# Patient Record
Sex: Female | Born: 1960
Health system: Southern US, Community
[De-identification: ages and names within clinical notes are randomized; demographics above are authoritative.]

## PROBLEM LIST (undated history)

## (undated) DIAGNOSIS — Z972 Presence of dental prosthetic device (complete) (partial): Secondary | ICD-10-CM

## (undated) DIAGNOSIS — Z860101 Personal history of adenomatous and serrated colon polyps: Secondary | ICD-10-CM

## (undated) DIAGNOSIS — R112 Nausea with vomiting, unspecified: Secondary | ICD-10-CM

## (undated) DIAGNOSIS — K219 Gastro-esophageal reflux disease without esophagitis: Secondary | ICD-10-CM

## (undated) DIAGNOSIS — Z973 Presence of spectacles and contact lenses: Secondary | ICD-10-CM

## (undated) DIAGNOSIS — Z8659 Personal history of other mental and behavioral disorders: Secondary | ICD-10-CM

## (undated) DIAGNOSIS — G5603 Carpal tunnel syndrome, bilateral upper limbs: Secondary | ICD-10-CM

## (undated) DIAGNOSIS — F4000 Agoraphobia, unspecified: Secondary | ICD-10-CM

## (undated) DIAGNOSIS — I10 Essential (primary) hypertension: Secondary | ICD-10-CM

## (undated) DIAGNOSIS — R3915 Urgency of urination: Secondary | ICD-10-CM

## (undated) DIAGNOSIS — R319 Hematuria, unspecified: Secondary | ICD-10-CM

## (undated) DIAGNOSIS — Z86010 Personal history of colonic polyps: Secondary | ICD-10-CM

## (undated) DIAGNOSIS — F411 Generalized anxiety disorder: Secondary | ICD-10-CM

## (undated) DIAGNOSIS — Z9889 Other specified postprocedural states: Secondary | ICD-10-CM

## (undated) DIAGNOSIS — Z86018 Personal history of other benign neoplasm: Secondary | ICD-10-CM

## (undated) DIAGNOSIS — Z87820 Personal history of traumatic brain injury: Secondary | ICD-10-CM

## (undated) DIAGNOSIS — R350 Frequency of micturition: Secondary | ICD-10-CM

## (undated) DIAGNOSIS — D573 Sickle-cell trait: Secondary | ICD-10-CM

## (undated) DIAGNOSIS — H5440 Blindness, one eye, unspecified eye: Secondary | ICD-10-CM

## (undated) DIAGNOSIS — F329 Major depressive disorder, single episode, unspecified: Secondary | ICD-10-CM

## (undated) DIAGNOSIS — M1990 Unspecified osteoarthritis, unspecified site: Secondary | ICD-10-CM

## (undated) DIAGNOSIS — K08109 Complete loss of teeth, unspecified cause, unspecified class: Secondary | ICD-10-CM

## (undated) DIAGNOSIS — F32A Depression, unspecified: Secondary | ICD-10-CM

## (undated) DIAGNOSIS — G894 Chronic pain syndrome: Secondary | ICD-10-CM

## (undated) DIAGNOSIS — N201 Calculus of ureter: Secondary | ICD-10-CM

## (undated) HISTORY — DX: Gastro-esophageal reflux disease without esophagitis: K21.9

## (undated) HISTORY — PX: TOTAL ABDOMINAL HYSTERECTOMY W/ BILATERAL SALPINGOOPHORECTOMY: SHX83

## (undated) HISTORY — DX: Depression, unspecified: F32.A

## (undated) HISTORY — DX: Major depressive disorder, single episode, unspecified: F32.9

## (undated) HISTORY — PX: COLONOSCOPY: SHX174

## (undated) HISTORY — PX: ESOPHAGOGASTRODUODENOSCOPY: SHX1529

## (undated) HISTORY — PX: TUBAL LIGATION: SHX77

## (undated) HISTORY — PX: OTHER SURGICAL HISTORY: SHX169

## (undated) HISTORY — DX: Chronic pain syndrome: G89.4

## (undated) HISTORY — DX: Carpal tunnel syndrome, bilateral upper limbs: G56.03

## (undated) HISTORY — DX: Sickle-cell trait: D57.3

## (undated) HISTORY — PX: CATARACT EXTRACTION: SUR2

---

## 1998-06-05 ENCOUNTER — Other Ambulatory Visit: Admission: RE | Admit: 1998-06-05 | Discharge: 1998-06-05 | Payer: Self-pay | Admitting: Obstetrics

## 1998-11-01 ENCOUNTER — Encounter: Payer: Self-pay | Admitting: Emergency Medicine

## 1998-11-01 ENCOUNTER — Emergency Department (HOSPITAL_COMMUNITY): Admission: EM | Admit: 1998-11-01 | Discharge: 1998-11-01 | Payer: Self-pay | Admitting: Emergency Medicine

## 1999-08-13 ENCOUNTER — Emergency Department (HOSPITAL_COMMUNITY): Admission: EM | Admit: 1999-08-13 | Discharge: 1999-08-13 | Payer: Self-pay | Admitting: *Deleted

## 1999-12-24 HISTORY — PX: HEAD & NECK WOUND REPAIR / CLOSURE: SUR1142

## 2000-06-16 ENCOUNTER — Inpatient Hospital Stay (HOSPITAL_COMMUNITY): Admission: AD | Admit: 2000-06-16 | Discharge: 2000-06-16 | Payer: Self-pay | Admitting: Obstetrics

## 2000-06-17 ENCOUNTER — Other Ambulatory Visit: Admission: RE | Admit: 2000-06-17 | Discharge: 2000-06-17 | Payer: Self-pay | Admitting: Obstetrics

## 2000-06-19 ENCOUNTER — Ambulatory Visit (HOSPITAL_COMMUNITY): Admission: RE | Admit: 2000-06-19 | Discharge: 2000-06-19 | Payer: Self-pay | Admitting: Obstetrics

## 2000-06-19 ENCOUNTER — Encounter: Payer: Self-pay | Admitting: Obstetrics

## 2001-03-21 ENCOUNTER — Encounter: Payer: Self-pay | Admitting: Emergency Medicine

## 2001-03-21 ENCOUNTER — Emergency Department (HOSPITAL_COMMUNITY): Admission: EM | Admit: 2001-03-21 | Discharge: 2001-03-22 | Payer: Self-pay | Admitting: Emergency Medicine

## 2001-03-25 ENCOUNTER — Other Ambulatory Visit: Admission: RE | Admit: 2001-03-25 | Discharge: 2001-03-25 | Payer: Self-pay | Admitting: Obstetrics

## 2001-08-07 ENCOUNTER — Encounter: Payer: Self-pay | Admitting: Internal Medicine

## 2001-08-07 ENCOUNTER — Ambulatory Visit (HOSPITAL_COMMUNITY): Admission: RE | Admit: 2001-08-07 | Discharge: 2001-08-07 | Payer: Self-pay | Admitting: Internal Medicine

## 2002-04-19 ENCOUNTER — Ambulatory Visit (HOSPITAL_COMMUNITY): Admission: RE | Admit: 2002-04-19 | Discharge: 2002-04-19 | Payer: Self-pay | Admitting: Family Medicine

## 2003-03-15 ENCOUNTER — Emergency Department (HOSPITAL_COMMUNITY): Admission: EM | Admit: 2003-03-15 | Discharge: 2003-03-15 | Payer: Self-pay | Admitting: Emergency Medicine

## 2003-03-15 ENCOUNTER — Encounter: Payer: Self-pay | Admitting: Emergency Medicine

## 2003-06-15 ENCOUNTER — Emergency Department (HOSPITAL_COMMUNITY): Admission: EM | Admit: 2003-06-15 | Discharge: 2003-06-15 | Payer: Self-pay | Admitting: Emergency Medicine

## 2003-11-08 ENCOUNTER — Emergency Department (HOSPITAL_COMMUNITY): Admission: AD | Admit: 2003-11-08 | Discharge: 2003-11-08 | Payer: Self-pay | Admitting: Family Medicine

## 2004-01-14 ENCOUNTER — Inpatient Hospital Stay (HOSPITAL_COMMUNITY): Admission: AD | Admit: 2004-01-14 | Discharge: 2004-01-14 | Payer: Self-pay | Admitting: Obstetrics

## 2004-05-01 ENCOUNTER — Emergency Department (HOSPITAL_COMMUNITY): Admission: EM | Admit: 2004-05-01 | Discharge: 2004-05-01 | Payer: Self-pay

## 2004-05-14 ENCOUNTER — Ambulatory Visit (HOSPITAL_COMMUNITY): Admission: RE | Admit: 2004-05-14 | Discharge: 2004-05-14 | Payer: Self-pay | Admitting: Obstetrics

## 2004-08-21 ENCOUNTER — Emergency Department (HOSPITAL_COMMUNITY): Admission: EM | Admit: 2004-08-21 | Discharge: 2004-08-21 | Payer: Self-pay | Admitting: Emergency Medicine

## 2004-10-23 ENCOUNTER — Emergency Department (HOSPITAL_COMMUNITY): Admission: EM | Admit: 2004-10-23 | Discharge: 2004-10-23 | Payer: Self-pay | Admitting: Family Medicine

## 2005-01-25 ENCOUNTER — Emergency Department (HOSPITAL_COMMUNITY): Admission: EM | Admit: 2005-01-25 | Discharge: 2005-01-25 | Payer: Self-pay | Admitting: Family Medicine

## 2005-03-07 ENCOUNTER — Encounter (INDEPENDENT_AMBULATORY_CARE_PROVIDER_SITE_OTHER): Payer: Self-pay | Admitting: *Deleted

## 2005-03-07 ENCOUNTER — Ambulatory Visit (HOSPITAL_COMMUNITY): Admission: RE | Admit: 2005-03-07 | Discharge: 2005-03-07 | Payer: Self-pay | Admitting: Orthopedic Surgery

## 2005-03-07 ENCOUNTER — Ambulatory Visit (HOSPITAL_BASED_OUTPATIENT_CLINIC_OR_DEPARTMENT_OTHER): Admission: RE | Admit: 2005-03-07 | Discharge: 2005-03-07 | Payer: Self-pay | Admitting: Orthopedic Surgery

## 2005-03-07 HISTORY — PX: OTHER SURGICAL HISTORY: SHX169

## 2005-05-23 ENCOUNTER — Ambulatory Visit (HOSPITAL_COMMUNITY): Admission: RE | Admit: 2005-05-23 | Discharge: 2005-05-23 | Payer: Self-pay | Admitting: Obstetrics

## 2005-07-18 ENCOUNTER — Emergency Department (HOSPITAL_COMMUNITY): Admission: EM | Admit: 2005-07-18 | Discharge: 2005-07-18 | Payer: Self-pay | Admitting: Emergency Medicine

## 2005-08-05 ENCOUNTER — Emergency Department (HOSPITAL_COMMUNITY): Admission: EM | Admit: 2005-08-05 | Discharge: 2005-08-05 | Payer: Self-pay | Admitting: Family Medicine

## 2005-10-14 ENCOUNTER — Inpatient Hospital Stay (HOSPITAL_COMMUNITY): Admission: AD | Admit: 2005-10-14 | Discharge: 2005-10-14 | Payer: Self-pay | Admitting: Obstetrics

## 2005-10-16 ENCOUNTER — Inpatient Hospital Stay (HOSPITAL_COMMUNITY): Admission: AD | Admit: 2005-10-16 | Discharge: 2005-10-16 | Payer: Self-pay | Admitting: Obstetrics and Gynecology

## 2005-10-18 ENCOUNTER — Ambulatory Visit (HOSPITAL_COMMUNITY): Admission: RE | Admit: 2005-10-18 | Discharge: 2005-10-18 | Payer: Self-pay | Admitting: Obstetrics and Gynecology

## 2005-11-05 ENCOUNTER — Encounter (INDEPENDENT_AMBULATORY_CARE_PROVIDER_SITE_OTHER): Payer: Self-pay | Admitting: Specialist

## 2005-11-05 ENCOUNTER — Ambulatory Visit: Payer: Self-pay | Admitting: *Deleted

## 2005-11-05 ENCOUNTER — Other Ambulatory Visit: Admission: RE | Admit: 2005-11-05 | Discharge: 2005-11-05 | Payer: Self-pay | Admitting: *Deleted

## 2005-12-05 ENCOUNTER — Ambulatory Visit: Payer: Self-pay | Admitting: *Deleted

## 2005-12-23 HISTORY — PX: ENDOMETRIAL ABLATION: SHX621

## 2005-12-29 ENCOUNTER — Emergency Department (HOSPITAL_COMMUNITY): Admission: EM | Admit: 2005-12-29 | Discharge: 2005-12-29 | Payer: Self-pay | Admitting: Emergency Medicine

## 2006-01-02 ENCOUNTER — Emergency Department (HOSPITAL_COMMUNITY): Admission: EM | Admit: 2006-01-02 | Discharge: 2006-01-02 | Payer: Self-pay | Admitting: Family Medicine

## 2006-03-26 ENCOUNTER — Ambulatory Visit: Payer: Self-pay | Admitting: Obstetrics & Gynecology

## 2006-04-01 ENCOUNTER — Ambulatory Visit (HOSPITAL_COMMUNITY): Admission: RE | Admit: 2006-04-01 | Discharge: 2006-04-01 | Payer: Self-pay | Admitting: Obstetrics and Gynecology

## 2006-05-28 ENCOUNTER — Ambulatory Visit: Payer: Self-pay | Admitting: Obstetrics & Gynecology

## 2006-06-03 ENCOUNTER — Ambulatory Visit (HOSPITAL_COMMUNITY): Admission: RE | Admit: 2006-06-03 | Discharge: 2006-06-03 | Payer: Self-pay | Admitting: Obstetrics and Gynecology

## 2006-07-01 ENCOUNTER — Ambulatory Visit: Payer: Self-pay | Admitting: Obstetrics & Gynecology

## 2006-07-01 ENCOUNTER — Ambulatory Visit (HOSPITAL_BASED_OUTPATIENT_CLINIC_OR_DEPARTMENT_OTHER): Admission: RE | Admit: 2006-07-01 | Discharge: 2006-07-01 | Payer: Self-pay | Admitting: Obstetrics & Gynecology

## 2006-07-11 ENCOUNTER — Ambulatory Visit: Payer: Self-pay | Admitting: Gynecology

## 2006-07-28 ENCOUNTER — Emergency Department (HOSPITAL_COMMUNITY): Admission: EM | Admit: 2006-07-28 | Discharge: 2006-07-28 | Payer: Self-pay | Admitting: Family Medicine

## 2006-08-14 ENCOUNTER — Ambulatory Visit: Payer: Self-pay | Admitting: Obstetrics & Gynecology

## 2006-11-07 ENCOUNTER — Emergency Department (HOSPITAL_COMMUNITY): Admission: EM | Admit: 2006-11-07 | Discharge: 2006-11-07 | Payer: Self-pay | Admitting: Family Medicine

## 2007-03-17 ENCOUNTER — Emergency Department (HOSPITAL_COMMUNITY): Admission: EM | Admit: 2007-03-17 | Discharge: 2007-03-17 | Payer: Self-pay | Admitting: Emergency Medicine

## 2007-06-08 ENCOUNTER — Ambulatory Visit (HOSPITAL_COMMUNITY): Admission: RE | Admit: 2007-06-08 | Discharge: 2007-06-08 | Payer: Self-pay | Admitting: Obstetrics and Gynecology

## 2007-09-11 ENCOUNTER — Ambulatory Visit: Payer: Self-pay | Admitting: *Deleted

## 2007-09-11 ENCOUNTER — Encounter: Payer: Self-pay | Admitting: Obstetrics & Gynecology

## 2008-06-09 ENCOUNTER — Ambulatory Visit (HOSPITAL_COMMUNITY): Admission: RE | Admit: 2008-06-09 | Discharge: 2008-06-09 | Payer: Self-pay | Admitting: Gynecology

## 2008-06-15 ENCOUNTER — Emergency Department (HOSPITAL_COMMUNITY): Admission: EM | Admit: 2008-06-15 | Discharge: 2008-06-15 | Payer: Self-pay | Admitting: Family Medicine

## 2009-02-15 ENCOUNTER — Ambulatory Visit: Payer: Self-pay | Admitting: Obstetrics and Gynecology

## 2009-02-15 ENCOUNTER — Encounter: Payer: Self-pay | Admitting: Obstetrics and Gynecology

## 2009-07-10 ENCOUNTER — Emergency Department (HOSPITAL_COMMUNITY): Admission: EM | Admit: 2009-07-10 | Discharge: 2009-07-10 | Payer: Self-pay | Admitting: Emergency Medicine

## 2009-09-21 ENCOUNTER — Emergency Department (HOSPITAL_COMMUNITY): Admission: EM | Admit: 2009-09-21 | Discharge: 2009-09-21 | Payer: Self-pay | Admitting: Family Medicine

## 2009-09-28 ENCOUNTER — Ambulatory Visit: Payer: Self-pay | Admitting: Family Medicine

## 2009-09-28 DIAGNOSIS — R519 Headache, unspecified: Secondary | ICD-10-CM | POA: Insufficient documentation

## 2009-09-28 DIAGNOSIS — D573 Sickle-cell trait: Secondary | ICD-10-CM

## 2009-09-28 DIAGNOSIS — R51 Headache: Secondary | ICD-10-CM

## 2009-09-28 HISTORY — DX: Sickle-cell trait: D57.3

## 2009-10-13 ENCOUNTER — Ambulatory Visit: Payer: Self-pay | Admitting: Family Medicine

## 2009-10-13 ENCOUNTER — Telehealth: Payer: Self-pay | Admitting: Family Medicine

## 2009-12-21 ENCOUNTER — Emergency Department (HOSPITAL_COMMUNITY): Admission: EM | Admit: 2009-12-21 | Discharge: 2009-12-22 | Payer: Self-pay | Admitting: Emergency Medicine

## 2010-02-15 ENCOUNTER — Encounter: Payer: Self-pay | Admitting: Family Medicine

## 2010-03-16 ENCOUNTER — Telehealth: Payer: Self-pay | Admitting: *Deleted

## 2010-03-29 ENCOUNTER — Emergency Department (HOSPITAL_COMMUNITY): Admission: EM | Admit: 2010-03-29 | Discharge: 2010-03-29 | Payer: Self-pay | Admitting: Family Medicine

## 2010-08-09 ENCOUNTER — Emergency Department (HOSPITAL_COMMUNITY): Admission: EM | Admit: 2010-08-09 | Discharge: 2010-08-09 | Payer: Self-pay | Admitting: Emergency Medicine

## 2010-08-10 ENCOUNTER — Ambulatory Visit: Payer: Self-pay | Admitting: Family Medicine

## 2010-08-10 ENCOUNTER — Encounter: Payer: Self-pay | Admitting: Family Medicine

## 2010-08-13 ENCOUNTER — Telehealth: Payer: Self-pay | Admitting: *Deleted

## 2010-08-15 ENCOUNTER — Ambulatory Visit: Payer: Self-pay | Admitting: Family Medicine

## 2010-08-15 ENCOUNTER — Encounter: Payer: Self-pay | Admitting: Family Medicine

## 2010-08-16 ENCOUNTER — Telehealth: Payer: Self-pay | Admitting: *Deleted

## 2010-08-20 ENCOUNTER — Telehealth (INDEPENDENT_AMBULATORY_CARE_PROVIDER_SITE_OTHER): Payer: Self-pay | Admitting: *Deleted

## 2010-08-20 ENCOUNTER — Telehealth: Payer: Self-pay | Admitting: Family Medicine

## 2010-08-21 ENCOUNTER — Emergency Department (HOSPITAL_COMMUNITY): Admission: EM | Admit: 2010-08-21 | Discharge: 2010-08-21 | Payer: Self-pay | Admitting: Emergency Medicine

## 2010-08-21 ENCOUNTER — Encounter: Payer: Self-pay | Admitting: Family Medicine

## 2010-09-04 ENCOUNTER — Encounter
Admission: RE | Admit: 2010-09-04 | Discharge: 2010-12-03 | Payer: Self-pay | Source: Home / Self Care | Attending: Family Medicine | Admitting: Family Medicine

## 2010-09-06 ENCOUNTER — Ambulatory Visit: Payer: Self-pay | Admitting: Family Medicine

## 2010-09-13 ENCOUNTER — Telehealth: Payer: Self-pay | Admitting: Family Medicine

## 2010-09-17 ENCOUNTER — Telehealth: Payer: Self-pay | Admitting: *Deleted

## 2010-09-20 ENCOUNTER — Encounter: Payer: Self-pay | Admitting: Family Medicine

## 2010-09-27 ENCOUNTER — Encounter: Payer: Self-pay | Admitting: Family Medicine

## 2010-09-27 ENCOUNTER — Ambulatory Visit: Payer: Self-pay | Admitting: Family Medicine

## 2010-09-27 DIAGNOSIS — R42 Dizziness and giddiness: Secondary | ICD-10-CM

## 2010-09-27 DIAGNOSIS — M25519 Pain in unspecified shoulder: Secondary | ICD-10-CM

## 2010-09-27 LAB — CONVERTED CEMR LAB
CO2: 29 meq/L (ref 19–32)
Calcium: 9.1 mg/dL (ref 8.4–10.5)
Cocaine Metabolites: NEGATIVE
Eosinophils Relative: 3 % (ref 0–5)
Glucose, Bld: 86 mg/dL (ref 70–99)
HCT: 39.1 % (ref 36.0–46.0)
Lymphocytes Relative: 32 % (ref 12–46)
Lymphs Abs: 1.9 10*3/uL (ref 0.7–4.0)
Marijuana Metabolite: NEGATIVE
Opiates: NEGATIVE
Phencyclidine (PCP): NEGATIVE
Platelets: 394 10*3/uL (ref 150–400)
Potassium: 4.2 meq/L (ref 3.5–5.3)
RBC: 4.26 M/uL (ref 3.87–5.11)
Sodium: 139 meq/L (ref 135–145)
WBC: 6 10*3/uL (ref 4.0–10.5)

## 2010-10-02 ENCOUNTER — Telehealth: Payer: Self-pay | Admitting: Family Medicine

## 2010-10-04 ENCOUNTER — Encounter: Admission: RE | Admit: 2010-10-04 | Discharge: 2010-10-04 | Payer: Self-pay | Admitting: Family Medicine

## 2010-10-17 ENCOUNTER — Encounter: Payer: Self-pay | Admitting: Family Medicine

## 2010-10-17 ENCOUNTER — Ambulatory Visit: Payer: Self-pay | Admitting: Family Medicine

## 2010-10-17 ENCOUNTER — Telehealth: Payer: Self-pay | Admitting: Psychology

## 2010-10-17 DIAGNOSIS — F329 Major depressive disorder, single episode, unspecified: Secondary | ICD-10-CM

## 2010-10-25 ENCOUNTER — Encounter: Admission: RE | Admit: 2010-10-25 | Discharge: 2010-10-25 | Payer: Self-pay | Admitting: Neurology

## 2010-10-29 ENCOUNTER — Ambulatory Visit: Payer: Self-pay | Admitting: Obstetrics and Gynecology

## 2010-10-29 ENCOUNTER — Ambulatory Visit (HOSPITAL_COMMUNITY): Admission: RE | Admit: 2010-10-29 | Discharge: 2010-10-29 | Payer: Self-pay | Admitting: Emergency Medicine

## 2010-10-29 ENCOUNTER — Other Ambulatory Visit: Admission: RE | Admit: 2010-10-29 | Discharge: 2010-10-29 | Payer: Self-pay | Admitting: Obstetrics and Gynecology

## 2010-10-30 ENCOUNTER — Ambulatory Visit: Payer: Self-pay | Admitting: Psychology

## 2010-11-08 ENCOUNTER — Encounter: Payer: Self-pay | Admitting: Family Medicine

## 2010-11-09 ENCOUNTER — Ambulatory Visit (HOSPITAL_COMMUNITY)
Admission: RE | Admit: 2010-11-09 | Discharge: 2010-11-09 | Payer: Self-pay | Source: Home / Self Care | Admitting: Family Medicine

## 2010-11-09 ENCOUNTER — Encounter: Payer: Self-pay | Admitting: Family Medicine

## 2010-11-12 ENCOUNTER — Ambulatory Visit: Payer: Self-pay | Admitting: Psychology

## 2010-11-12 ENCOUNTER — Encounter: Payer: Self-pay | Admitting: Family Medicine

## 2010-11-12 ENCOUNTER — Ambulatory Visit: Payer: Self-pay | Admitting: Family Medicine

## 2010-11-13 ENCOUNTER — Encounter: Payer: Self-pay | Admitting: Family Medicine

## 2010-11-13 ENCOUNTER — Telehealth: Payer: Self-pay | Admitting: *Deleted

## 2010-11-13 ENCOUNTER — Telehealth (INDEPENDENT_AMBULATORY_CARE_PROVIDER_SITE_OTHER): Payer: Self-pay | Admitting: Family Medicine

## 2010-11-20 ENCOUNTER — Encounter (INDEPENDENT_AMBULATORY_CARE_PROVIDER_SITE_OTHER): Payer: Self-pay | Admitting: *Deleted

## 2010-11-21 ENCOUNTER — Encounter (INDEPENDENT_AMBULATORY_CARE_PROVIDER_SITE_OTHER): Payer: Self-pay | Admitting: *Deleted

## 2010-11-22 ENCOUNTER — Telehealth: Payer: Self-pay | Admitting: Family Medicine

## 2010-11-27 ENCOUNTER — Encounter: Payer: Self-pay | Admitting: Family Medicine

## 2010-11-28 ENCOUNTER — Telehealth: Payer: Self-pay | Admitting: Family Medicine

## 2010-11-29 ENCOUNTER — Encounter: Payer: Self-pay | Admitting: Family Medicine

## 2010-12-05 ENCOUNTER — Telehealth (INDEPENDENT_AMBULATORY_CARE_PROVIDER_SITE_OTHER): Payer: Self-pay | Admitting: Family Medicine

## 2010-12-07 ENCOUNTER — Ambulatory Visit: Payer: Self-pay | Admitting: Obstetrics and Gynecology

## 2010-12-10 ENCOUNTER — Telehealth: Payer: Self-pay | Admitting: Family Medicine

## 2010-12-11 ENCOUNTER — Telehealth: Payer: Self-pay | Admitting: *Deleted

## 2010-12-18 ENCOUNTER — Encounter: Payer: Self-pay | Admitting: Family Medicine

## 2010-12-19 ENCOUNTER — Ambulatory Visit: Payer: Self-pay | Admitting: Family Medicine

## 2010-12-19 DIAGNOSIS — R252 Cramp and spasm: Secondary | ICD-10-CM

## 2010-12-20 ENCOUNTER — Encounter: Payer: Self-pay | Admitting: Family Medicine

## 2010-12-23 DIAGNOSIS — G5603 Carpal tunnel syndrome, bilateral upper limbs: Secondary | ICD-10-CM

## 2010-12-23 HISTORY — DX: Carpal tunnel syndrome, bilateral upper limbs: G56.03

## 2010-12-26 ENCOUNTER — Telehealth: Payer: Self-pay | Admitting: Family Medicine

## 2010-12-27 ENCOUNTER — Encounter: Payer: Self-pay | Admitting: Family Medicine

## 2011-01-08 ENCOUNTER — Telehealth: Payer: Self-pay | Admitting: Family Medicine

## 2011-01-09 ENCOUNTER — Telehealth: Payer: Self-pay | Admitting: Family Medicine

## 2011-01-09 ENCOUNTER — Encounter: Payer: Self-pay | Admitting: Family Medicine

## 2011-01-09 DIAGNOSIS — R443 Hallucinations, unspecified: Secondary | ICD-10-CM | POA: Insufficient documentation

## 2011-01-11 ENCOUNTER — Ambulatory Visit: Admission: RE | Admit: 2011-01-11 | Discharge: 2011-01-11 | Payer: Self-pay | Source: Home / Self Care

## 2011-01-13 ENCOUNTER — Encounter: Payer: Self-pay | Admitting: *Deleted

## 2011-01-14 ENCOUNTER — Encounter: Payer: Self-pay | Admitting: *Deleted

## 2011-01-15 ENCOUNTER — Encounter: Payer: Self-pay | Admitting: Family Medicine

## 2011-01-15 ENCOUNTER — Telehealth: Payer: Self-pay | Admitting: *Deleted

## 2011-01-16 ENCOUNTER — Encounter: Payer: Self-pay | Admitting: Family Medicine

## 2011-01-16 DIAGNOSIS — R011 Cardiac murmur, unspecified: Secondary | ICD-10-CM | POA: Insufficient documentation

## 2011-01-21 ENCOUNTER — Encounter: Payer: Self-pay | Admitting: Family Medicine

## 2011-01-22 NOTE — Progress Notes (Signed)
Summary: referral prob  Phone Note Call from Patient Call back at [phone]   Caller: Patient Summary of Call: needs to talk about referral for PT Initial call taken by: De Nurse,  August 20, 2010 2:32 PM  Follow-up for Phone Call        advised  patient it appears PT referral has not been sent. will send  today. Follow-up by: Theresia Lo RN,  August 20, 2010 3:12 PM
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
## 2011-01-22 NOTE — Miscellaneous (Signed)
Summary: MR of head   Clinical Lists Changes  MR Brain Without & With Contrast Media - STATUS: Final  IMAGE                                     Perform Date: 13Oct11 21:07  Ordered By: Lanier Prude  ,         Ordered Date: 13Oct11 19:41  Facility: [facility]                              Department: MRI  Service Report Text  DRI Accession Number: [id number]      Clinical Data: Dizziness.    MRI HEAD WITHOUT AND WITH CONTRAST    Technique:  Multiplanar, multiecho pulse sequences of the brain and   surrounding structures were obtained according to standard protocol   without and with intravenous contrast    Contrast: 16 ml Multihance IV    Comparison: None    Findings: Ventricle size is normal.  Negative for acute infarct.   Scattered small hyperintensities  in the frontal white matter   bilaterally, likely related to chronic ischemia or migraine   headaches.  Not typical for multiple sclerosis.  Negative for   hemorrhage or mass.    Postcontrast images of  the brain reveal normal enhancement.    IMPRESSION:   Small hyperintensities in the frontal white matter bilaterally   appear chronic and may be due to chronic ischemia or migraine   headaches.  No acute intracranial abnormality.    Read By:  Camelia Phenes,  M.D.   Released By:  Camelia Phenes,  M.D.  Additional Information  HL7 RESULT STATUS : F  External image : [id number]  External IF Update Timestamp : 2010-10-05:10:17:01.000000

## 2011-01-22 NOTE — Assessment & Plan Note (Signed)
Summary: Behavioral Medicine Follow-up   Primary Care Megan Kemp:  Megan Mayhew, MD   History of Present Illness: Patient did not have anything she wished to talk about.  She said she was a private person and has trouble opening up.  She didn't note anything that was bothering her other than headache pain.  She answered her cell phone twice during our visit.  Allergies: 1)  ! Codeine   Impression & Recommendations:  Problem # 1:  DEPRESSIVE DISORDER NOT ELSEWHERE CLASSIFIED (ICD-311) I provided feedback that it did not seem like therapy was a priority for her based on the following things:  - Her reported reason for coming to therapy was because Dr. Edmonia Kemp asked her to. - She has nothing idenitified to discuss or to work on and can not come up with anything when asked. 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  With this feedback, she assured me that therapy was a priority and despite her cell phone ringing multiple times, she did not answer it again.  I also addressed her difficulty opening up.  She said she felt comfortable with me and did not have any suggestions as to how I could help her open up.  With questioning, I learned that her significant other thinks stress is causing her symptoms.  When asked to tell me what she thought about that, she said she stays fairly relaxed through meditation.  I attempted to look at function to see if there are things that she could be pushing herself to do despite her symptoms.  There was not room for that (the dizziness, black spots and head pain prevent her from doing anything except listening to the radio).  I thought I would talk with Dr. Edmonia Kemp to see what possible explanations exist for her symptoms.  I could offer something very behavioral like relaxation training but it sounds as though she already succesfully meditates.  If Megan Kemp identifies something to work on in  therapy I would be happy to meet with her.  In the absence of that and based on my attempts thus far, there is not much to do for her.  Will also talk with Dr. Edmonia Kemp to see if there are specific things she would like [place] to work on.  Perhaps I can get Megan Kemp bought in that way.  Follow-up is pending. Her updated medication list for this problem includes:    Zoloft 25 Mg Tabs (Sertraline hcl) .Marland Kitchen... Take 1 tablet daily  Orders: Therapy 40-50- min- [facility] (69629)  Complete Medication List: 1)  Naprosyn 500 Mg Tabs (Naproxen) .Marland Kitchen.. 1 by mouth two times a day as needed pain 2)  Promethazine Hcl 25 Mg Tabs (Promethazine hcl) .Marland Kitchen.. 1 tab by mouth q 6hrs as needed dizziness 3)  Zoloft 25 Mg Tabs (Sertraline hcl) .... Take 1 tablet daily 4)  Topamax 25 Mg Tabs (Topiramate) .... Take 1 per day- pt states she is only taking 1/2 tab per day 5)  Tramadol Hcl 50 Mg Tabs (Tramadol hcl) .... Take 1 tablet three times a day prn   Orders Added: 1)  Therapy 40-50- min- [facility] [[id number]]

## 2011-01-22 NOTE — Miscellaneous (Signed)
Summary: medical record request  Clinical Lists Changes  Rec'd medical record request to go to: [facility] date sent:10/10/10 Marily Memos  November 20, 2010 1:50 PM

## 2011-01-22 NOTE — Letter (Signed)
Summary: Out of Work  [facility]  [address]   [place], [place] [address]   Phone: [phone]  Fax: [phone]    October 17, 2010   Employee:  JEN BENEDICT    To Whom It May Concern:   For Medical reasons, please excuse the above named employee from work for the following dates:  Start:   October 17 2010  I am unsure of when pt will be able to return to work.  She has an appt with the neurologist tomorrow for further input on prognosis.    If you need additional information, please feel free to contact our office.         Sincerely,    Ellin Mayhew MD
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
## 2011-01-22 NOTE — Miscellaneous (Signed)
  Clinical Lists Changes  Problems: Removed problem of BENIGN POSITIONAL VERTIGO (ICD-386.11) Removed problem of CRAMP OF LIMB (ICD-729.82) Removed problem of PREVENTIVE HEALTH CARE (ICD-V70.0)      Allergies: 1)  ! Codeine

## 2011-01-22 NOTE — Consult Note (Signed)
Summary: [facility]   Imported By: Bradly Bienenstock 03/29/2010 09:07:28  _____________________________________________________________________  External Attachment:    Type:   Image     Comment:   External Document  Appended Document: [facility] Unable to read handwriting on note.  Called office for clarification of findings and recommendation.  They are to call back with more information after they pull her chart.  Betsy Coder, MD 03/01/10

## 2011-01-22 NOTE — Progress Notes (Signed)
Summary: Handicap placard  Phone Note Call from Patient   Caller: Patient Call For: [phone] Summary of Call: Megan Kemp wanted to know if you could fill out a Handicap Placard form for her and call to have it picked up when ready. Initial call taken by: Abundio Miu,  November 22, 2010 10:03 AM  Follow-up for Phone Call        completed today.  pt called by [name] to notify that it is complete.  Ellin Mayhew MD  November 26, 2010 4:38 PM

## 2011-01-22 NOTE — Letter (Signed)
Summary: FMLA  FMLA   Imported By: Abundio Miu 08/21/2010 10:14:21  _____________________________________________________________________  External Attachment:    Type:   Image     Comment:   External Document

## 2011-01-22 NOTE — Progress Notes (Signed)
Summary: vestibular PT/TS  ---- Converted from flag ---- ---- 08/15/2010 9:29 PM, Milinda Antis MD wrote: Pt needs referral to neuro and vestiubarl PT ------------------------------  faxed..done.

## 2011-01-22 NOTE — Consult Note (Signed)
Summary: [facility]   Imported By: Marily Memos 11/22/2010 11:00:03  _____________________________________________________________________  External Attachment:    Type:   Image     Comment:   External Document

## 2011-01-22 NOTE — Progress Notes (Signed)
Summary: Schedule Initial Beh Med  Phone Note Call from Patient   Caller: Patient Call For: Spero Geralds, Psy.D. Summary of Call: Patient called to schedule an appt per Dr. Edmonia James' reccomendation.  Scheduled for November 8th at 3:00. Initial call taken by: Spero Geralds PsyD,  October 17, 2010 4:41 PM

## 2011-01-22 NOTE — Miscellaneous (Signed)
Summary: Medical record request  Clinical Lists Changes  Rec'd medical record request to go to: Disability determination services Date sent: 09/11/10 Marily Memos  November 21, 2010 9:02 AM

## 2011-01-22 NOTE — Letter (Signed)
Summary: FMLA  FMLA   Imported By: Clydell Hakim 08/14/2010 10:30:53  _____________________________________________________________________  External Attachment:    Type:   Image     Comment:   External Document

## 2011-01-22 NOTE — Assessment & Plan Note (Signed)
Summary: dizziness/headache/right shoulder pain   Vital Signs:  Patient profile:   50 year old female Weight:      179 pounds Temp:     97.5 degrees F oral Pulse rate:   87 / minute Pulse rhythm:   regular BP sitting:   116 / 74  (right arm) Cuff size:   regular  Vitals Entered By: Loralee Pacas CMA (September 27, 2010 10:52 AM)   Primary Care Provider:  Ellin Mayhew, MD   History of Present Illness: Dizziness: therapy has not helped, actually getting worse.  feels like room is spining constantly and sees black spots constantly.  Nothing helps it improve.   continued walker has been helping her feel more steady.  Still seeing black spots everywhere. fell last week because of dizziness and hit above left eye, no syncope from this event.  Has an appt with neurology on 10/27.   H/A: pt states she has had a constant h/a x 1 month.  It never goes away.  the pain pills help the headache. improves the pain but never goes away.  +nausea.  + vomiting. daily with any by mouth intake.    right shoulder pain: seen as [facility] 2 months ago and told that it was a muscle spasm.  pain is worse at night.  Pain in right shoulder improves with naprosyn.  but comes right back.   Habits & Providers  Alcohol-Tobacco-Diet     Alcohol drinks/day: 0     Tobacco Status: never  Current Medications (verified): 1)  Antivert 25 Mg Tabs (Meclizine Hcl) .Marland Kitchen.. 1 Tab By Mouth Q 6 Hrs As Needed Dizziness 2)  Naprosyn 500 Mg Tabs (Naproxen) .Marland Kitchen.. 1 By Mouth Two Times A Day As Needed Pain 3)  Promethazine Hcl 25 Mg Tabs (Promethazine Hcl) .Marland Kitchen.. 1 Tab By Mouth Q 6hrs As Needed Dizziness  Allergies (verified): 1)  ! Codeine  Social History: No ETOH, Drugs, or alcohol  Pt lives by herself.  She works as a Herbalist at [employer]  For fun she spends time with Grandkids, also likes to cook.  She has 1 son.    Denies any abuse.  Feels safe at home.  Review of Systems       as per hpi  Physical  Exam  General:  VSS Well-developed,well-nourished,in no acute distress; alert,appropriate and cooperative throughout examination Eyes:  No corneal or conjunctival inflammation noted. EOMI. Perrla on right.  Left eye pupil pinpoint, irregular shape s/p eye surgery for cataract age 64.  Ears:  External ear exam shows no significant lesions or deformities.  Otoscopic examination reveals clear canals, tympanic membranes are intact bilaterally without bulging, retraction, inflammation or discharge. Hearing is grossly normal bilaterally. Lungs:  Normal respiratory effort, chest expands symmetrically. Lungs are clear to auscultation, no crackles or wheezes. Heart:  Normal rate and regular rhythm. S1 and S2 normal without gallop, murmur, click, rub or other extra sounds. Neurologic:  CN II-XII grossly intact. slow intentional gait.  gait even.   DTRs are symmetrical throughout. Sensory, motor and coordinative functions appear intact.  No nystagmus noted on dix-hallpike.  No worsening of symptoms with dix-hallpike.   Psych:  Cognition and judgment appear intact. Alert and cooperative with normal attention span and concentration. No apparent delusions, illusions, hallucinations   Impression & Recommendations:  Problem # 1:  DIZZINESS (ICD-780.4) Since dizziness not resolving will order MRI to r/o central lesion cause of dizziness and black spots in vision.  Will also  order BMET to check Cr since pt has been taking 1500mg  of naprosyn per day.  Reminded pt of how to take naprosyn 1 tablet two times a day as needed.  Also BMET to look at electrolytes since pt endorses n/v daily with food.  Pt has had weight gain since last appts.  No weight loss with n/v.  Will obtain CBC with dif, to ensure no elevation in wbc or infectious process.  Also will obtain TSH and RPR for completeness.  Will also order a UDS to ensure no substances have been used.  Pt denies any use.     Her updated medication list for this problem  includes:    Antivert 25 Mg Tabs (Meclizine hcl) .Marland Kitchen... 1 tab by mouth q 6 hrs as needed dizziness    Promethazine Hcl 25 Mg Tabs (Promethazine hcl) .Marland Kitchen... 1 tab by mouth q 6hrs as needed dizziness  Orders: CBC w/Diff-[facility] (81191) Basic Met-[facility] (47829-56213) TSH-[facility] (08657-84696) RPR-[facility] [phone]) MRI with & without Contrast (MRI w&w/o Contrast) MRI with & without Contrast (MRI w&w/o Contrast) Miscellaneous Lab Charge-[facility] (40102) [facility]- Est  Level 4 (72536)  Problem # 2:  HEADACHE (ICD-784.0) See plan as outlined above.  continue to treat with naproxen as directed.  Her updated medication list for this problem includes:    Naprosyn 500 Mg Tabs (Naproxen) .Marland Kitchen... 1 by mouth two times a day as needed pain  Orders: [facility]- Est  Level 4 (64403)  Problem # 3:  SHOULDER PAIN, RIGHT (ICD-719.41) most likely musculoskeletal from physical exam, will continue with naprosyn for pain.  Her updated medication list for this problem includes:    Naprosyn 500 Mg Tabs (Naproxen) .Marland Kitchen... 1 by mouth two times a day as needed pain  Orders: [facility]- Est  Level 4 (99214)  Complete Medication List: 1)  Antivert 25 Mg Tabs (Meclizine hcl) .Marland Kitchen.. 1 tab by mouth q 6 hrs as needed dizziness 2)  Naprosyn 500 Mg Tabs (Naproxen) .Marland Kitchen.. 1 by mouth two times a day as needed pain 3)  Promethazine Hcl 25 Mg Tabs (Promethazine hcl) .Marland Kitchen.. 1 tab by mouth q 6hrs as needed dizziness

## 2011-01-22 NOTE — Progress Notes (Signed)
Summary: phn msg  Phone Note Call from Patient Call back at [facility] Phone [phone]   Caller: Patient Summary of Call: pt is allergic to Tramadol Initial call taken by: De Nurse,  November 13, 2010 11:59 AM  Follow-up for Phone Call        received note from pharmacy that patient stated she has hives with codiene and also has had a reaction to hydrocodone.and she is concerned this will happen with tramadol. consulted Dr. McDiarmid and he sent a note back to pharmacy low likelihood of reaction with tramadol.  spoke with patient and she states she is allergic to codiene. not sure she has ever taken tramadol. advised of Dr. McDiramid message and she is agreeable to take. advised her to stop med if any problem . Follow-up by: Theresia Lo RN,  November 13, 2010 12:28 PM

## 2011-01-22 NOTE — Letter (Signed)
Summary: Out of Work  [facility]  [address]   [place], [place] [address]   Phone: [phone]  Fax: [phone]    August 10, 2010   Employee:  JAZZMIN [employer]    To Whom It May Concern:   For Medical reasons, please excuse the above named employee from work for the following dates:  Start:   Wed, Aug 08, 2010  End:   Tues, Aug 14, 2010  If you need additional information, please feel free to contact our office. FMLA form will accompany this soon.          Sincerely,    Milinda Antis MD
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
## 2011-01-22 NOTE — Progress Notes (Signed)
Summary: phn msg  Phone Note Call from Patient Call back at [phone]   Caller: Patient Summary of Call: has appt w/ [facility] on Wed but wants to talk to doc on Tuesday Initial call taken by: De Nurse,  August 13, 2010 11:12 AM  Follow-up for Phone Call        Please call pt and ask what it is in reference to. If not I will see her tomorrow. I have already completed her FMLA Follow-up by: Milinda Antis MD,  August 14, 2010 2:07 PM  Additional Follow-up for Phone Call Additional follow up Details #1::        called pt lmom to return call. Additional Follow-up by: Tessie Fass CMA,  August 14, 2010 2:13 PM     Appended Document: phn msg pt will talk to [facility] at appt

## 2011-01-22 NOTE — Letter (Signed)
Summary: Out of Work  [facility]  [address]   [place], [place] [address]   Phone: [phone]  Fax: [phone]    August 15, 2010   Employee:  Megan Kemp    To Whom It May Concern: [facility]- [phone])   For Medical reasons, please excuse the above named employee from work for the following dates:   From 08/15/10 until further notice by MD.   If you need additional information, please feel free to contact our office.         Sincerely,    Milinda Antis MD

## 2011-01-22 NOTE — Letter (Signed)
Summary: Disability papers  Disability papers   Imported By: De Nurse 11/09/2010 16:45:24  _____________________________________________________________________  External Attachment:    Type:   Image     Comment:   External Document

## 2011-01-22 NOTE — Progress Notes (Signed)
Summary: refill  Phone Note Refill Request Call back at [phone] Message from:  Patient  Refills Requested: Medication #1:  NAPROSYN 500 MG TABS 1 by mouth two times a day as needed pain Out Pt Pharm  Initial call taken by: De Nurse,  August 20, 2010 2:23 PM    Prescriptions: NAPROSYN 500 MG TABS (NAPROXEN) 1 by mouth two times a day as needed pain  #40 x 0   Entered and Authorized by:   Milinda Antis MD   Signed by:   Milinda Antis MD on 08/20/2010   Method used:   Electronically to        [facility]* (retail)       [address]       [address] Shipping/mailing       [place], [place]  [address]       Ph: [phone]       Fax: [phone]   RxID:   [id number]

## 2011-01-22 NOTE — Progress Notes (Signed)
Summary: need meds  Phone Note Call from Patient Call back at [phone]   Caller: Patient Summary of Call: pt needs valium (1 or 2)  in order to have her MRI since she is claustophobic. appt for MRI is this Thurs 10/13 - pls call her to let her know Out Pt pharm Initial call taken by: De Nurse,  October 02, 2010 11:19 AM  Follow-up for Phone Call        I have written a prescription for lorazepam. Will ask office staff to call pt and let her know that this will need to be picked up since this is a controlled substance.  Please let pt know that our policy here at the clinic for controlled substances is that this medication Rx will not be replaced if lost.  Ellin Mayhew MD  October 03, 2010 11:50 AM     New/Updated Medications: LORAZEPAM 0.5 MG TABS (LORAZEPAM) take 1 tablet 45 prior to procedure, take 2nd tablet if needed for sedation.  do not drive after taking this medication. Prescriptions: LORAZEPAM 0.5 MG TABS (LORAZEPAM) take 1 tablet 45 prior to procedure, take 2nd tablet if needed for sedation.  do not drive after taking this medication.  #2 x 0   Entered and Authorized by:   Ellin Mayhew MD   Signed by:   Ellin Mayhew MD on 10/03/2010   Method used:   Print then Give to Patient   RxID:   [id number]

## 2011-01-22 NOTE — Progress Notes (Signed)
Summary: phn msg  Phone Note From Other Clinic Call back at [phone]   Caller: [facility] Summary of Call: needs a letter stating that pt is not able to drive and when that will be lifted pls fax to [phone] Initial call taken by: De Nurse,  November 28, 2010 11:05 AM  Follow-up for Phone Call        I have completed the requested letter.  Will ask staff to fax back to requesting agency- [employer].  Ellin Mayhew MD  November 29, 2010 1:57 PM   Additional Follow-up for Phone Call Additional follow up Details #1::        faxed Additional Follow-up by: De Nurse,  November 29, 2010 2:32 PM

## 2011-01-22 NOTE — Progress Notes (Signed)
Summary: [facility]  ---- Converted from flag ---- ---- 03/16/2010 2:03 PM, Pearlean [name] MD wrote: Pls check with patient to see if went to his eye appointment and record in Referral  thanks [facility] ------------------------------  called and spoke with Jasmine December at medical records. she will fax the info, if the pt kept the appt...waiting on fax.

## 2011-01-22 NOTE — Progress Notes (Signed)
Summary: LOOK IN YOUR BOX/TS  Phone Note Call from Patient Call back at [phone]   Caller: Patient Summary of Call: pt is requesting temporary handicap sticker - until dizzyness is resolved. Initial call taken by: De Nurse,  November 13, 2010 11:07 AM  Follow-up for Phone Call        There is a DMV form that needs to be filled out for this temporary handicap sticker to be given to the pt.  She needs to contact the [facility] and bring in the paperwork that they require to be completed by the physician.   Ellin Mayhew MD  November 13, 2010 5:58 PM   Additional Follow-up for Phone Call Additional follow up Details #1::        called pt and informed, that we would call her as soon as Dr.Caviness did sign her temp.handicap placard. Dr.Caviness: form is in your box. Please sign it and I will call pt to pick it up. thanks Additional Follow-up by: Arlyss Repress CMA,,  November 14, 2010 2:51 PM    Additional Follow-up for Phone Call Additional follow up Details #2::    I checked my box I do not see the form.    Ellin Mayhew MD  November 21, 2010 5:58 PM

## 2011-01-22 NOTE — Letter (Signed)
Summary: Generic Letter  [facility]  [address]   [place], [place] [address]   Phone: [phone]  Fax: [phone]    11/29/2010  To whom it may concern: Megan Kemp (DOB 05/16/1961) is unable to drive at the current time secondary to her current medical condition.  Pt has been evaluated by myself and has been sent to neurology for their consult. The prognosis and duration of her illness has not yet been determined.   Sincerely,   Ellin Mayhew MD
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
## 2011-01-22 NOTE — Progress Notes (Signed)
Summary: Rx Req  Phone Note Refill Request Call back at [phone] Message from:  Patient  Refills Requested: Medication #1:  NAPROSYN 500 MG TABS 1 by mouth two times a day as needed pain  Medication #2:  PROMETHAZINE HCL 25 MG TABS 1 tab by mouth q 6hrs as needed dizziness. Out Pt pharm  Initial call taken by: De Nurse,  September 17, 2010 10:11 AM Caller: Patient Summary of Call: Needs new rx for walker faxed to [phone].  Att: Dana  Pt lost the first script.  Also said she was to have a pain medication faxed to [facility]. Initial call taken by: Clydell Hakim,  September 13, 2010 2:28 PM  Follow-up for Phone Call        already has walker  needs pain meds today  In the record I refilled this on 9/22 when this was first requested but I will resend the refills today to ensure that the outpatient pharmacy recieved it.   Will ask front office staff to notify patient that the refill has been sent. Ellin Mayhew MD  September 20, 2010 11:28 AM  Follow-up by: De Nurse,  September 17, 2010 10:11 AM    Prescriptions: PROMETHAZINE HCL 25 MG TABS (PROMETHAZINE HCL) 1 tab by mouth q 6hrs as needed dizziness  #60 x 0   Entered and Authorized by:   Ellin Mayhew MD   Signed by:   Ellin Mayhew MD on 09/20/2010   Method used:   Electronically to        [facility]* (retail)       [address]       [address] Shipping/mailing       [place], [place]  [address]       Ph: [phone]       Fax: [phone]   RxID:   [id number] NAPROSYN 500 MG TABS (NAPROXEN) 1 by mouth two times a day as needed pain  #40 x 0   Entered and Authorized by:   Ellin Mayhew MD   Signed by:   Ellin Mayhew MD on 09/20/2010   Method used:   Electronically to        [facility]* (retail)       [address]       [address] Shipping/mailing       [place], [place]  [address]       Ph: [phone]       Fax: [phone]   RxID:    [id number]

## 2011-01-22 NOTE — Letter (Signed)
Summary: Disability Insurance Benefits  Disability Insurance Benefits   Imported By: Clydell Hakim 10/03/2010 16:05:05  _____________________________________________________________________  External Attachment:    Type:   Image     Comment:   External Document

## 2011-01-22 NOTE — Assessment & Plan Note (Signed)
Summary: migraine headache/neuro consult f/up   Vital Signs:  Patient profile:   50 year old female Weight:      176.6 pounds Pulse rate:   89 / minute BP sitting:   132 / 78  (right arm) Cuff size:   regular  Vitals Entered By: [name] slade,cma CC: f/up migraine head ache and refill pain meds. Is Patient Diabetic? No Pain Assessment Patient in pain? yes     Location: head Intensity: 8 Onset of pain  x 1 week   Primary Care Provider:  Ellin Mayhew, MD  CC:  f/up migraine head ache and refill pain meds..  History of Present Illness: multiple chronic symptoms: still having black spots, headaches everyday/constant never goes away, hands tingling/numbness bilateral, dizziness constant "I feel like I am on a merry go round", never improves- not helped by antivert or phenergan.  left eye won't open easily.  + nausea.  decreased appetite.    neurology visit on 10/19/10: to eval above now chronic symptoms.  Decided to do MRA of the head and brainstem auditory evoked response test.  These results are pending.  Per neuro note these symptoms seem to be most consistent with migraine h/a associated with vertigo.  Pt's next appt with neuro is 01/17/10.   depression: has only been taking zoloft 12.5mg .  She started taking topamax and zoloft at the same time and claims that she had hallucinations.  Therefore she cut both tablets back to 1/2 tablet.  Pt states that her depression symptoms are not getting any better.  pt has gone to 2 therapy appts but does not  know if this is helping her.    medication: started taking topamax and zoloft at same time- had hallucination of small bears in house on a merry go round.   So has been taking 1/2 dose of topamax and zoloft.      Habits & Providers  Alcohol-Tobacco-Diet     Tobacco Status: never  Current Medications (verified): 1)  Naprosyn 500 Mg Tabs (Naproxen) .Marland Kitchen.. 1 By Mouth Two Times A Day As Needed Pain 2)  Promethazine Hcl 25 Mg Tabs  (Promethazine Hcl) .Marland Kitchen.. 1 Tab By Mouth Q 6hrs As Needed Dizziness 3)  Zoloft 25 Mg Tabs (Sertraline Hcl) .... Take 1 Tablet Daily 4)  Topamax 25 Mg Tabs (Topiramate) .... Take 1 Per Day- Pt States She Is Only Taking 1/2 Tab Per Day 5)  Tramadol Hcl 50 Mg Tabs (Tramadol Hcl) .... Take 1 Tablet Three Times A Day  Allergies: 1)  ! Codeine  Review of Systems       as per hpi  Physical Exam  General:  Well-developed,well-nourished,in no acute distress; alert,appropriate and cooperative throughout examination Eyes:  EOMI, pt reports increased dizziness while assessing Extraocular movements. Lungs:  Normal respiratory effort, chest expands symmetrically. Lungs are clear to auscultation, no crackles or wheezes. Heart:  Normal rate and regular rhythm. S1 and S2 normal without gallop, murmur, click, rub or other extra sounds. Extremities:  no edema Neurologic:  alert & oriented X3, cranial nerves II-XII intact, and strength normal in all extremities.  sensation grossly equal bilat in upper and lower ext.  slowed gait using walker to ambulate.  endorses decreased sensation in hands bilateral.    Impression & Recommendations:  Problem # 1:  DEPRESSIVE DISORDER NOT ELSEWHERE CLASSIFIED (ICD-311)  pt encouraged to go to therapy with a plan of something to work on.  Discussed that she is a private person at baseline but that  this has been self defeating and has kept her from seeking out help for her depression.  Encouraged her to view Dr. Pascal Lux as a safe person that she can work on coping mechanisms to help deal with her depression and increase her level of function.  Pt encouraged to take zoloft 25 mg.  Pt states she will take full dose and let me know if any side effects.  Her updated medication list for this problem includes:    Zoloft 25 Mg Tabs (Sertraline hcl) .Marland Kitchen... Take 1 tablet daily  Orders: [facility]- Est  Level 4 ([address])  Problem # 2:  HEADACHE (ICD-784.0)  neuro seems to think that her  symptoms are most likely 2/2 to migraines but are ruling out anuerysm with MRA.  Pt started on topamax.  Pt also on naprosyn and tramadol for pain.  will follow up on results.  next neuro exam in 01/17/11.    Her updated medication list for this problem includes:    Naprosyn 500 Mg Tabs (Naproxen) .Marland Kitchen... 1 by mouth two times a day as needed pain    Tramadol Hcl 50 Mg Tabs (Tramadol hcl) .Marland Kitchen... Take 1 tablet three times a day prn  Orders: [facility]- Est  Level 4 ([address])  Problem # 3:  DIZZINESS (ICD-780.4)  not improved.  neuro seems to think that this is related to migraine h/a.  will continue to follow.  nuero exam wnl objectively except for slowed gait.  otherwise symptoms are all subjective per pt report.   The following medications were removed from the medication list:    Antivert 25 Mg Tabs (Meclizine hcl) .Marland Kitchen... 1 tab by mouth q 6 hrs as needed dizziness Her updated medication list for this problem includes:    Promethazine Hcl 25 Mg Tabs (Promethazine hcl) .Marland Kitchen... 1 tab by mouth q 6hrs as needed dizziness  Orders: [facility]- Est  Level 4 ([address])  Problem # 4:  PREVENTIVE HEALTH CARE (ICD-V70.0) when acute issues resolved will review pcmh form with pt.   Complete Medication List: 1)  Naprosyn 500 Mg Tabs (Naproxen) .Marland Kitchen.. 1 by mouth two times a day as needed pain 2)  Promethazine Hcl 25 Mg Tabs (Promethazine hcl) .Marland Kitchen.. 1 tab by mouth q 6hrs as needed dizziness 3)  Zoloft 25 Mg Tabs (Sertraline hcl) .... Take 1 tablet daily 4)  Topamax 25 Mg Tabs (Topiramate) .... Take 1 per day- pt states she is only taking 1/2 tab per day 5)  Tramadol Hcl 50 Mg Tabs (Tramadol hcl) .... Take 1 tablet three times a day prn  Patient Instructions: 1)  return in 1 month for follow up appt. 2)  follow up with nuerology as planned Prescriptions: NAPROSYN 500 MG TABS (NAPROXEN) 1 by mouth two times a day as needed pain  #40 x 0   Entered and Authorized by:   Ellin Mayhew MD   Signed by:   Ellin Mayhew MD on  11/12/2010   Method used:   Electronically to        [facility]* (retail)       [address]       [address] Shipping/mailing       [place], [place]  [address]       Ph: [phone]       Fax: [phone]   RxID:   [id number] TRAMADOL HCL 50 MG TABS (TRAMADOL HCL) take 1 tablet three times a day  #30 x 1   Entered and Authorized by:  Ellin Mayhew MD   Signed by:   Ellin Mayhew MD on 11/12/2010   Method used:   Electronically to        [facility]* (retail)       [address]       [address] [place] Shipping/mailing       [place], [place]  [address]       Ph: [phone]       Fax: [phone]   RxID:   [id number]    Orders Added: 1)  [facility]- Est  Level 4 [[address]]

## 2011-01-22 NOTE — Progress Notes (Signed)
Summary: RE: FORMS/TS  Phone Note Call from Patient Call back at [phone]   Caller: Patient Summary of Call: Pt said she saw Dr. Jeanice Lim while Dr. Edmonia James was out and her insurance company faxed paperwork to Dr. Jeanice Lim to be filled out.  Checking on the status of paperwork. Initial call taken by: Clydell Hakim,  September 17, 2010 10:31 AM  Follow-up for Phone Call        fwd. to Dr.[place] to check. Follow-up by: Arlyss Repress CMA,,  September 17, 2010 11:15 AM  Additional Follow-up for Phone Call Additional follow up Details #1::        The [employer] paper was faxed 8/29, someone can pull it and fax it again if needed Additional Follow-up by: Milinda Antis MD,  September 17, 2010 12:18 PM    Additional Follow-up for Phone Call Additional follow up Details #2::    called pt and informed, that paper work was faxed Follow-up by: Arlyss Repress CMA,,  September 18, 2010 8:58 AM

## 2011-01-22 NOTE — Miscellaneous (Signed)
Summary: medical record request  Clinical Lists Changes  Rec'd medical record request to go to: [facility] date sent: 09/14/10 Marily Memos  November 20, 2010 3:07 PM

## 2011-01-22 NOTE — Consult Note (Signed)
Summary: [facility]  [facility]   Imported By: Clydell Hakim 10/03/2010 09:24:32  _____________________________________________________________________  External Attachment:    Type:   Image     Comment:   External Document

## 2011-01-22 NOTE — Assessment & Plan Note (Signed)
Summary: form completion- dizziness   Vital Signs:  Patient profile:   50 year old female Weight:      172 pounds Pulse rate:   78 / minute BP sitting:   142 / 95  (right arm)  Vitals Entered By: Arlyss Repress CMA, (September 06, 2010 2:06 PM) CC: migraine headache x 2 days. dizziness started 08-08-10. needs form filled out. Is Patient Diabetic? No Pain Assessment Patient in pain? no        Primary Care Provider:  Ellin Mayhew, MD  CC:  migraine headache x 2 days. dizziness started 08-08-10. needs form filled out.Marland Kitchen  History of Present Illness: Pt states that she is here to get form completed: would like paperwork filled out so that the bank will give "pay her car loan" while she is out of work.    Pt requesting Rx for walker: States that Pt suggested this. to help prevent falls while she is in therapy.  Also requests for refills to be sent in: Rx refil sent to outpt pharmacy for naprosyn and phenergan  Habits & Providers  Alcohol-Tobacco-Diet     Tobacco Status: never  Current Medications (verified): 1)  Antivert 25 Mg Tabs (Meclizine Hcl) .Marland Kitchen.. 1 Tab By Mouth Q 6 Hrs As Needed Dizziness 2)  Naprosyn 500 Mg Tabs (Naproxen) .Marland Kitchen.. 1 By Mouth Two Times A Day As Needed Pain 3)  Promethazine Hcl 25 Mg Tabs (Promethazine Hcl) .Marland Kitchen.. 1 Tab By Mouth Q 6hrs As Needed Dizziness  Allergies (verified): 1)  ! Codeine  Review of Systems       No fever.  No weight loss.  + dizziness when laying back flat.  + migraine h/a off and on, No cought.  No CP.  No SoB.   Physical Exam  General:  VSS Well-developed,well-nourished,in no acute distress; alert,appropriate and cooperative throughout examination Lungs:  Normal respiratory effort, chest expands symmetrically. Extremities:  no edema Neurologic:  Pt deferred neuro exam--she states that it always makes her feel worse.    Impression & Recommendations:  Problem # 1:  BENIGN POSITIONAL VERTIGO (ICD-386.11) Completed paperwork for  pt to take to bank (took approx 5-61min to complete).  Also wrote for walker to help pt with PT as requested.  Also refilled medications as requested, pt states that they do help her symptoms to some degree.  Encouraged pt to make an appt so that we can focus just on the current issue of the dizziness that appears from last clinic not to be BPPV.  Pt started therapy this week.  She states that she will return to see me after a couple of weeks with working with PT.  Her updated medication list for this problem includes:    Antivert 25 Mg Tabs (Meclizine hcl) .Marland Kitchen... 1 tab by mouth q 6 hrs as needed dizziness    Promethazine Hcl 25 Mg Tabs (Promethazine hcl) .Marland Kitchen... 1 tab by mouth q 6hrs as needed dizziness  Orders: [facility]- Est Level  3 ([address])  Complete Medication List: 1)  Antivert 25 Mg Tabs (Meclizine hcl) .Marland Kitchen.. 1 tab by mouth q 6 hrs as needed dizziness 2)  Naprosyn 500 Mg Tabs (Naproxen) .Marland Kitchen.. 1 by mouth two times a day as needed pain 3)  Promethazine Hcl 25 Mg Tabs (Promethazine hcl) .Marland Kitchen.. 1 tab by mouth q 6hrs as needed dizziness Prescriptions: PROMETHAZINE HCL 25 MG TABS (PROMETHAZINE HCL) 1 tab by mouth q 6hrs as needed dizziness  #60 x 1   Entered and Authorized by:  Ellin Mayhew MD   Signed by:   Ellin Mayhew MD on 09/13/2010   Method used:   Electronically to        [facility]* (retail)       [address]       [address] [place] Shipping/mailing       [place], [place]  [address]       Ph: [phone]       Fax: [phone]   RxID:   [id number] NAPROSYN 500 MG TABS (NAPROXEN) 1 by mouth two times a day as needed pain  #40 x 0   Entered and Authorized by:   Ellin Mayhew MD   Signed by:   Ellin Mayhew MD on 09/13/2010   Method used:   Electronically to        [facility]* (retail)       [address]       [address] Shipping/mailing       [place], [place]  [address]       Ph: [phone]       Fax: [phone]   RxID:    [id number]

## 2011-01-24 NOTE — Letter (Signed)
Summary:  Referral letter  [facility]  [address]   [place], [place] [address]   Phone: [phone]  Fax: [phone]    [facility]uer[facility] Care Team:  I have referred Megan Kemp, date of birth Apr 17, 1961, for a consult.   Thank you so much for seeing her.  She has told me that you have a follow up appt schedule with her in January.  I am writing to ask that you please help me with her prognosis, diagnosis, work limitiations, etc.  I have already completed multiple forms for this patient pertaining to her missing work and her current pursuit of disability.  I have hesitated to state anything definative on these forms until I have received back your input on diagnosis and prognosis.  I appreciate your help with this patient.  Please do not hesitate to contact my office as needed. Thank you in advance for your cooperation.   Sincerely,  Ellin Mayhew, MD [facility] telephone--[phone]

## 2011-01-24 NOTE — Assessment & Plan Note (Addendum)
Summary: dizziness/headache/cramps   Vital Signs:  Patient profile:   50 year old female Height:      67.5 inches Weight:      182 pounds BMI:     28.19 Temp:     98.5 degrees F oral Pulse rate:   75 / minute BP sitting:   114 / 75  (left arm) Cuff size:   regular  Vitals Entered By: Tessie Fass CMA (January 11, 2011 1:54 PM) CC: headache and dizziness Is Patient Diabetic? No Pain Assessment Patient in pain? yes     Location: head Intensity: 9   Primary Care Provider:  Ellin Mayhew, MD  CC:  headache and dizziness.  History of Present Illness: H/A: h/a  has been constant for 3 months. pulsating in temple area bilateral.  some blurry vision occasional.  Pt states that the headache goes away except when she falls asleep.  + nausea.  able to eat.  normal appetite.   no vomiting.    dizziness: 'today my head feels like it is spinning like a top"  riding in a car makes it worse, sitting still and closing her eyes makes it better.  occasional nausea. no vomiting.  depression: since taking the depression medcine has noticed that the h/a and dizziness has 'slacked off" a little bit but still present.  pt states that the voices saying "suicide" are now gone.  has mild improvement in energy.  good appetite.  less tearful.  but still feels down at times.    cramping in hands and legs: continues to have cramping in hands bilateral, has to pat hands to get relief,  right hand ring finger seems to have more pain than the other.  massaging makes the cramping better, nothing makes it worse.  cramping in calves has improved recently.  Has happened 3 x in past week.  calf cramp occurs at night.  Has been eating bananas, and it seems to have helped some.    Current Medications (verified): 1)  Naprosyn 500 Mg Tabs (Naproxen) .Marland Kitchen.. 1 By Mouth Two Times A Day As Needed Pain 2)  Promethazine Hcl 25 Mg Tabs (Promethazine Hcl) .Marland Kitchen.. 1 Tab By Mouth Q 6hrs As Needed Dizziness 3)  Zoloft 50 Mg  Tabs (Sertraline Hcl) .... Take 1 Tablet Daily 4)  Topamax 25 Mg Tabs (Topiramate) .... Take 1 Tablet Daily 5)  Tramadol Hcl 50 Mg Tabs (Tramadol Hcl) .... Take 1 Tablet Three Times A Day Prn  Allergies (verified): 1)  ! Codeine  Review of Systems       as per hpi  Physical Exam  General:  Well-developed,well-nourished,in no acute distress; alert,appropriate and cooperative throughout examination Lungs:  Normal respiratory effort, chest expands symmetrically. Lungs are clear to auscultation, no crackles or wheezes. Heart:  Normal rate and regular rhythm. S1 and S2 normal without gallop, murmur, click, rub or other extra sounds. Abdomen:  soft and non-tender.   Extremities:  no edema Neurologic:  alert & oriented X3, strength normal in all extremities, and gait normal, sits on edge of chair for approx 15 sec before standing.  No changes in voice.  Affect brighter today.  Skin:  Intact without suspicious lesions or rashes Psych:  Oriented X3, memory intact for recent and remote, normally interactive, good eye contact, not anxious appearing, not depressed appearing, not agitated, not suicidal, and not homicidal.     Impression & Recommendations:  Problem # 1:  DIZZINESS (ICD-780.4) Still unsure of cause.  appt with neuro on  1/30.  May be related to migraine h/a?  Possibly treatment of migraines will help dizziness? Pt encouraged to take topamax at recommended dosage.   Her updated medication list for this problem includes:    Promethazine Hcl 25 Mg Tabs (Promethazine hcl) .Marland Kitchen... 1 tab by mouth q 6hrs as needed dizziness  Problem # 2:  HEADACHE (ICD-784.0) neuro seems to think pt may have migraines.  started on topomax but pt only taking 1/2 tab up until now b/c she felt it was causing hallucinations.  No further hallucinations.  encouraged pt to take full dose of topamax daily since she continues to have headache.  Pt to call if any further hallucinations.  Her updated medication list  for this problem includes:    Naprosyn 500 Mg Tabs (Naproxen) .Marland Kitchen... 1 by mouth two times a day as needed pain    Tramadol Hcl 50 Mg Tabs (Tramadol hcl) .Marland Kitchen... Take 1 tablet three times a day prn  Problem # 3:  DEPRESSIVE DISORDER NOT ELSEWHERE CLASSIFIED (ICD-311) Assessment: Improved improved per pt, but continues to have some symptoms as outlined in HPI.  I would like to titrate up but hesitate to do this since pt not comfortable usually with changing more than one dosage at a time.  Will focus on getting pt to take topamax as directed.  If she tolerates well will titrated up zoloft.  Pt states she couldn't get through to [facility] to get an appt with a psychiatrist.  Gave her an alternative phone number--encouraged her to try again.  Her updated medication list for this problem includes:    Zoloft 50 Mg Tabs (Sertraline hcl) .Marland Kitchen... Take 1 tablet daily  Problem # 4:  CRAMP OF LIMB (ICD-729.82)  requested that pt have B12 and BMET rechecked.  pt states she doesn't want blood drawn today but will come back.   Future Orders: Basic Met-[facility] [id number]) ... 12/26/2011 B12-[facility] ([id number]) ... 01/30/2011  Complete Medication List: 1)  Naprosyn 500 Mg Tabs (Naproxen) .Marland Kitchen.. 1 by mouth two times a day as needed pain 2)  Promethazine Hcl 25 Mg Tabs (Promethazine hcl) .Marland Kitchen.. 1 tab by mouth q 6hrs as needed dizziness 3)  Zoloft 50 Mg Tabs (Sertraline hcl) .... Take 1 tablet daily 4)  Topamax 25 Mg Tabs (Topiramate) .... Take 1 tablet daily 5)  Tramadol Hcl 50 Mg Tabs (Tramadol hcl) .... Take 1 tablet three times a day prn  Patient Instructions: 1)  Call the [facility] for an appt- [phone]  2)  go to scheduled neuro appt on 1/30.   3)  make a follow up appt to see me after your visit with neuro. 4)  take regular dose as prescribed by neuro of topamax, monitor for side effects. Prescriptions: TOPAMAX 25 MG TABS (TOPIRAMATE) take 1 tablet daily  #30 x 1   Entered and Authorized by:    Ellin Mayhew MD   Signed by:   Ellin Mayhew MD on 01/11/2011   Method used:   Electronically to        [facility]* (retail)       [address]       [address] Shipping/mailing       [place], [place]  [address]       Ph: [phone]       Fax: [phone]   RxID:   [id number]    Orders Added: 1)  Basic Met-[facility] [[id number]] 2)  B12-[facility] [[id number]]  Appended Document: dizziness/headache/cramps  Correction to PE during 1/20 visit: Heart- RRR, + systolic murmur.  A and P - Systolic heart murmur Pt denies hx of murmur.  Pt endorses orthopnea and PND.  no lower ext edema.  In setting of doing work up for persistent dizziness I feel that it is important to eval heart with echo.  Order for echo placed.

## 2011-01-24 NOTE — Progress Notes (Signed)
Summary: waiting for pt to call back/TS  Phone Note Call from Patient Call back at [facility] Phone [phone]   Summary of Call: pt suppose to be referred to mental health but pt does not have mental health coverage on her insurance, needs alternate place to go to.  Initial call taken by: Knox Royalty,  January 15, 2011 11:56 AM  Follow-up for Phone Call        called pt '[employer] ON ANSWERING MACHINE'..unable to leave message. waiting for pt to call back. PLEASE ask pt, where she was sent to. Pt has [employer], she can call and find out, which services are available for her through her insurance. thanks. Follow-up by: Arlyss Repress CMA,,  January 15, 2011 2:24 PM  Additional Follow-up for Phone Call Additional follow up Details #1::        called pt @ [phone]. says she has appt 01/28/11 @ 9am with Dr Len Blalock. Additional Follow-up by: Tessie Fass CMA,  January 16, 2011 11:30 AM

## 2011-01-24 NOTE — Letter (Signed)
Summary: Out of Work  [facility]  [address]   [place], [place] [address]   Phone: [phone]  Fax: [phone]    December 18, 2010   Employee:  Megan Kemp    To Whom It May Concern:   For Medical reasons, please excuse the above named employee from work.  We are currently seeing pt of dizziness of unknown etiology.  I have not been able to find a definative diagnosis therefore I do not know her prognosis.  She is being evaluated by neurology for her symptoms.  I have asked for there help in finding a dignosis and prognosis for Megan Kemp.  Pt states that she is unable to walk without the use of her walker secondary to the dizziness.   She also reports that she is unable to read or do paperwork secondary to this making her dizziness worse.    If you need additional information, please feel free to contact our office.         Sincerely,    Ellin Mayhew MD

## 2011-01-24 NOTE — Assessment & Plan Note (Signed)
Summary: dizzines/hand pain/depression   Vital Signs:  Patient profile:   50 year old female Height:      67.5 inches Weight:      184.1 pounds BMI:     28.51 Temp:     98.3 degrees F oral Pulse rate:   84 / minute BP sitting:   130 / 79  (left arm) Cuff size:   regular  Vitals Entered By: Jimmy Footman, CMA (December 19, 2010 1:45 PM) CC: hand & finger pain, vertigo, depression, thoughts of suicide Is Patient Diabetic? No Pain Assessment Patient in pain? yes        Primary Care [facility] Gates:  Ellin Mayhew, MD  CC:  hand & finger pain, vertigo, depression, and thoughts of suicide.  History of Present Illness: Pt continues to complain of same symptoms: -blurry vision, hand cramps (has to rub them to relieve pain- pain worse at night), leg cramps, dizziness, feels off balance, H/A everyday all day, bright flashing lights.- states that the biggest changes since her last visit with me is the cramping in her hands.  She states that it hurts her hands to hold onto the walker or the cane in order to ambulate.  She states that there is no way that she can return to work at this point b/c her hands cramp and hurt when she grabs onto things like a wheel chair or any object.  Therefore she feels she would not be able to do her work.  Also dizziness worse with ambulation and movement.  pt placed on Topamax by neurology but pt only taking 1/2 of prescribed dose b/c she states that she hallucinated and saw "bears running around" when she took the full dose of topamax. - all studies done by neuro have returned wnl (MRI/MRA, brain stem evoked response)  Called neuro office to discuss this pt. spoke with PA that works with Dr. Anne Hahn.  She is scheduled to see Mrs. Grabinski on 1/30.  Reviewed pt's nuerologic symptoms as well as psychiatric symptoms with PA.  I asked her specifically to help me at her next appt with labeling this, giving a diagnosis as well as prognosis since I recieve papers multiple x per week  to complete for pt's work and they are requesting diagnosis and prognosis- both of which i do not have the answers for.  She states that she will review test results with patient and recheck exam and send me her assessment and recommendations after her next appt with Ms. Nickson  depression/saddness: "tired", bills piling up.  has thoughts of suicide.  no plan, but does have passive thoughts.  states that she is safe and would ,"never do anything like that."  occasionally hears voices that say "suicide." Pt states that she is taking antidepressant as directed.  She reports hx of passive thoughts of suicide event at her first visit. thoughts not worse with antidepressant.      Current Medications (verified): 1)  Naprosyn 500 Mg Tabs (Naproxen) .Marland Kitchen.. 1 By Mouth Two Times A Day As Needed Pain 2)  Promethazine Hcl 25 Mg Tabs (Promethazine Hcl) .Marland Kitchen.. 1 Tab By Mouth Q 6hrs As Needed Dizziness 3)  Zoloft 50 Mg Tabs (Sertraline Hcl) .... Take 1 Tablet Daily 4)  Topamax 25 Mg Tabs (Topiramate) .... Take 1 Per Day- Pt States She Is Only Taking 1/2 Tab Per Day 5)  Tramadol Hcl 50 Mg Tabs (Tramadol Hcl) .... Take 1 Tablet Three Times A Day Prn  Allergies (verified): 1)  ! Codeine  Review of Systems       as per hpi.  Physical Exam  General:  VSS Well-developed,well-nourished,in no acute distress; alert,appropriate and cooperative throughout examination Head:  Normocephalic and atraumatic without obvious abnormalities.  Eyes:  no nystagmus.  Lungs:  Normal respiratory effort, chest expands symmetrically. Lungs are clear to auscultation, no crackles or wheezes. Heart:  Normal rate and regular rhythm. S1 and S2 normal without gallop, murmur, click, rub or other extra sounds. Extremities:  no edema Neurologic:  slowed gait, uses walker for ambulation.  Improved gait speed compared to previous visits, yet still slowed.  alert & oriented X3 and strength normal in all extremities.  sensation intact bilateral  to touch in hands.  strength equal in hands bilateral.     Skin:  Intact without suspicious lesions or rashes Psych:  see hpi. tearful during exam.Oriented X3, memory intact for recent and remote, normally interactive, and not anxious appearing.     Impression & Recommendations:  Problem # 1:  DIZZINESS (ICD-780.4) Assessment Unchanged dizziness and other symptoms persistent/unchanged.  see conversation in hpi with neuro.  When asked if there are other things/work up that they would suggest I do prior to her 1/30 appt they suggested a possible neuropsych consult to help tease out the etiology for her multiple symptoms.   f/up appt with neurologist on 1/30.  referral for neuropsych consult placed to discuss both neuro and psych issues.   Her updated medication list for this problem includes:    Promethazine Hcl 25 Mg Tabs (Promethazine hcl) .Marland Kitchen... 1 tab by mouth q 6hrs as needed dizziness  Orders: [facility]- Est  Level 4 ([address]214) Misc. Referral (Misc. Ref)  Problem # 2:  DEPRESSIVE DISORDER NOT ELSEWHERE CLASSIFIED (ICD-311) has had long hx of suicidal thoughts, pt states that they did not get worse with zoloft.  contracted with pt for safety.  pt to go to ER if she feels unsafe or has thoughts of suicide. will increase doseage of zoloft.  pt states understanding.    Her updated medication list for this problem includes:    Zoloft 50 Mg Tabs (Sertraline hcl) .Marland Kitchen... Take 1 tablet daily  Orders: [facility]- Est  Level 4 ([address]214) Misc. Referral (Misc. Ref)  Problem # 3:  CRAMP OF LIMB (ICD-729.82) Hand cramping per pt is now much worse and hurts to grab things such as walker or objects.  BMET and TSH checked in October for this complaint- both were within normal limits.  Will follow this complaint.  If it persists or worsens will consider recheck of BMET and possibly check of B12 at next appt.   Orders: [facility]- Est  Level 4 ([address]214) Misc. Referral (Misc. Ref)  Complete Medication List: 1)  Naprosyn 500 Mg  Tabs (Naproxen) .Marland Kitchen.. 1 by mouth two times a day as needed pain 2)  Promethazine Hcl 25 Mg Tabs (Promethazine hcl) .Marland Kitchen.. 1 tab by mouth q 6hrs as needed dizziness 3)  Zoloft 50 Mg Tabs (Sertraline hcl) .... Take 1 tablet daily 4)  Topamax 25 Mg Tabs (Topiramate) .... Take 1 per day- pt states she is only taking 1/2 tab per day 5)  Tramadol Hcl 50 Mg Tabs (Tramadol hcl) .... Take 1 tablet three times a day prn  Patient Instructions: 1)  I am going to increase your zoloft to 50mg  daily to help with your depression. 2)  I am going to refer you to the nueropsychologist. 3)  Speak with front office staff and let them know what records  you would like sent to which office. 4)  If any thoughts of suicide go directly to emergency department.  Prescriptions: ZOLOFT 50 MG TABS (SERTRALINE HCL) take 1 tablet daily  #30 x 3   Entered and Authorized by:   Ellin Mayhew MD   Signed by:   Ellin Mayhew MD on 12/19/2010   Method used:   Electronically to        [facility]* (retail)       [address]       [address] Shipping/mailing       [place], [place]  [address]       Ph: [phone]       Fax: [phone]   RxID:   [id number]    Orders Added: 1)  [facility]- Est  Level 4 [10626] 2)  Misc. Referral [Misc. Ref]

## 2011-01-24 NOTE — Miscellaneous (Signed)
Summary: [facility]  ROI   Imported By: Bradly Bienenstock 12/20/2010 16:07:57  _____________________________________________________________________  External Attachment:    Type:   Image     Comment:   External Document

## 2011-01-24 NOTE — Letter (Signed)
Summary: Disability papers  Disability papers   Imported By: De Nurse 12/12/2010 10:46:32  _____________________________________________________________________  External Attachment:    Type:   Image     Comment:   External Document

## 2011-01-24 NOTE — Progress Notes (Signed)
Summary: phn msg  Phone Note Call from Patient Call back at [phone]   Caller: Patient Summary of Call: is checking on disability paper - sister brought it in last week. Initial call taken by: De Nurse,  January 08, 2011 9:03 AM  Follow-up for Phone Call        This has now been completed...see the other phone note adressing this question. Ellin Mayhew MD  January 09, 2011 1:55 PM

## 2011-01-24 NOTE — Progress Notes (Signed)
  Phone Note Other Incoming   Caller: [place] [employer]- Disability Dept. Summary of Call: Calling back to say I need from you or neurologistf what restrictions or limiitations that she cannot do for her occupation.  Her occupation is patient transporter at hospital.  This does not include driving to work and or rationalize how meds would effect her occupation.  Please send written explanation to fax#  [phone].  A vebal reponse is not adequate, need written for records.  I appreciate all you have done so far. Initial call taken by: Abundio Miu,  December 11, 2010 12:01 PM  Follow-up for Phone Call        letter complete will ask office staff to fax to requesting party.  Ellin Mayhew MD  December 18, 2010 3:08 PM   Additional Follow-up for Phone Call Additional follow up Details #1::        faxed to number above, also forwarded to Molly Maduro to complete referral. Additional Follow-up by: Knox Royalty,  December 19, 2010 4:58 PM    Additional Follow-up for Phone Call Additional follow up Details #2::    referral sent to fax [phone]. they will contact pt with appt Follow-up by: Tessie Fass CMA,  December 20, 2010 9:59 AM

## 2011-01-24 NOTE — Letter (Signed)
Summary: Disability  Disability   Imported By: Knox Royalty 01/17/2011 09:07:28  _____________________________________________________________________  External Attachment:    Type:   Image     Comment:   External Document

## 2011-01-24 NOTE — Miscellaneous (Signed)
Summary: Orders Update  Clinical Lists Changes  Problems: Added new problem of AUDITORY HALLUCINATION (ICD-780.1) Orders: Added new Referral order of Psychiatric Referral (Psych) - Signed

## 2011-01-24 NOTE — Progress Notes (Signed)
  Phone Note Outgoing Call   Call placed by: dawn caviness, Md Call placed to: Patient Summary of Call: Pt states that [facility] said that "I didn't do the paperwork right."  Pt gave me the telephone number for Rejeana Brock [phone] ext [phone] so I can contact her by phone.   Pt requesting her last visit paperwork.  Told her to call the front desk in the morning and that they will tell her what she needs to do to get the last office note.  Pt states understanding.  Ellin Mayhew MD  December 10, 2010 6:11 PM      Appended Document:  called and left VM on phone number above for Ms. Weizel.

## 2011-01-24 NOTE — Miscellaneous (Signed)
Summary: sent medical record   Clinical Lists Changes Sent medi records to [facility] from 08/23/10 to present. [employer] [place]

## 2011-01-24 NOTE — Progress Notes (Signed)
Summary: Disability  Phone Note Call from Patient Call back at [phone]   Caller: Patient Summary of Call: Ms. Geoffroy needed the forms completed and ready for pickup on Friday.  Have to turn them to get payment  for disability. Initial call taken by: Abundio Miu,  December 05, 2010 3:49 PM  Follow-up for Phone Call        pt is calling again.  needs to get today Follow-up by: De Nurse,  December 07, 2010 11:11 AM  Additional Follow-up for Phone Call Additional follow up Details #1::        pt asking to speak with RN about a letter in addition to her papers, wants RN to call so she can explain.  Additional Follow-up by: Knox Royalty,  December 07, 2010 11:37 AM    Additional Follow-up for Phone Call Additional follow up Details #2::    forms are complete.  Pt. will need to make an appt in the future for form completion since many of these forms are directly asking about how she is able to function at the current time and I need her input to complete these forms.  Will ask staff to please let her know this when they call to let her know that forms are ready for pickup.  Ellin Mayhew MD  December 07, 2010 2:06 PM    Appended Document: Disability called pt and left message with female to call us back

## 2011-01-24 NOTE — Progress Notes (Signed)
Summary: Disability Paperwork  Phone Note Outgoing Call   Call placed by: Milinda Antis MD,  December 26, 2010 4:43 PM Details for Reason: [employer] Summary of Call: I recieved paper work from the group above named, I am not the primary physican who has been seeing this patient for her disability work-up. I spoke with the above name group, the form was faxed back to them with Dr. Edmonia James information. They will adjust this and send the paperwork back

## 2011-01-24 NOTE — Miscellaneous (Signed)
Summary: ROI  ROI   Imported By: Knox Royalty 01/17/2011 09:06:53  _____________________________________________________________________  External Attachment:    Type:   Image     Comment:   External Document

## 2011-01-24 NOTE — Miscellaneous (Signed)
Summary: Orders Update  Clinical Lists Changes  Problems: Added new problem of SYSTOLIC MURMUR (ZOX-096.0) Orders: Added new Test order of [facility]- Est  Level 4 ([address]409) - Signed Added new Referral order of Echo Referral (Echo) - Signed

## 2011-01-24 NOTE — Progress Notes (Signed)
  Prescriptions: NAPROSYN 500 MG TABS (NAPROXEN) 1 by mouth two times a day as needed pain  #40 x 0   Entered and Authorized by:   Ellin Mayhew MD   Signed by:   Ellin Mayhew MD on 01/09/2011   Method used:   Electronically to        [facility]* (retail)       [address]       [address] Shipping/mailing       [place], [place]  [address]       Ph: [phone]       Fax: [phone]   RxID:   [id number]  Spoke with pt on phone today about a few different things:  1) disability form for benefits- I completed the one page report claim for for continuing disability insurance benefits.  Asked staff to fax.  Will then be given to pt.  2)  binder and binder disability packet- told pt that I do not do visits for the purpose for filling for disability.  The doctors that work for the disability office do this.  Pt states that she is in the process of appealing (was denied x 1).  I told her if she would like to pick up the packet and take to the disabililty office for their md she is welcome.  She states that she will be by to pick it up.  3) I have spoke with the nueropsychology MD- Dr. [name] had recommend that i refer Ms. Stipes to. he primarily interviews pts regarding thought process, memory assessment, usually this is brain injury individuals.  he states that he would be glad to see her but in the setting of hallucination, etc it would most likely be best that I get a psychiatry referral first.  I have canceled the nueropscyhology referral and placed a psychiatry referral.  pt is aware of these changes.

## 2011-01-25 NOTE — Consult Note (Signed)
Summary: [facility]  [facility]   Imported By: Marily Memos 11/22/2010 11:26:50  _____________________________________________________________________  External Attachment:    Type:   Image     Comment:   External Document

## 2011-01-29 ENCOUNTER — Encounter: Payer: Self-pay | Admitting: *Deleted

## 2011-01-29 ENCOUNTER — Telehealth: Payer: Self-pay | Admitting: Family Medicine

## 2011-01-30 NOTE — Miscellaneous (Signed)
Summary: report from [facility] but no plan  Clinical Lists Changes Megan Dodrill, NP ( [phone])saw this pt today & pt has suicidal thought but no plan. she agreed that if they intensify or she thinks of a plan she is to go to the ED immediately. told NP I will relay this to pcp.Golden Circle RN  January 21, 2011 4:56 PM  I too have contracted for safety with pt. pt states understanding of safety plan.  I will be sure to follow up on this issue at her f/up appt.--this is already scheduled for in 2 weeks. Pt to be seen sooner as needed.  Ellin Mayhew MD  January 22, 2011 10:57 AM

## 2011-02-05 ENCOUNTER — Ambulatory Visit (HOSPITAL_COMMUNITY)
Admission: RE | Admit: 2011-02-05 | Discharge: 2011-02-05 | Disposition: A | Discharge status: Home / Self Care | Payer: Commercial Managed Care - PPO | Source: Ambulatory Visit | Attending: Family Medicine | Admitting: Family Medicine

## 2011-02-05 ENCOUNTER — Other Ambulatory Visit (HOSPITAL_COMMUNITY): Payer: Self-pay | Admitting: *Deleted

## 2011-02-05 DIAGNOSIS — R011 Cardiac murmur, unspecified: Secondary | ICD-10-CM | POA: Insufficient documentation

## 2011-02-06 ENCOUNTER — Encounter: Payer: Self-pay | Admitting: Family Medicine

## 2011-02-06 ENCOUNTER — Ambulatory Visit (INDEPENDENT_AMBULATORY_CARE_PROVIDER_SITE_OTHER): Payer: Commercial Managed Care - PPO | Admitting: Family Medicine

## 2011-02-06 VITALS — BP 124/75 | HR 85 | Ht 68.0 in | Wt 183.0 lb

## 2011-02-06 DIAGNOSIS — IMO0001 Reserved for inherently not codable concepts without codable children: Secondary | ICD-10-CM

## 2011-02-06 DIAGNOSIS — F329 Major depressive disorder, single episode, unspecified: Secondary | ICD-10-CM

## 2011-02-06 DIAGNOSIS — F3289 Other specified depressive episodes: Secondary | ICD-10-CM

## 2011-02-06 DIAGNOSIS — R011 Cardiac murmur, unspecified: Secondary | ICD-10-CM

## 2011-02-06 DIAGNOSIS — R443 Hallucinations, unspecified: Secondary | ICD-10-CM

## 2011-02-06 DIAGNOSIS — K219 Gastro-esophageal reflux disease without esophagitis: Secondary | ICD-10-CM

## 2011-02-06 DIAGNOSIS — R252 Cramp and spasm: Secondary | ICD-10-CM

## 2011-02-06 DIAGNOSIS — R42 Dizziness and giddiness: Secondary | ICD-10-CM

## 2011-02-06 LAB — BASIC METABOLIC PANEL
BUN: 16 mg/dL (ref 6–23)
Potassium: 4.4 mEq/L (ref 3.5–5.3)

## 2011-02-06 LAB — VITAMIN B12: Vitamin B-12: 269 pg/mL (ref 211–911)

## 2011-02-06 MED ORDER — PANTOPRAZOLE SODIUM 20 MG PO TBEC: 20.0000 mg | DELAYED_RELEASE_TABLET | Freq: Every day | ORAL | Status: DC

## 2011-02-06 NOTE — Assessment & Plan Note (Signed)
Continues to have auditory hallucinations- see plan under depression in problem list.

## 2011-02-06 NOTE — Patient Instructions (Signed)
I will request records from neurology and see what they have recommended.   I will also request the echo results so that I can review them. I want you to take tums as needed for reflux relief for the next week or so.  I am going to give you a medicine to help with reflux.--protonix --take as directed. We will check your b12 and electroytes today to make sure they are within normal limits and not causing hand symptoms.  The most important thing is that you see a psychiatrist ASAP- please call [facility] and let them know about your thoughts and voices--if I can be of help or you need any records please let me know. Remember if you begin to have thoughts of suicide and feel unsafe please go to the emergency department.

## 2011-02-06 NOTE — Assessment & Plan Note (Signed)
Most likely symptoms of choking 2/2 to reflux.  Will treat pt with protonix.  Pt to use tums prn for the next week or so while protonix in taking effect.

## 2011-02-06 NOTE — Assessment & Plan Note (Signed)
Will draw B12 and BMET as per plan at last visit.  If within normal limits will continue work up.  Will keep carpel tunnel on differential.  Pt not working and not doing repeatative motions so less likely carpel tunnel.   Yet pt does use walker, need to ask pt if symptoms occurred with onset of using walker and how often she has to use walker- at next visit.

## 2011-02-06 NOTE — Progress Notes (Signed)
  Subjective:    Patient ID: Megan Kemp, female    DOB: 13-Dec-1961, 50 y.o.   MRN: [id number]  HPI  Wakes up feeling like she is choking: Has been going on x 1 month- happening 3 x week.   At night wakes up with "bad taste in mouth"  Has a hx of reflux.   If eats meat seems to makes symptoms worse, avoid meat and drinking soda seems to help.  Has not taken anything for it.    Dizziness/headaches: No improvement.  Still spinning.  Neurologist took off topamax and started "orange pill" which was a different h/a medicine.  Does not remember of it.  Sometime forgets meds.  Pt is scheduled to return to neurologist in 5/11.  Hand cramping/numbness/tingling: No changes, no improvement, Continues to have hand pain (neurologist told her it was carpel tunnel),    Psych: Pt went to one psychiatrist and couldn't afford $125 payment, so had to cancel appt.  So scheduled to be seen by psychiatrist 03/12/11.  Still hearing voices, saying "don't you want to die"  Sometimes the voices talk about [place].  No plan for suicide.  Has a prayer partner that will call her each night.       Review of Systems  Constitutional: Negative for fever.  HENT: Negative for ear pain.   Eyes: Negative for discharge.  Respiratory: Negative for apnea, cough and wheezing.   Cardiovascular: Negative for chest pain and leg swelling.  Genitourinary: Negative for difficulty urinating.  Musculoskeletal: Negative for arthralgias.  Neurological: Positive for dizziness, weakness, numbness and headaches. Negative for syncope.  Psychiatric/Behavioral: Positive for hallucinations and decreased concentration.       Objective:   Physical Exam  Constitutional: She is oriented to person, place, and time. She appears well-developed and well-nourished.  HENT:  Head: Normocephalic and atraumatic.  Eyes: Pupils are equal, round, and reactive to light.       + dizziness when test extraocular movements.  No nystagmus.    Cardiovascular: Normal rate and regular rhythm.   Murmur heard. Pulmonary/Chest: Effort normal and breath sounds normal. She has no wheezes.  Abdominal: Soft. She exhibits no distension. There is no tenderness.  Musculoskeletal: She exhibits no edema.       Slowed gait, walks with walker  Neurological: She is alert and oriented to person, place, and time. No cranial nerve deficit. She exhibits normal muscle tone.       Slowed on testing of rapid alternating movments- unsure if true finding or related to decreased effort. Normal heel to shin.  Skin: Skin is warm and dry. No rash noted.  Psychiatric: She has a normal mood and affect. Her speech is normal and behavior is normal. Judgment normal. Cognition and memory are normal. She expresses suicidal ideation. She expresses no suicidal plans and no homicidal plans.       Hearing voices (as described in HPI).  Denies suicide plan.  States she feels safe and would never commit suicide.           Assessment & Plan:

## 2011-02-06 NOTE — Assessment & Plan Note (Signed)
Taking zoloft as directed.  Pt states some mild improvement in symptoms.  Pt to call [facility] to let them know about auditory hallucination and to ask for a appt sooner.  Pt contracted for safety- to go to ER if feeling unsafe.  Pt to contact me if she needs any records or needs me to talk with anyone at [facility].

## 2011-02-06 NOTE — Assessment & Plan Note (Signed)
Will request neurology note from 01/21/11.  Will update med list at that time with the new medication that has been added.  Will follow their rec's.

## 2011-02-06 NOTE — Assessment & Plan Note (Signed)
Awaiting echo results- test done on 2/14.

## 2011-02-07 ENCOUNTER — Telehealth: Payer: Self-pay | Admitting: Family Medicine

## 2011-02-07 ENCOUNTER — Encounter: Payer: Self-pay | Admitting: Family Medicine

## 2011-02-07 ENCOUNTER — Encounter: Payer: Self-pay | Admitting: Psychology

## 2011-02-07 DIAGNOSIS — F329 Major depressive disorder, single episode, unspecified: Secondary | ICD-10-CM

## 2011-02-07 NOTE — Progress Notes (Signed)
----   Converted from flag ---- ---- 01/28/2011 9:58 AM, Tessie Fass CMA wrote: Megan Kemp, pt requesting refill on pain meds and also would like to speak with you.    call back #[phone] ------------------------------  I spoke with pt.  Naproxsyn changed to prn- not scheduled.  explained this to pt.  Pt is having trouble getting in with a psychitrist.  I referred her to [facility]- told her to call them and explain her situation.  Betsy Coder, MD 01/29/11

## 2011-02-07 NOTE — Progress Notes (Signed)
Patient presented to clinic today with depressive symptoms, including suicidal ideation without intent. Note that she had already contracted for safety with her primary care physician. Patient reported that she had been feeling depressed and experiencing suicidal ideation for the past 3 months. She indicated that she had been unemployed since August 2011, but had previously worked in ____ [employer] for the past ten years. 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

## 2011-02-07 NOTE — Progress Notes (Deleted)
  Subjective:    Patient ID: Megan Kemp, female    DOB: 1961/04/25, 50 y.o.   MRN: [id number]  HPI    Review of Systems     Objective:   Physical Exam        Assessment & Plan:

## 2011-02-07 NOTE — Assessment & Plan Note (Signed)
Patient initially exhibited depressed affect and was tearful when describing her current symptoms. She brightened when the discussion shifted to addressing ways she could cope with depression and suicidal ideation, and was grateful for referrals for individual therapy. Patient exhibited tangential speech throughout the consult, but responded well to direction from me. We discussed steps she could take prior to going to the emergency department (as discussed with her physician) if she was feeling suicidal. We made a list together that included calling her prayer partner (whom she has a close relationship), looking at photos of her grandchildren (she has six), calling her boyfriend of two years, and listening to music. Patient also noted that she was interested in working with an individual therapist and I provided her with contact information for several resources in the community that work with patients who do not have insurance.  Edison Nasuti Medicine Student 11:25 am

## 2011-02-07 NOTE — Telephone Encounter (Signed)
Tried to call family services- left VM requesting earlier appt for pt.  Will wait for their return call.

## 2011-02-10 ENCOUNTER — Other Ambulatory Visit: Payer: Self-pay | Admitting: Family Medicine

## 2011-02-11 ENCOUNTER — Telehealth: Payer: Self-pay | Admitting: Family Medicine

## 2011-02-11 NOTE — Telephone Encounter (Signed)
Fwd. To Dr.Caviness

## 2011-02-11 NOTE — Telephone Encounter (Signed)
Wants to know if Megan Kemp has sent paperwork for her car yet.

## 2011-02-12 NOTE — Telephone Encounter (Signed)
Wants to talk to Dr Edmonia James - pls call

## 2011-02-13 NOTE — Telephone Encounter (Signed)
Pt now being seen Megan Kemp counseling-- Megan Kemp, therapist.  Had an appointment.  Let pt know that form now complete and will be ready for pick up after it is scanned and faxed.

## 2011-02-14 ENCOUNTER — Encounter: Payer: Self-pay | Admitting: Family Medicine

## 2011-02-14 MED ORDER — DIVALPROEX SODIUM 250 MG PO DR TAB: 250.0000 mg | DELAYED_RELEASE_TABLET | Freq: Two times a day (BID) | ORAL | Status: DC

## 2011-02-14 MED ORDER — DIAZEPAM 5 MG PO TABS: ORAL_TABLET | ORAL | Status: DC

## 2011-03-01 ENCOUNTER — Ambulatory Visit: Payer: Commercial Managed Care - PPO

## 2011-03-01 DIAGNOSIS — N949 Unspecified condition associated with female genital organs and menstrual cycle: Secondary | ICD-10-CM

## 2011-03-04 ENCOUNTER — Encounter: Payer: Self-pay | Admitting: Family Medicine

## 2011-03-04 ENCOUNTER — Ambulatory Visit (INDEPENDENT_AMBULATORY_CARE_PROVIDER_SITE_OTHER): Payer: Commercial Managed Care - PPO | Admitting: Family Medicine

## 2011-03-04 VITALS — BP 120/78 | HR 92 | Temp 98.7°F | Ht 68.0 in | Wt 186.0 lb

## 2011-03-04 DIAGNOSIS — F329 Major depressive disorder, single episode, unspecified: Secondary | ICD-10-CM

## 2011-03-04 DIAGNOSIS — M79609 Pain in unspecified limb: Secondary | ICD-10-CM

## 2011-03-04 DIAGNOSIS — M79643 Pain in unspecified hand: Secondary | ICD-10-CM

## 2011-03-04 DIAGNOSIS — R51 Headache: Secondary | ICD-10-CM

## 2011-03-04 MED ORDER — SERTRALINE HCL 50 MG PO TABS: 50.0000 mg | ORAL_TABLET | Freq: Every day | ORAL | Status: DC

## 2011-03-04 NOTE — Progress Notes (Signed)
  Subjective:    Patient ID: Megan Kemp, female    DOB: Nov 09, 1961, 50 y.o.   MRN: [id number]  HPI Depression: Seeing Remigio Eisenmenger, improved auditory hallucinations.  Still feeling "down"  Sometimes tearful.  But now keeping lights on at home,  Gardening, going to [employer].  Seeing karen Rudd weekly.  Pt states that she feels the zoloft and therapy are helping her.   Dizziness/headaches: 3 days of headaches last week.  Improved with topamax and depakote. Nuero made another med changes but pt did not bring medications with her to today's appt and doesn't remember list of medications.  Still seeing black spots- she states that neuro is aware of her symptoms and feel that it is 2/2 migraine.  Has f/up appt 05/08/11.  I have not received neuro note.  Will request records.  Hand pain: Continues to have hand pain bilateral.  Now wearing cotton gloves on hands which seems to help pain.  Pain is made worse by use of walker.  Describes senation as numbness tingling.  Worse at night.      Review of Systems     Objective:   Physical Exam  Constitutional: She appears well-developed and well-nourished.  Cardiovascular: Normal rate, regular rhythm and normal heart sounds.  Exam reveals no gallop and no friction rub.   No murmur heard. Pulmonary/Chest: Effort normal and breath sounds normal.  Musculoskeletal: Normal range of motion.       Hand exam: Full rom of wrist bilateral.  + pain of dorsal thumb with flexion into fist bilateral- right thumb worse than left.  Some pain in right wrist with flexion.  No pain with flexion of left hand. No pain with wrist extenion.  No joint redness. No joint swelling. No pain with palp of joint. Sensation grossly intact in hands bilateral.  No pain with tapping motion to inner wrist.           Assessment & Plan:

## 2011-03-04 NOTE — Patient Instructions (Signed)
Please sign Release of information form for Megan Kemp and I to exchange records. Continue taking zoloft. Try carpel tunnel wrist brace to see if this helps wrist pain.

## 2011-03-05 ENCOUNTER — Encounter: Payer: Self-pay | Admitting: Family Medicine

## 2011-03-05 LAB — POCT PREGNANCY, URINE: Preg Test, Ur: NEGATIVE

## 2011-03-07 DIAGNOSIS — M79643 Pain in unspecified hand: Secondary | ICD-10-CM | POA: Insufficient documentation

## 2011-03-07 NOTE — Assessment & Plan Note (Signed)
Will request nuero consult note to get better understanding of dx of migraine and her associated symptoms.  Pt appears to be improving.  Especially now in the setting of treatment of depression.  Pt not sure of current med list. Forgot medications for today's appt.  Will get med list from neuro note.  F/up appt with neuro in may.

## 2011-03-07 NOTE — Assessment & Plan Note (Signed)
May be 2/2 to carpel tunnel- worsened now that using walker-  Pt to try carpel tunnel brace bilateral to see if symptoms improve.  Also De quervain's and arthritist are on diffierential- dx not clear at this time.  Symptoms may be heightened by current depression.  Can consider nsaids and/or topical voltaren if symptoms persist.

## 2011-03-07 NOTE — Assessment & Plan Note (Signed)
Pt to sign a release form for release of records so I can get an updated med list and update on her current plan for depression.  No med changes today.

## 2011-03-11 ENCOUNTER — Telehealth: Payer: Self-pay | Admitting: Family Medicine

## 2011-03-11 NOTE — Telephone Encounter (Signed)
Pt calling to ask if form have been faxed in.   Needed this to be in by the 17th

## 2011-03-12 NOTE — Telephone Encounter (Signed)
This form was completed last week and was placed in the "to be faxed" pile. Please let pt know that it was completed.

## 2011-03-25 ENCOUNTER — Telehealth: Payer: Self-pay | Admitting: Family Medicine

## 2011-03-25 LAB — COMPREHENSIVE METABOLIC PANEL
ALT: 18 U/L (ref 0–35)
CO2: 28 mEq/L (ref 19–32)
Calcium: 8.3 mg/dL — ABNORMAL LOW (ref 8.4–10.5)
GFR calc non Af Amer: >60 mL/min (ref >60)
Glucose, Bld: 91 mg/dL (ref 70–99)
Sodium: 137 mEq/L (ref 135–145)
Total Bilirubin: 0.1 mg/dL — ABNORMAL LOW (ref 0.3–1.2)

## 2011-03-25 LAB — DIFFERENTIAL
Basophils Absolute: 0 10*3/uL (ref 0.0–0.1)
Basophils Relative: 0 % (ref 0–1)
Eosinophils Absolute: 0.2 10*3/uL (ref 0.0–0.7)
Lymphs Abs: 1.6 10*3/uL (ref 0.7–4.0)
Neutrophils Relative %: 34 % — ABNORMAL LOW (ref 43–77)

## 2011-03-25 LAB — CBC
Hemoglobin: 12.8 g/dL (ref 12.0–15.0)
MCHC: 33 g/dL (ref 30.0–36.0)
MCV: 93.1 fL (ref 78.0–100.0)
RBC: 4.18 MIL/uL (ref 3.87–5.11)

## 2011-03-25 LAB — URINALYSIS, ROUTINE W REFLEX MICROSCOPIC
Ketones, ur: NEGATIVE mg/dL
Nitrite: NEGATIVE
Protein, ur: NEGATIVE mg/dL
pH: 7 (ref 5.0–8.0)

## 2011-03-25 LAB — LIPASE, BLOOD: Lipase: 24 U/L (ref 11–59)

## 2011-03-25 NOTE — Telephone Encounter (Signed)
Patient dropped off disability forms to be filled out.  Please fax when completed. °

## 2011-03-27 NOTE — Telephone Encounter (Signed)
Paperwork completed.  Placed in the to be faxed pile in office.  Please call pt for pick up once document scanned into chart and faxed.  Will route to admin team.

## 2011-04-08 ENCOUNTER — Ambulatory Visit (INDEPENDENT_AMBULATORY_CARE_PROVIDER_SITE_OTHER): Payer: Commercial Managed Care - PPO | Admitting: Family Medicine

## 2011-04-08 ENCOUNTER — Encounter: Payer: Self-pay | Admitting: Family Medicine

## 2011-04-08 ENCOUNTER — Telehealth: Payer: Self-pay | Admitting: Family Medicine

## 2011-04-08 VITALS — BP 134/89 | HR 78 | Temp 98.4°F | Wt 185.1 lb

## 2011-04-08 DIAGNOSIS — IMO0002 Reserved for concepts with insufficient information to code with codable children: Secondary | ICD-10-CM

## 2011-04-08 DIAGNOSIS — K219 Gastro-esophageal reflux disease without esophagitis: Secondary | ICD-10-CM

## 2011-04-08 DIAGNOSIS — M79609 Pain in unspecified limb: Secondary | ICD-10-CM

## 2011-04-08 DIAGNOSIS — F329 Major depressive disorder, single episode, unspecified: Secondary | ICD-10-CM

## 2011-04-08 DIAGNOSIS — M79643 Pain in unspecified hand: Secondary | ICD-10-CM

## 2011-04-08 DIAGNOSIS — G43909 Migraine, unspecified, not intractable, without status migrainosus: Secondary | ICD-10-CM

## 2011-04-08 DIAGNOSIS — T148XXA Other injury of unspecified body region, initial encounter: Secondary | ICD-10-CM

## 2011-04-08 DIAGNOSIS — R52 Pain, unspecified: Secondary | ICD-10-CM

## 2011-04-08 DIAGNOSIS — F3289 Other specified depressive episodes: Secondary | ICD-10-CM

## 2011-04-08 MED ORDER — PANTOPRAZOLE SODIUM 20 MG PO TBEC: 20.0000 mg | DELAYED_RELEASE_TABLET | Freq: Every day | ORAL | Status: DC

## 2011-04-08 MED ORDER — NAPROXEN 500 MG PO TABS: 500.0000 mg | ORAL_TABLET | Freq: Two times a day (BID) | ORAL | Status: DC | PRN

## 2011-04-08 MED ORDER — SERTRALINE HCL 50 MG PO TABS: 50.0000 mg | ORAL_TABLET | Freq: Every day | ORAL | Status: DC

## 2011-04-08 NOTE — Assessment & Plan Note (Signed)
Pt states protonix is helping reflux symptoms.  Will give refill.

## 2011-04-08 NOTE — Assessment & Plan Note (Signed)
Controlled with naprosyn prn.  Will continue to monitor.

## 2011-04-08 NOTE — Assessment & Plan Note (Addendum)
Neurology treating with topamax and depakote.  Seems to be improving.  F/up appt in 5/12 with neuro.  Pt continues to percieve problems with balance and coordination although on previous exam able to do all tasks, just slowed overall.  Will refer pt to PT for help on percieved balance and coordination and speed of gait.

## 2011-04-08 NOTE — Patient Instructions (Signed)
Start taking calcium and vit D supplement Use hydrocortisone cream and keep area on right upper arm covered at night so you do not pick at it. I will prescribe PT and we will call you your appointment.  I will try to touch base with Remigio Eisenmenger so that we are on the same page as far as your treatment for your depression.

## 2011-04-08 NOTE — Progress Notes (Signed)
  Subjective:    Patient ID: Megan Kemp, female    DOB: 04-29-1961, 50 y.o.   MRN: [id number]  HPI  Migraines: Now 3 x week will have a migraine.  Seems to be worse with stress. But definitely improved on current medication regimen. Continues to have dizziness.  Still feels "off balance" and feels she needs to walk with walker.    Depression: Irritable, not sleeping well, not able to focus or concentrate.  Still seeing Remigio Eisenmenger weekly.  Some auditory hallucinations- voice saying "Go get em" Seem to occur at night.  She will jump out of bed before realizing that these are hallucinations.   States she feels angry and irritable.  Denies SI or HI.  Also has a decreased interest in sex. Having problems staying active.   Leg and hand cramping: Continues to have occasional had and leg cramping off and on.  Improves with massage.    SH: still feels she is not able to work due to above symptoms.  Is not seeking employment at this time.  Continues to seek out disability.    Review of Systems    as per above Objective:   Physical Exam  Constitutional: She is oriented to person, place, and time. She appears well-developed and well-nourished.  HENT:  Head: Normocephalic and atraumatic.  Cardiovascular: Normal rate and regular rhythm.   Murmur heard. Pulmonary/Chest: Effort normal and breath sounds normal. No respiratory distress. She has no wheezes.  Abdominal: She exhibits no distension.  Musculoskeletal: She exhibits no edema.       Gait slowed, but smooth and equal.  Pauses on initially standing 2/2 feeling dizziness. Moving all 4 ext equally.  5/5 strength in all 4 ext.  Normal sensation in all 4 ext.   Right upper arm: Lateral aspect of arm round circular hyperpigmented area size of quarter with scab and abrasion marks present surrounding lesion.  Neurological: She is alert and oriented to person, place, and time.  Psychiatric: She has a normal mood and affect. Her speech is normal  and behavior is normal. She expresses no homicidal and no suicidal ideation.       See assessment in HPI. + auditory halluciations.  Requested that I leave door open for entire visit 2/2 anxiety.          Assessment & Plan:

## 2011-04-08 NOTE — Assessment & Plan Note (Signed)
Will call Megan Kemp to discuss pt care.  It is unclear if [facility] will be monitoring medications or if I should titrate and monitor psych medications.  Will call to clarify.

## 2011-04-08 NOTE — Assessment & Plan Note (Signed)
Right arm abrasion in right upper arm, wound appears to have abrasion marks and most likely isn't healing 2/2 trauma caused by pt.  Pt is to apply hydrocortisone cream and then to keep covered to help her avoid causing trauma to area.

## 2011-04-08 NOTE — Telephone Encounter (Signed)
Called pt counselor Remigio Eisenmenger to see if [facility] is managing psych medications, also to see how we can collaborate care.  Not available.  Message left.

## 2011-04-11 ENCOUNTER — Telehealth: Payer: Self-pay | Admitting: Family Medicine

## 2011-04-11 NOTE — Telephone Encounter (Signed)
Disability form dropped off to be filled out.  Please fax to [phone] when completed.  Placed in dr's box.

## 2011-04-15 ENCOUNTER — Telehealth: Payer: Self-pay | Admitting: Family Medicine

## 2011-04-15 NOTE — Telephone Encounter (Signed)
Is still having hand pain and would like to know if she could get a pain patch for it.  Sharl [facility]

## 2011-04-17 ENCOUNTER — Ambulatory Visit: Payer: Commercial Managed Care - PPO

## 2011-04-18 ENCOUNTER — Encounter: Payer: Self-pay | Admitting: Family Medicine

## 2011-04-18 ENCOUNTER — Ambulatory Visit (INDEPENDENT_AMBULATORY_CARE_PROVIDER_SITE_OTHER): Payer: Commercial Managed Care - PPO | Admitting: Family Medicine

## 2011-04-18 ENCOUNTER — Telehealth: Payer: Self-pay | Admitting: Family Medicine

## 2011-04-18 VITALS — BP 90/58 | HR 88 | Temp 98.9°F | Ht 68.0 in | Wt 185.0 lb

## 2011-04-18 DIAGNOSIS — K219 Gastro-esophageal reflux disease without esophagitis: Secondary | ICD-10-CM

## 2011-04-18 DIAGNOSIS — R1310 Dysphagia, unspecified: Secondary | ICD-10-CM | POA: Insufficient documentation

## 2011-04-18 MED ORDER — ONDANSETRON HCL 4 MG PO TABS: 4.0000 mg | ORAL_TABLET | Freq: Three times a day (TID) | ORAL | Status: DC | PRN

## 2011-04-18 MED ORDER — PANTOPRAZOLE SODIUM 40 MG PO TBEC: 40.0000 mg | DELAYED_RELEASE_TABLET | Freq: Two times a day (BID) | ORAL | Status: DC

## 2011-04-18 NOTE — Progress Notes (Signed)
  Subjective:    Patient ID: Megan Kemp, female    DOB: 1961/10/26, 50 y.o.   MRN: [id number]  HPI Nausea and vomiting: Tightness in chest, + nausea, happens every time that she drinks liquids or eats food.  Improves if she eats soft foods.  Feels like the food get stucks 1/2 way down.   Had similar problem approx 10 years ago and after time it resolved. She had a barium swallow at that time (aug, 16, 2002) and the results were normal.  No h/o alcohol and no tobacco use.     Review of Systems No weight loss. No diarrhea. No blood in stool.  No bloody sputum    Objective:   Physical Exam  Constitutional: She is oriented to person, place, and time. She appears well-developed and well-nourished.  Cardiovascular: Normal rate and regular rhythm.   Murmur heard. Pulmonary/Chest: Effort normal and breath sounds normal. No respiratory distress. She has no wheezes.       + tenderness on palpation of sternum area.  Abdominal: Soft. Bowel sounds are normal. She exhibits no distension. There is no tenderness. There is no rebound and no guarding.  Musculoskeletal: She exhibits no edema.  Neurological: She is alert and oriented to person, place, and time.  Skin: Skin is warm and dry.  Psychiatric: She has a normal mood and affect. Her behavior is normal.          Assessment & Plan:

## 2011-04-18 NOTE — Telephone Encounter (Signed)
Claim form complete.  Have placed form in the fax pile in administrative office.  I need to call pt regarding another question she had so i will call her and let her know that the form is complete.

## 2011-04-18 NOTE — Patient Instructions (Signed)
Stop naprosyn- this can cause nausea and vomiting. Take zofran as directed for nausea. I am going to increase your protonix to 40mg  by mouth 2 x day.  Return for your regular scheduled appt but if any new or worsening return sooner.

## 2011-04-18 NOTE — Telephone Encounter (Signed)
Pt states that she has had nausea and vomiting after meals since approx 4/19.  Recommended pt be seen.  Worked in to my afternoon clinic today.  Will make treatment plan at that time.

## 2011-04-18 NOTE — Telephone Encounter (Signed)
Attempted to reach pt at the above number, says "the telephone number you have entered is not valid". Disability forms have been faxed, copy sent off to be scanned & placed original at the front desk for p/u.

## 2011-04-18 NOTE — Telephone Encounter (Signed)
Called pt back.  Left message for pt.  I recommend a trial of Capsaicin cream to back of hands.  Left brief message stating this.  Encouraged pt to call back to discuss in more detail.

## 2011-04-19 NOTE — Assessment & Plan Note (Signed)
This could be 2/2 reflux.  Also can not r/o dysmotility issue or esophageal stenosis.  zofran for nausea.  Will max out dosage for protonix at 40mg  bid to see if this helps discomfort.  Pt to stop taking naproxsyn. Will place order for EGD.  Pt has orange card- no insurance- so unsure how long it will take to get this needed study.  Will medically manage until then.

## 2011-04-19 NOTE — Assessment & Plan Note (Signed)
May be the cause of dysphagia- see plan under dysphagia

## 2011-04-22 ENCOUNTER — Telehealth: Payer: Self-pay | Admitting: *Deleted

## 2011-04-22 NOTE — Telephone Encounter (Signed)
Called patient to bring in a copy of orange card, need in order to send referral to project access. Patient says she will bring in a copy this afternoon.Megan Kemp, Rodena Medin

## 2011-05-07 NOTE — Group Therapy Note (Signed)
NAME:  Megan Kemp, Megan Kemp               ACCOUNT NO.:  [id number]   MEDICAL RECORD NO.:  [id number]          PATIENT TYPE:  WOC   LOCATION:  [facility]                   FACILITY:  [facility]   PHYSICIAN:  Deirdre Christy Gentles, CNM       DATE OF BIRTH:  03/12/1961   DATE OF SERVICE:  02/15/2009                                  CLINIC NOTE   REASON FOR VISIT:  Annual exam.   HISTORY:  This is a 50 year old G1 P1-0-0-1 status post BTL who has not  been sexually active for 10 years and having regular cycles, although  she did have an endometrial ablation 3 years ago for her DUB with  fibroids.  LMP was February 15,  normal.  She has no concerns, although  she does have some lower left quadrant pain which is crampy in nature  and can be cyclical with her periods.  She feels it is due to her  fibroids.  It is not incapacitating, it does not interfere with her  activity.  She is a nonsmoker, eats well, takes one vitamin a day.  She  works in hospital transport evenings and does a lot of walking and  exercise at her work.  She wears seat belts always and is a nonsmoker.   MEDICATIONS:  She sometimes takes ibuprofen 800 every 8 hours and is on  vitamin one a day.   ALLERGIES:  None.   LABORATORY:  Results from last visit:  She had been complaining of  fatigue, so Dr. Sharol Harness checked her thyroid which was normal with TSH at  0.623 and her CBC which was also all within normal limits with a  hemoglobin of 13.  No labs done today.   PHYSICAL EXAM:  Temperature 97.9, pulse 71, BP 124/79, weight 166.9.  GENERAL:  WNWD NAD.  Thyroid NS normal shaped, smooth, no masses.  CORONARY:  RRR without murmur.  LUNGS:  Clear to auscultation.  BREASTS:  Pendulous.  No discrete masses.  No lymphadenopathy.  No  dimpling.  There is a healing superficial skin lesion about 1 cm.  Sounds like a furuncle that is now not inflamed.  ABDOMEN:  Soft, flat, is essentially nontender.  Uterus is palpable  above symphysis.  PELVIC  EXAM:  NAFG.  BUS negative.  Vagina rugated without abnormal  discharge.  Cervix posterior, parous.  Os appears somewhat distended, no  lesions.  Pap is done.  Bimanual:  Uterus is enlarged about 12-14 weeks'  size, consistency very firm consistent with fibroids.  Ovaries not felt.   ASSESSMENT:  Normal gynecological except myomatous uterus which is  essentially asymptomatic.  Her DUB is resolved status post endometrial  ablation.   PLAN:  Discussed diet, exercise, seat belts, calcium to increase to 1200  mg a day and add vitamin D3 to 1000 a day.  If this Pap is normal, she  will change to every 2-3-year Paps as long as her bleeding pattern  remains normal as well.           ______________________________  Caren Griffins, CNM     DP/MEDQ  D:  02/15/2009  T:  02/15/2009  Job:  [id number]

## 2011-05-09 ENCOUNTER — Telehealth: Payer: Self-pay | Admitting: Family Medicine

## 2011-05-09 NOTE — Telephone Encounter (Signed)
Called pt. Liver functions were not checked at last visit. Pt was seen by her urologist today and he asked her about LFT's. I told the pt to please have the notes from her urologist faxed to [facility]. She agreed. Pt request for Dr.Caviness to call her. Fwd. To Dr.C. .Arlyss Repress

## 2011-05-09 NOTE — Telephone Encounter (Signed)
Pt needs to know if we checked her liver functions the last time she had blood work done?

## 2011-05-10 ENCOUNTER — Encounter: Payer: Self-pay | Admitting: Family Medicine

## 2011-05-10 NOTE — Group Therapy Note (Signed)
Megan Kemp, Megan Kemp               ACCOUNT NO.:  [id number]   MEDICAL RECORD NO.:  [id number]          PATIENT TYPE:  WOC   LOCATION[facility]  [facility]                   FACILITY:  [facility]   PHYSICIAN:  Elsie Lincoln, MD      DATE OF BIRTH:  [birth year]   DATE OF SERVICE:  05/28/2006                                    CLINIC NOTE   The patient is a 50 year old female with dysfunctional uterine bleeding and  probable endometrial polyp.  The patient has D&C, hysteroscopy, and  endometrial oblation scheduled July 01, 2006.  She is taking Loestrin-24 for  cycle control, which is not really helping that much.  However, she is  having surgery soon.  The patient did want an STD screen for a broken  condom.  She wants Trichomonas, GC, and Chlamydia.  She refuses hepatitis B,  hepatitis C, and HIV at this time.   PHYSICAL EXAMINATION:  Tanner 5.  Vagina pink.  Normal rugae.  No abnormal  discharge.  Cervix is closed, nontender.   ASSESSMENT AND PLAN:  A 50 year old female for STD screening, and plan for  surgery July 01, 2006.           ______________________________  Elsie Lincoln, MD     KL/MEDQ  D:  05/28/2006  T:  05/29/2006  Job:  [id number]

## 2011-05-10 NOTE — Op Note (Signed)
NAMESANTOSHA, JIVIDEN               ACCOUNT NO.:  [id number]   MEDICAL RECORD NO.:  [id number]          PATIENT TYPE:  AMB   LOCATION:  [facility]                         FACILITY:  [facility]   PHYSICIAN:  Marlowe Kays, M.D.  DATE OF BIRTH:  22-May-1961   DATE OF PROCEDURE:  03/07/2005  DATE OF DISCHARGE:                                 OPERATIVE REPORT   PREOPERATIVE DIAGNOSIS:  Suspected foreign bodies, left calf (thorns).   POSTOPERATIVE DIAGNOSIS:  Suspected foreign bodies, left calf (thorns).   OPERATION:  Exploration, left calf, with excision of one small lesion and  exploration and debridement of the second larger lesion.   SURGEON:  Marlowe Kays, M.D.   ASSISTANT:  Nurse.   ANESTHESIA:  General.   INDICATIONS FOR PROCEDURE:  She had a number of thorns embedded in her left  calf roughly eight months ago.  She has had persistent pain and tenderness  over two of the multiple lesions over her lateral left leg and calf.  X-rays  have been normal.  She feels certain that there are some retained thorns in  her calf, and because of the persistent problems, I elected to go ahead and  explore these areas.  She understands that we may or may not find definite  residual thorns.   PROCEDURE:  Pneumatic tourniquet.  The leg was esmarched out nonsterilely  and prepped from tourniquet to ankle with Duraprep.  Draped in a sterile  field.  The smaller lesion I excised elliptically, and there was one area  that was thickened in the subcutaneous tissue.  We sent the whole excised  specimen to pathology.  The wound was irrigated with sterile saline and  infiltrated with 0.5% Marcaine with 0.25% adrenaline and closed with  interrupted 4-0 nylon mattress sutures.  The second lesion was posterior,  enlarged.  I was afraid an excision of it would leave a very narrow width of  tissue which might be come devascularized, so consequently, I made a  posterior curved incision along its border and raised a  flap and dissected  out all of the subcutaneous tissue between the skin and the underlying  muscle.  Several layers were a little thickened.  There was no definite  visible foreign body, but I spent all of the suspected tissue to pathology.  Once again, irrigated the wound well, infiltrated it with Marcaine and  adrenaline, and closed it with interrupted 4-0 nylon mattress sutures.  Betadine, Adaptic, and dry sterile dressing were applied.  The tourniquet  was released.  At the time of this dictation, she was on her way to the  recovery room in satisfactory condition with no known complications.      JA/MEDQ  D:  03/07/2005  T:  03/07/2005  Job:  [id number]

## 2011-05-10 NOTE — Group Therapy Note (Signed)
[name], Megan Kemp               ACCOUNT NO.:  [id number]   MEDICAL RECORD NO.:  [id number]          PATIENT TYPE:  WOC   LOCATION[facility]                   [facility]ACILITY:  [facility]   PHYSICIAN:  Dorthula Perfect, MD     DATE OF BIRTH:  Oct 06, 1961   DATE OF SERVICE:  08/14/2006                                    CLINIC NOTE   This 50 year old black female returns for postoperative follow-up having had  endometrial ablation performed on July 10.  Endometrial ablation was done  because of abnormal dysfunctional uterine bleeding and probable endometrial  polyp.  Ultrasound had revealed a slightly enlarged uterus with multiple  uterine fibroids.  Prior to the endometrial ablation her monthly periods  lasted for seven days, was described as being extremely heavy necessitating  the use of five or more pads all soaking wet with staining of her underwear  and bed sheets.  Last week she had her first period.  She describes it as  lasting only one and a half days and being only spotting.  She used to have  bad pain with her periods and with this bleeding episode last week she only  had some mild cramps.   Prior to her surgery and since she has low abdominal discomfort for which  she uses ibuprofen.  She has no GI problems such as constipation or  diarrhea.  She has no bladder problems, although she rarely will leak a  little bit of urine.  She is currently not sexually active.   Abdomen soft, flat, nontender.  Pelvic examination:  External genitalia,  BUS, __________ vaginal vault epithelialized as was the cervix.  Uterus is  slightly enlarged, perhaps 8-10 weeks, somewhat nodular, and is moderately  tender.  Ovaries could not be felt on either side.   IMPRESSION:  1. Status post endometrial ablation for dysfunctional uterine bleeding.  2. Uterine fibroids.   DISPOSITION:  1. Patient will return to work on a full-time basis.  2. Prescription for ibuprofen 800 mg, #30 with one refill.  3. She  is to return in six months and in the interim keep track of what      her periods are like and also this low abdominal discomfort that she      has.           ______________________________  Dorthula Perfect, MD     ER/MEDQ  D:  08/14/2006  T:  08/14/2006  Job:  [id number]

## 2011-05-10 NOTE — Telephone Encounter (Signed)
Neurologist asked pt if I was monitoring her liver enzymes after they started the depokote.  I was under the understanding that neurology was going to be monitoring her lab work.  Pt requests to have her lab work done at my office.  Pt states that for the past month she has only been taking the protonix b/c of her reflux symptoms.  Pt states that reflux is now better with protonix but that she has not yet restarted medications.  Pt is to restart meds and then f/up with me.  We will do labwork at her next appt after she starts depakote again.

## 2011-05-10 NOTE — Group Therapy Note (Signed)
Megan Kemp, Megan Kemp               ACCOUNT NO.:  [id number]   MEDICAL RECORD NO.:  [id number]          PATIENT TYPE:  WOC   LOCATION[facility]  [facility]                   FACILITY:  [facility]   PHYSICIAN:  Karlton Lemon, MD      DATE OF BIRTH:  [birth year]   DATE OF SERVICE:  08/11/2007                                  CLINIC NOTE   CHIEF COMPLAINT:  Yearly well-woman examination.   HISTORY OF PRESENT ILLNESS:  This is a 50 year old female presenting for  yearly Pap smear and breast exam.  The patient states that she is fairly  healthy, but has been feeling more fatigued and worn out.  She says she  has a history of anemia and would like a medication to help with her  fatigue.  She states that she has not had a history of thyroid problems,  and is not currently taking any supplementation for anemia other than  multivitamin daily.  She is, otherwise, doing well, having regular  monthly periods that last 3 days.  She has no complaints of discharge,  vaginal itching, dysuria, chest pain or shortness of breath.   PAST MEDICAL HISTORY:  Anemia.   MEDICATIONS:  None.   ALLERGIES:  CODEINE which causes sleepiness.   SOCIAL HISTORY:  Denies tobacco, alcohol, drugs.   PHYSICAL EXAMINATION:  This is a well-appearing female in no distress.  She is alert and oriented x3.  CARDIOVASCULAR:  Heart is regular rate and rhythm, no murmurs, rubs, or  gallops are appreciated.  RESPIRATORY:  Lungs are clear to auscultation bilaterally.  BREAST EXAM:  Breasts are symmetric bilaterally without palpable mass,  dimpling, nipple retraction or nipple discharge.  ABDOMEN:  Soft, nontender to palpation.  Bowel sounds auscultate in all  quadrants.  GENITOURINARY:  Normal external female genitalia.  Vaginal mucosa is  pink and moist.  Cervix is slightly displaced to the patient's right.  There is a small amount of ectropion.  Pap smear is collected.  Bimanual  exam elicits normal uterus in size that is retroverted and  deviated to  the patient's right.  Ovaries are difficult to palpate.   ASSESSMENT AND PLAN:  This is a 50 year old female presenting for yearly  well-woman examination.  1. Pelvic exam and Pap smear have been performed and will contact the      patient with the results and plan for followup.  2. Breast exam is normal without mass or retraction or dimpling of the      nipple noted.  The patient states that she had a mammogram in June      that was normal.  3. Will check TSH and CBC to assess for fatigue.  The patient does      have a history of anemia and will assess her      hemoglobin level and treat as needed.  4. The patient is to follow with her primary care physician for      further evaluation.           ______________________________  Karlton Lemon, MD     NS/MEDQ  D:  09/11/2007  T:  09/11/2007  Job:  [id number]

## 2011-05-10 NOTE — Group Therapy Note (Signed)
NAME:  Megan Kemp, Megan Kemp NO.:  [id number]   MEDICAL RECORD NO.:  [id number]          PATIENT TYPE:  WOC   LOCATION[facility]                   [facility]ACILITY:  [facility]   PHYSICIAN:  Ellis Parents, MD    DATE OF BIRTH:  12/10/1961   DATE OF SERVICE:                                    CLINIC NOTE   This 50 year old gravida 1, para 1, African American female comes in  complaining of irregular vaginal bleeding for the past 6-8 weeks.  The  patient's menses have been cyclic, up until early October, although heavy,  requiring 4-5 pads a day for six days.  But, in the past six weeks, she has  had frequent bleeding episodes lasting 7-10 days, stopping for a few days,  spotting and then bleeding again for 4-5 days.  This has been associated  with some mild uterine cramps.  The patient has known fibroids and was in  MAU.  On October 19, 2005 ultrasound revealed multiple small fibroids, the  largest of which was 5.6 x 4.6 x 3 cm.  There was an appearance suggestive  of a submucosal fibroid measuring 1.2 cm.  The patient's hemoglobin at that  time was 11.6 grams.  GC culture and Chlamydia were negative.   Today physical examination reveals the vagina containing a very slight  amount of pinkish tinged blood.  The cervix was clean.  The uterus was  irregular with some small fibroids with a moderately good size of  approximately 10-12 weeks maximum.  A Pap smear was taken, followed by an  endometrial biopsy.  The uterus sounded to 8 cm in depth and a good specimen  with two passes was obtained.   The patient was started on Loestrin 24 to begin tonight for two cycles.  She  is also advised to take ferrous sulfate 325 mg b.i.d.  The patient is to  return in two months.  If the OCs do not control this pattern, then a office  hysteroscopy should be performed.           ______________________________  Ellis Parents, MD     SA/MEDQ  D:  11/05/2005  T:  11/06/2005  Job:  [id number]

## 2011-05-10 NOTE — Group Therapy Note (Signed)
Megan Kemp, Megan Kemp               ACCOUNT NO.:  [id number]   MEDICAL RECORD NO.:  [id number]          PATIENT TYPE:  WOC   [facility]ATION[facility]                   [facility]ACILITY[facility]   PHYSICIAN:  Elsie Lincoln, MD      DATE OF BIRTH:  Apr 04, 1961   DATE OF SERVICE:                                    CLINIC NOTE   HISTORY OF PRESENT ILLNESS:  The patient is a 50 year old female in followup  for abnormal uterine bleeding.  The patient had a negative endometrial  biopsy, and most likely has submucosal fibroids that are causing her  bleeding.  She does have some pain that is controlled with naproxen sodium.  I offered her a hysterectomy or Hydrotherm ablation after discussion of the  each procedure, the risks, benefits and alternatives.  Patient elects for  [facility] ablation given that she has submucosal fibroids.  She does not  want the procedure done until July secondary to starting a new job and  taking time off in May for personal reasons.  Her bleeding was controlled  with Loestrin 24.  However, she ran out.  We will restart her on that as a  bridge to the [facility] ablation.  She was noted to have an endometrial  polyp, and we will do a D&C at the time of the [facility] ablation.  The  patient understands the risks of the procedure including but not limited to  bleeding, infection, damage to the back of the uterus and burning of the  vagina.  She was given 2 samples of Loestrin 24, and given a prescription  for Loestrin 24.  She will be on continuous therapy to not have a period.  She was instructed on how to do this.  Return to clinic in June for  surveillance for oral contraceptives.           ______________________________  Elsie Lincoln, MD     KL/MEDQ  D:  03/26/2006  T:  03/27/2006  Job:  [id number]

## 2011-05-16 ENCOUNTER — Encounter: Payer: Self-pay | Admitting: Family Medicine

## 2011-05-16 ENCOUNTER — Ambulatory Visit (INDEPENDENT_AMBULATORY_CARE_PROVIDER_SITE_OTHER): Payer: Self-pay | Admitting: Family Medicine

## 2011-05-16 DIAGNOSIS — R1310 Dysphagia, unspecified: Secondary | ICD-10-CM

## 2011-05-16 DIAGNOSIS — R011 Cardiac murmur, unspecified: Secondary | ICD-10-CM

## 2011-05-16 DIAGNOSIS — F411 Generalized anxiety disorder: Secondary | ICD-10-CM | POA: Insufficient documentation

## 2011-05-16 DIAGNOSIS — R51 Headache: Secondary | ICD-10-CM

## 2011-05-16 DIAGNOSIS — F329 Major depressive disorder, single episode, unspecified: Secondary | ICD-10-CM

## 2011-05-16 DIAGNOSIS — H269 Unspecified cataract: Secondary | ICD-10-CM

## 2011-05-16 DIAGNOSIS — D259 Leiomyoma of uterus, unspecified: Secondary | ICD-10-CM

## 2011-05-16 DIAGNOSIS — D573 Sickle-cell trait: Secondary | ICD-10-CM

## 2011-05-16 DIAGNOSIS — K219 Gastro-esophageal reflux disease without esophagitis: Secondary | ICD-10-CM

## 2011-05-16 DIAGNOSIS — R443 Hallucinations, unspecified: Secondary | ICD-10-CM

## 2011-05-16 DIAGNOSIS — R252 Cramp and spasm: Secondary | ICD-10-CM

## 2011-05-16 DIAGNOSIS — G43909 Migraine, unspecified, not intractable, without status migrainosus: Secondary | ICD-10-CM

## 2011-05-16 NOTE — Patient Instructions (Signed)
Return in 2 months for follow up.  Take your medications and we will recheck you labs at that visit.

## 2011-05-16 NOTE — Assessment & Plan Note (Signed)
Will continue with counseling at green light pt to restart antidepressant medication.

## 2011-05-16 NOTE — Assessment & Plan Note (Signed)
Pt continues to have hand cramping- pt to try capsicin cream.  Has not yet tried this.

## 2011-05-16 NOTE — Assessment & Plan Note (Signed)
Pt offered EGD but doesn't want to pay out of pocket.  No project access volunteers at this time.  Will continue tx with protonix.  Symptoms have improved with this treatment.

## 2011-05-16 NOTE — Assessment & Plan Note (Signed)
Recheck labwork at f/up appt in 1-2 months.

## 2011-05-16 NOTE — Assessment & Plan Note (Signed)
Migraine h/a---  Pt being treatment with topamax and depakote.  Pt has not taken medications x 4 weeks.  Will restart medications.  Will recheck at f/up appt in 1-2 months CMET, CBC,  serum drug levels and ammonia.

## 2011-05-16 NOTE — Assessment & Plan Note (Signed)
Continue protonix  

## 2011-05-16 NOTE — Assessment & Plan Note (Signed)
When depression severe in late 2011 and early 2012- had auditory hallucinations stating.  " suicide, suicide"  No hallucinations at this time.

## 2011-05-16 NOTE — Progress Notes (Signed)
  Subjective:    Patient ID: Megan Kemp, female    DOB: Feb 13, 1961, 50 y.o.   MRN: [id number]  HPI Nausea and vomiting: Reflux, much better with protonix medication.  Had stopped taking all other medications since the start of n/v and reflux issues (approx 4 weeks).  Still has food get stuck but much improved.  Taking smaller bites.  Still having vomiting with certain foods.  Chicken and potatoes do well.  Has some vomiting with other meats.  Pt aware that she is on the list for a endocopic eval.  Pt states she doesn't want to pay out of pocket for EGD but will let me know if she changes her mind and would like to pay out of pocket.  H/A: 3 x week, lasts 2 hours, improved in past.  Hasn't been on medications(topamax or depakote) x 4 weeks.  Hasn't taken b/c of reflux.  But now improved reflux so going to try to restart.    Depression: About the same as in past.  Pt has a new counselor and is going to meet her today. No SI or HI.  Does states that she has some anger issues.  Was very depressed on mother's day as she thought about mother's death.  Otherwise has "been doing well."  SH: Pt states that she is in the process of appealing in front of the court for disability.   Review of Systems No weight loss. No diarrhea. No blood in stool.  No bloody sputum    Objective:   Physical Exam  Constitutional: She is oriented to person, place, and time. She appears well-developed and well-nourished.  Cardiovascular: Normal rate and regular rhythm.   Murmur heard. Pulmonary/Chest: Effort normal and breath sounds normal. No respiratory distress. She has no wheezes.  Abd- soft, NT, ND Musculoskeletal: She exhibits no edema.  Neurological: She is alert and oriented to person, place, and time. moving all 4 ext.  Normal gait, even stride, using walker to ambulate. Skin: Skin is warm and dry.  Psychiatric: She has a normal mood and affect. Her behavior is normal.          Assessment & Plan:    Subjective:    Patient ID: Megan Kemp, female    DOB: 1960/12/29, 50 y.o.   MRN: [id number]  HPI    Review of Systems     Objective:   Physical Exam        Assessment & Plan:

## 2011-05-17 ENCOUNTER — Ambulatory Visit (INDEPENDENT_AMBULATORY_CARE_PROVIDER_SITE_OTHER): Payer: Self-pay

## 2011-05-17 DIAGNOSIS — D259 Leiomyoma of uterus, unspecified: Secondary | ICD-10-CM

## 2011-05-17 DIAGNOSIS — N949 Unspecified condition associated with female genital organs and menstrual cycle: Secondary | ICD-10-CM

## 2011-05-18 NOTE — Group Therapy Note (Signed)
Megan Kemp, Megan Kemp               ACCOUNT NO.:  [id number]  MEDICAL RECORD NO.:  [id number]           PATIENT TYPE:  A  [facility]ATION[facility]  [facility]                   FACILITY:  [facility]  PHYSICIAN:  Catalina Antigua, MD     DATE OF BIRTH:  12/25/1960  DATE OF SERVICE:  05/17/2011                                 CLINIC NOTE  This is a 50 year old, G1, P55, LMP on Apr 29, 2011, with history of dysfunctional uterine bleeding and fibroid uterus, status post endometrial ablation approximately 4 years ago with persistent vaginal bleeding, currently managed with Depo-Provera.  The patient presents today stating that she wishes to discuss other alternative to the Depo- Provera as she has continued spotting, not daily, but she is unhappy with the fact that she has to wear a pad daily as the result of her occasional vaginal spotting.  The patient was last seen in December 2011 and was counseled regarding the use of Mirena IUD versus repeat endometrial ablation.  At this time, the patient is interested in the Mirena IUD.  Information was provided.  [employer] form was also given.  The patient will return to schedule the Mirena IUD insertion. The patient is in a hurry to leave as she has a funeral to attend in the afternoon and the patient is also planning on going away on vacation in June, and for that purpose, I would like to continue with Depo-Provera for now as to prevent heavy vaginal bleeding.  The patient reports that her spotting seems to occur around the time that the Depo-Provera is due.  It was explained to the patient that this is a normal finding with the Depo-Provera.  However, the patient still wishes to proceed with Mirena IUD.  The patient will therefore receive Depo-Provera today and will return for Mirena IUD insertion.          ______________________________ Catalina Antigua, MD    PC/MEDQ  D:  05/17/2011  T:  05/18/2011  Job:  [id number]

## 2011-05-27 ENCOUNTER — Other Ambulatory Visit: Payer: Self-pay | Admitting: Family Medicine

## 2011-05-27 ENCOUNTER — Telehealth: Payer: Self-pay | Admitting: Family Medicine

## 2011-05-27 MED ORDER — DIVALPROEX SODIUM 250 MG PO DR TAB: 500.0000 mg | DELAYED_RELEASE_TABLET | Freq: Two times a day (BID) | ORAL | Status: DC

## 2011-05-27 NOTE — Telephone Encounter (Signed)
Received consult note from neurology from 05/08/11 appt.  They stopped pt's topamax.  Pt did not tell me this at her 05/16/11 appointment with me so I did not take this off her medication list.  I called pt today to make sure she is aware that neurology wanted her to stop the topamax.  No answer.  Asked pt to call me back at the office so we can review med list together.

## 2011-06-04 ENCOUNTER — Telehealth: Payer: Self-pay | Admitting: *Deleted

## 2011-06-04 ENCOUNTER — Telehealth: Payer: Self-pay | Admitting: Family Medicine

## 2011-06-04 NOTE — Telephone Encounter (Signed)
Needs handicap sticker 

## 2011-06-04 NOTE — Telephone Encounter (Signed)
Pt can bring the paperwork for the handicap sticker and submit it to the front office staff  to be placed in my box for review.  This should follow the same protocol as a normal routine paperwork request.

## 2011-06-04 NOTE — Telephone Encounter (Signed)
Called pt. Re: handicap sticker. Pt reports, that Dr.Caviness had given her temp. Handicap placard. It has ran out, needs another one. I told the pt, that I would ask Dr.Caviness .Arlyss Repress

## 2011-06-13 ENCOUNTER — Ambulatory Visit: Payer: Self-pay | Admitting: Physician Assistant

## 2011-06-19 ENCOUNTER — Telehealth: Payer: Self-pay | Admitting: Family Medicine

## 2011-06-19 NOTE — Telephone Encounter (Signed)
Patient left form to be filled out for disability.  Please fax when completed.  Placed form in dr's box.

## 2011-06-28 NOTE — Telephone Encounter (Signed)
Form completed and placed in the administrative to do box.

## 2011-07-09 ENCOUNTER — Telehealth: Payer: Self-pay | Admitting: Family Medicine

## 2011-07-09 NOTE — Telephone Encounter (Signed)
Patient has the [facility] card now and would like a referral to [facility] for Neurology.

## 2011-07-09 NOTE — Telephone Encounter (Signed)
Form dropped off to be filled out for disability.  Please fax when completed. °

## 2011-07-10 NOTE — Telephone Encounter (Signed)
Please call pt and let her know that we can discuss the referral to neurology and her concerns at her next follow up appointment.

## 2011-07-12 ENCOUNTER — Ambulatory Visit: Payer: Self-pay | Admitting: Family Medicine

## 2011-07-16 ENCOUNTER — Ambulatory Visit (INDEPENDENT_AMBULATORY_CARE_PROVIDER_SITE_OTHER): Payer: Self-pay | Admitting: Family Medicine

## 2011-07-16 ENCOUNTER — Encounter: Payer: Self-pay | Admitting: Family Medicine

## 2011-07-16 ENCOUNTER — Ambulatory Visit: Payer: Self-pay | Admitting: Family Medicine

## 2011-07-16 DIAGNOSIS — R1310 Dysphagia, unspecified: Secondary | ICD-10-CM

## 2011-07-16 DIAGNOSIS — F329 Major depressive disorder, single episode, unspecified: Secondary | ICD-10-CM

## 2011-07-16 DIAGNOSIS — G43909 Migraine, unspecified, not intractable, without status migrainosus: Secondary | ICD-10-CM

## 2011-07-16 LAB — COMPREHENSIVE METABOLIC PANEL
ALT: 19 U/L (ref 0–35)
AST: 17 U/L (ref 0–37)
Albumin: 4.5 g/dL (ref 3.5–5.2)
Alkaline Phosphatase: 69 U/L (ref 39–117)
Calcium: 10.2 mg/dL (ref 8.4–10.5)
Chloride: 103 mEq/L (ref 96–112)
Potassium: 4.3 mEq/L (ref 3.5–5.3)
Sodium: 138 mEq/L (ref 135–145)

## 2011-07-16 LAB — CBC
Platelets: 395 10*3/uL (ref 150–400)
RDW: 15 % (ref 11.5–15.5)
WBC: 6.5 10*3/uL (ref 4.0–10.5)

## 2011-07-16 MED ORDER — SERTRALINE HCL 100 MG PO TABS: 100.0000 mg | ORAL_TABLET | Freq: Every day | ORAL | Status: DC

## 2011-07-16 MED ORDER — PANTOPRAZOLE SODIUM 40 MG PO TBEC: 40.0000 mg | DELAYED_RELEASE_TABLET | Freq: Two times a day (BID) | ORAL | Status: DC

## 2011-07-16 NOTE — Progress Notes (Signed)
  Subjective:    Patient ID: Megan Kemp, female    DOB: 1961-01-13, 50 y.o.   MRN: [id number]  HPI Depression: Still tearful, especially surrounding thoughts about mother, "nobody wants to help me",  Pt endorses little interest or pleasure, + feelings of depression,  + problems with sleep, decreased appetite, feelings of guilt, problems with concentration, occasional thoughts of suicide but states that she would never do it b/c of her grandkids.  Has in the past had a plan of "shooting myself"  But lately hasn't had any of those thoughts.  Pt states she is safe.  She states that if she were to feel unsafe she would go see friend or go to the ER.    SH: got food stamps yesterday, attending women's support group, still seeing [facility], no involved in church. Denies tobacco, drugs, or alcohol.   Migraines: Still having approx 3 x per day.  On depakote- for migraines.  Managed per neuro.    Swallowing problems: Pt states that she still feels like food get's stuck in her throat in upper chest area.  States that she can only eat certain soft foods.  Unable to eat much meat b/c it seems to get stuck.  No n/v.  No blood in stool.  Unable to afford to see GI here in  2/2 lack of insurance.    Review of Systems As per above    Objective:   Physical Exam  Constitutional: She is oriented to person, place, and time. She appears well-developed and well-nourished.  HENT:  Head: Normocephalic and atraumatic.  Eyes: Pupils are equal, round, and reactive to light.  Neck: Normal range of motion.  Cardiovascular: Normal rate.  Exam reveals no gallop and no friction rub.   Murmur heard. Pulmonary/Chest: Effort normal and breath sounds normal. No respiratory distress. She has no wheezes.  Abdominal: Soft. She exhibits no distension. There is no tenderness. There is no rebound and no guarding.  Musculoskeletal: She exhibits no edema.  Neurological: She is alert and oriented to  person, place, and time. Coordination normal.       Ambulating with walker, gait even.  Skin: Skin is warm. No rash noted.  Psychiatric:       Pt tearful, PHQ-9 score=24          Assessment & Plan:

## 2011-07-16 NOTE — Patient Instructions (Signed)
All forms are complete- I will work on other form request today and fax Take zoloft 100mg  daily. If no improvement in depression with increase in zoloft and therapy will consider referral to psychiatrist.  Take protonix as directed. I will place a referral for swallow eval at [facility]- my staff will call you with the appointment. Go to neurology appointment ask for their recommendation of who will be best at [facility] to care for you and your migraines.   I will mail you your lab results.

## 2011-07-17 ENCOUNTER — Encounter: Payer: Self-pay | Admitting: Family Medicine

## 2011-07-17 ENCOUNTER — Other Ambulatory Visit: Payer: Self-pay | Admitting: Family Medicine

## 2011-07-19 DIAGNOSIS — N938 Other specified abnormal uterine and vaginal bleeding: Secondary | ICD-10-CM | POA: Insufficient documentation

## 2011-07-19 NOTE — Assessment & Plan Note (Signed)
Severe depression. Increased zoloft to 100mg .  Pt to return in 2-3 weeks.

## 2011-07-19 NOTE — Assessment & Plan Note (Signed)
Will draw lab work today to ensure wnl.  Pt to f/up with [facility] in 8/12.

## 2011-07-19 NOTE — Assessment & Plan Note (Signed)
Continues to have symptoms of dysphagia.  Will refer to [facility].  Pt to apply for pt assistance there.  Will first get a barium swallow test

## 2011-07-22 ENCOUNTER — Telehealth: Payer: Self-pay | Admitting: Family Medicine

## 2011-07-22 NOTE — Telephone Encounter (Signed)
Was given the number for a digestive doctor at [facility] that she would like her referral to go to.  The number is [phone].

## 2011-07-24 ENCOUNTER — Telehealth: Payer: Self-pay | Admitting: *Deleted

## 2011-07-24 NOTE — Telephone Encounter (Signed)
Message copied by Jennette Bill on Wed Jul 24, 2011  5:14 PM ------      Message from: [place], [place] M      Created: Fri Jul 19, 2011  7:21 PM       I placed an order for a barium swallow for this pt.  Can you please scheduled and call pt with appt?  Thanks!  Dawn

## 2011-07-24 NOTE — Telephone Encounter (Signed)
Left message with [facility] to return call to set up referral.Cellie Dardis, Rodena Medin

## 2011-07-24 NOTE — Telephone Encounter (Signed)
Left message for patient to return call. Please tell her she has appointment at [facility] for modified barrium swallow on 07/31/11 at 11 am.Sayana Salley, Rodena Medin

## 2011-07-24 NOTE — Telephone Encounter (Signed)
Faxed referral request to [facility], waiting for appointment.Busick, Rodena Medin

## 2011-07-25 ENCOUNTER — Other Ambulatory Visit (HOSPITAL_COMMUNITY): Payer: Self-pay | Admitting: Family Medicine

## 2011-07-26 ENCOUNTER — Ambulatory Visit (INDEPENDENT_AMBULATORY_CARE_PROVIDER_SITE_OTHER): Payer: Self-pay | Admitting: Physician Assistant

## 2011-07-26 ENCOUNTER — Encounter: Payer: Self-pay | Admitting: Physician Assistant

## 2011-07-26 VITALS — BP 117/76 | HR 95 | Temp 97.6°F | Ht 68.0 in | Wt 170.8 lb

## 2011-07-26 DIAGNOSIS — N949 Unspecified condition associated with female genital organs and menstrual cycle: Secondary | ICD-10-CM

## 2011-07-26 DIAGNOSIS — N938 Other specified abnormal uterine and vaginal bleeding: Secondary | ICD-10-CM

## 2011-07-26 DIAGNOSIS — Z30430 Encounter for insertion of intrauterine contraceptive device: Secondary | ICD-10-CM

## 2011-07-26 MED ORDER — IBUPROFEN 200 MG PO TABS: 800.0000 mg | ORAL_TABLET | Freq: Once | ORAL | Status: DC

## 2011-07-26 NOTE — Progress Notes (Signed)
IUD Insertion Procedure Note  Pre-operative Diagnosis: Dysfunctional Uterine Bleeding  Post-operative Diagnosis: same  Indications: contraception, Dysfunctional Uterine Bleeding  Procedure Details  Urine pregnancy test was done today and result was Negative.  The risks (including infection, bleeding, pain, and uterine perforation) and benefits of the procedure were explained to the patient and Written informed consent was obtained.    Cervix cleansed with Betadine. Uterus sounded to 7 cm. IUD inserted without difficulty. String visible and trimmed. Patient tolerated procedure well.  IUD Information: Mirena.  Condition: Stable  Complications: None  Plan:  The patient was advised to call for any fever or for prolonged or severe pain or bleeding. She was advised to use NSAID as needed for mild to moderate pain.  Follow up in 6 weeks for string check.

## 2011-07-31 ENCOUNTER — Ambulatory Visit (HOSPITAL_COMMUNITY)
Admission: RE | Admit: 2011-07-31 | Discharge: 2011-07-31 | Disposition: A | Discharge status: Home / Self Care | Payer: Self-pay | Source: Ambulatory Visit | Attending: Family Medicine | Admitting: Family Medicine

## 2011-07-31 DIAGNOSIS — R1310 Dysphagia, unspecified: Secondary | ICD-10-CM | POA: Insufficient documentation

## 2011-08-02 NOTE — Telephone Encounter (Signed)
Patient never returned call and appointment has already passed. Not sure if she kept the appointment or not.Busick, Rodena Medin

## 2011-08-05 ENCOUNTER — Ambulatory Visit (INDEPENDENT_AMBULATORY_CARE_PROVIDER_SITE_OTHER): Payer: Self-pay | Admitting: Family Medicine

## 2011-08-05 DIAGNOSIS — R1310 Dysphagia, unspecified: Secondary | ICD-10-CM

## 2011-08-05 DIAGNOSIS — F329 Major depressive disorder, single episode, unspecified: Secondary | ICD-10-CM

## 2011-08-05 DIAGNOSIS — G43909 Migraine, unspecified, not intractable, without status migrainosus: Secondary | ICD-10-CM

## 2011-08-05 MED ORDER — FLUOXETINE HCL 20 MG PO CAPS: 20.0000 mg | ORAL_CAPSULE | Freq: Every day | ORAL | Status: DC

## 2011-08-05 NOTE — Patient Instructions (Signed)
Swallowing problem: I will review the speech therapy note and I will have the staff schedule you for any further imaging that is needed. Paperwork: When I complete this I will have the office staff call you for pickup. Depression: [facility].  Take medication daily.

## 2011-08-05 NOTE — Progress Notes (Signed)
  Subjective:    Patient ID: Megan Kemp, female    DOB: 1961/09/22, 50 y.o.   MRN: [id number]  HPI 1) dysphagia: Pt endorses continued difficulty with swallowing solid foods, especially meats.  Went for barium swallow eval and found to have normal oropharyngeal function.  Screen of esophagus revealed solid food stasis from distal to thoracic esophagus, as well as retrograde flow of barium liquid.  They recommneded full barium swallow to delineate stricture versus motility disorder.  Recommended pt eat only liquids and soft, moist foods.    2)depression:  no improvement in symptoms.  PHQ- 27.  No hallucinations currently.  + suicidal thoughts daily.  No plan currently.  States that she feels safe and has safety plan in the case that she doesn't.  Only taking zoloft 3 x week.  Doesn't really want to take this b/c she "doesn't want to get hooked".  Also this medication is expensive.  Also would like referral to see a psychiatrist.  3) requesting that paperwork b/c completed for disability and a form to be completed to help with medication assistance.     Review of Systems    pt endorses weight loss.  Decreased appetite.  No diarrhea.  Occasional regurgitation of food.  No fever.  No abd pain.  Objective:   Physical Exam  Constitutional: She is oriented to person, place, and time. She appears well-developed and well-nourished.  HENT:  Head: Normocephalic and atraumatic.  Cardiovascular: Normal rate and regular rhythm.   Murmur heard. Pulmonary/Chest: Effort normal and breath sounds normal. No respiratory distress.  Abdominal: Soft. She exhibits no distension. There is no tenderness.  Musculoskeletal: She exhibits no edema.       Used walker to ambulate to room, able to get from chair onto exam table without walker.  Neurological: She is alert and oriented to person, place, and time.  Skin: No rash noted.  Psychiatric:       As per hpi          Assessment & Plan:

## 2011-08-06 ENCOUNTER — Encounter: Payer: Self-pay | Admitting: Family Medicine

## 2011-08-06 NOTE — Assessment & Plan Note (Signed)
PHQ-9 of 27 on 8/13- no improvement in symptoms.  No compliant with medication but is seeking therapist consistently.  Changed zoloft and placed on prozac since this is available through [facility].  Explained the importance of taking daily.  Pt states understanding. Will place referral for pt to see psychiatry at [facility] due to persistent depression, lack of improvement, and since pt has requested referral.

## 2011-08-06 NOTE — Assessment & Plan Note (Addendum)
On 07/31/11- went for barium swallow since continued symptoms Barium swallow results: normal oropharyngeal function.  Screen of esophagus revealed solid food stasis from distal to thoracic esophagus, as well as retrograde flow of barium liquid.  They recommneded full barium swallow to delineate stricture versus motility disorder.  Recommended pt eat only liquids and soft, moist foods.  0n 08/06/11- full barium swallow ordered per recommendation.   Pt has GI appt at [facility] with Dr. Aldona Lento on 10/21/11.

## 2011-08-12 ENCOUNTER — Encounter: Payer: Self-pay | Admitting: Family Medicine

## 2011-08-12 ENCOUNTER — Telehealth: Payer: Self-pay | Admitting: *Deleted

## 2011-08-12 NOTE — Telephone Encounter (Signed)
Called pt. Waiting for call back: Please tell pt appt for [place] of Esophagus is at [facility] 08-15-11 at 9:30 am. Arrive at 9:15 am at Radiology. DO NOT EAT OR DRINK 3 HRS BEFORE APPT. Lorenda Hatchet, [name]

## 2011-08-13 NOTE — Telephone Encounter (Signed)
Called pt again and confirmed her appt. Pt will keep appt and understands instructions. Megan Kemp, [name]

## 2011-08-15 ENCOUNTER — Ambulatory Visit (HOSPITAL_COMMUNITY)
Admission: RE | Admit: 2011-08-15 | Discharge: 2011-08-15 | Disposition: A | Discharge status: Home / Self Care | Payer: Self-pay | Source: Ambulatory Visit | Attending: Family Medicine | Admitting: Family Medicine

## 2011-08-15 DIAGNOSIS — R109 Unspecified abdominal pain: Secondary | ICD-10-CM | POA: Insufficient documentation

## 2011-08-15 DIAGNOSIS — R1310 Dysphagia, unspecified: Secondary | ICD-10-CM | POA: Insufficient documentation

## 2011-08-19 ENCOUNTER — Telehealth: Payer: Self-pay | Admitting: Family Medicine

## 2011-08-19 ENCOUNTER — Encounter: Payer: Self-pay | Admitting: Family Medicine

## 2011-08-19 NOTE — Telephone Encounter (Signed)
Called pt to discuss results of esophageal swallow evaluation.  Pt states understanding.  Pt to f/up with GI specialist at [facility] in 09/2011.

## 2011-08-27 ENCOUNTER — Encounter: Payer: Self-pay | Admitting: Family Medicine

## 2011-08-27 NOTE — Telephone Encounter (Signed)
Error

## 2011-08-27 NOTE — Telephone Encounter (Signed)
This encounter was created in error - please disregard.

## 2011-09-02 ENCOUNTER — Other Ambulatory Visit: Payer: Self-pay | Admitting: Family Medicine

## 2011-09-02 ENCOUNTER — Encounter: Payer: Self-pay | Admitting: Family Medicine

## 2011-09-02 MED ORDER — FLUOXETINE HCL 20 MG PO CAPS: 20.0000 mg | ORAL_CAPSULE | Freq: Every day | ORAL | Status: DC

## 2011-09-02 MED ORDER — PANTOPRAZOLE SODIUM 40 MG PO TBEC: 40.0000 mg | DELAYED_RELEASE_TABLET | Freq: Every day | ORAL | Status: DC

## 2011-09-02 MED ORDER — DIVALPROEX SODIUM 500 MG PO DR TAB: 500.0000 mg | DELAYED_RELEASE_TABLET | Freq: Two times a day (BID) | ORAL | Status: DC

## 2011-09-04 ENCOUNTER — Encounter: Payer: Self-pay | Admitting: Family Medicine

## 2011-09-04 ENCOUNTER — Ambulatory Visit (INDEPENDENT_AMBULATORY_CARE_PROVIDER_SITE_OTHER): Payer: Self-pay | Admitting: Family Medicine

## 2011-09-04 DIAGNOSIS — G43909 Migraine, unspecified, not intractable, without status migrainosus: Secondary | ICD-10-CM

## 2011-09-04 DIAGNOSIS — R1310 Dysphagia, unspecified: Secondary | ICD-10-CM

## 2011-09-04 DIAGNOSIS — F329 Major depressive disorder, single episode, unspecified: Secondary | ICD-10-CM

## 2011-09-04 DIAGNOSIS — R51 Headache: Secondary | ICD-10-CM

## 2011-09-04 NOTE — Patient Instructions (Signed)
Return in 2 month for recheck. Go to specialist appointments as scheduled. Call for psychiatry appointment at [facility].

## 2011-09-06 ENCOUNTER — Encounter: Payer: Self-pay | Admitting: Advanced Practice Midwife

## 2011-09-06 ENCOUNTER — Ambulatory Visit (INDEPENDENT_AMBULATORY_CARE_PROVIDER_SITE_OTHER): Payer: Self-pay | Admitting: Advanced Practice Midwife

## 2011-09-06 ENCOUNTER — Other Ambulatory Visit: Payer: Self-pay | Admitting: Advanced Practice Midwife

## 2011-09-06 VITALS — BP 112/80 | HR 79 | Temp 97.6°F | Ht 68.0 in | Wt 173.7 lb

## 2011-09-06 DIAGNOSIS — T839XXA Unspecified complication of genitourinary prosthetic device, implant and graft, initial encounter: Secondary | ICD-10-CM

## 2011-09-06 DIAGNOSIS — T8389XA Other specified complication of genitourinary prosthetic devices, implants and grafts, initial encounter: Secondary | ICD-10-CM

## 2011-09-06 DIAGNOSIS — N938 Other specified abnormal uterine and vaginal bleeding: Secondary | ICD-10-CM

## 2011-09-06 DIAGNOSIS — N949 Unspecified condition associated with female genital organs and menstrual cycle: Secondary | ICD-10-CM

## 2011-09-06 LAB — CBC WITH DIFFERENTIAL/PLATELET
Basophils Absolute: 0 10*3/uL (ref 0.0–0.1)
Eosinophils Absolute: 0.1 10*3/uL (ref 0.0–0.7)
Eosinophils Relative: 2 % (ref 0–5)
HCT: 38.7 % (ref 36.0–46.0)
Lymphs Abs: 1.8 10*3/uL (ref 0.7–4.0)
MCH: 29.1 pg (ref 26.0–34.0)
MCV: 90.2 fL (ref 78.0–100.0)
Monocytes Absolute: 0.4 10*3/uL (ref 0.1–1.0)
Platelets: 379 10*3/uL (ref 150–400)
RDW: 15.2 % (ref 11.5–15.5)

## 2011-09-06 MED ORDER — IBUPROFEN 600 MG PO TABS: 600.0000 mg | ORAL_TABLET | Freq: Four times a day (QID) | ORAL | Status: AC | PRN

## 2011-09-06 NOTE — Progress Notes (Signed)
  Subjective:    Patient ID: Megan Kemp, female    DOB: 05-27-1961, 50 y.o.   MRN: [id number]  HPI: Pt present to [facility] reporting continuous moderate cramping since insertion of Mirena on 07/26/11 for menorrhagia. She denies fever, chills, vaginal discharge or expulsion of the Mirena. She also denies IC for ~7 months and declines STD testing today.  Review of Systems  Constitutional: Negative.   Gastrointestinal: Positive for abdominal pain (surpspubic cramping). Negative for nausea, vomiting, diarrhea, constipation and abdominal distention.  Genitourinary: Positive for vaginal bleeding (small amount bleeding after insertion. Few episodes of scant spotting since) and pelvic pain. Negative for dysuria, frequency, flank pain, vaginal discharge, vaginal pain and menstrual problem.  Neurological: Negative for dizziness and weakness.  : Otherwise neg     Objective:   Physical Exam  Constitutional: She is oriented to person, place, and time. She appears well-developed and well-nourished. No distress.  Cardiovascular: Normal rate.   Pulmonary/Chest: Effort normal.  Abdominal: Soft. Bowel sounds are normal. She exhibits no distension. There is no tenderness. There is no guarding.  Genitourinary: Vagina normal and uterus normal. Uterus is not enlarged and not tender. Cervix exhibits no motion tenderness. Right adnexum displays no mass, no tenderness and no fullness. Left adnexum displays no mass, no tenderness and no fullness. No bleeding around the vagina. No vaginal discharge found.       Strings and Mirena not visualized or felt.   Neurological: She is alert and oriented to person, place, and time.  Skin: Skin is warm and dry. She is not diaphoretic.  Psychiatric: She has a normal mood and affect.   UPT neg  Assessment: 1. Persistent cramping 6 weeks after placement of Mirena, pain controlled w/ ibuprofen. No obvious evidence of infection, pregnancy or perforation. Strings not  vizualized. 2. Normal post-Mirena cramping vs complication  Plan: 1. Pelvic US to verify placement of IUD 2. CBC 3. Pt declines GC/CT cultures 4. Rx Ibuprofen 600 mg PO Q6 PRN 5. F/U in MAU PRN for heavy bleeding, increased pain, fever or chills.

## 2011-09-07 ENCOUNTER — Other Ambulatory Visit: Payer: Self-pay | Admitting: Family Medicine

## 2011-09-07 MED ORDER — PANTOPRAZOLE SODIUM 40 MG PO TBEC: 40.0000 mg | DELAYED_RELEASE_TABLET | Freq: Two times a day (BID) | ORAL | Status: DC

## 2011-09-07 NOTE — Assessment & Plan Note (Addendum)
Patient to followup with GI Dr. Teena Dunk on 10/29 at [facility]. Records esophageal and swallow studies given to patient to take to this appointment.

## 2011-09-07 NOTE — Progress Notes (Signed)
  Subjective:    Patient ID: Megan Kemp, female    DOB: February 20, 1961, 50 y.o.   MRN: [id number]  HPI Depression: Patient states depression improved on Prozac. Phq9-score of 8. States she is taking medications daily. Going to therapy weekly. Endorses that her focus is improved and able to do things such as reading more efficiently. Has less dark thoughts. Denies suicidal or homicidal ideation. Denies any problems with excessive energy. Denies problems with talkativeness or pressured speech. Denies rapid thoughts. States she does have problems with sleep, but when not sleeping is lying in bed not working on any projects. Patient does endorse some irritability and problems with anger. Also endorses decreased libido.  Dysphagia: Swallow and esophageal barium test within normal limits. Patient continues to have problems eating solids such as meats. Continues to eat only soft foods. No weight change. Has appointment scheduled with GI doctor in [facility] on 10/21/2011.  Migraine: Patient states that migraines occurred 2 times per week. Patient continues to take Depakote as directed by neurology. No changes in medication at last neurology appointment. Patient is going to transfer care to [facility] neurology, due to financial issues. Her for neurology aware of  transfer of care.     Review of Systems     no chest pain. No shortness of breath. As per history of present illness.  Objective:   Physical Exam  Constitutional: She is oriented to person, place, and time. She appears well-developed and well-nourished.  Cardiovascular: Normal rate and regular rhythm.   Murmur heard. Pulmonary/Chest: Effort normal and breath sounds normal. No respiratory distress.  Abdominal: Soft. She exhibits no distension. There is no tenderness. There is no rebound.  Musculoskeletal: She exhibits no edema.  Neurological: She is alert and oriented to person, place, and time. Coordination normal.       Ambulates with  walker, able to get onto exam table without problem.  Gait and stride even.  Skin: No rash noted.          Assessment & Plan:

## 2011-09-07 NOTE — Assessment & Plan Note (Signed)
Going to see migraine specialist/neurologist at [facility] (unable to afford visits at [facility] neurology currently).-- 11/04/11 at 0930. Continue Depakote.

## 2011-09-07 NOTE — Assessment & Plan Note (Signed)
PHQ-9 of 8 on 09/07/11- on prozac 20mg .  patient compliant with therapy. Although feels better does not want to titrate up at this time on dosage. Will consider further titration at next appointment.

## 2011-09-07 NOTE — Assessment & Plan Note (Addendum)
Continue Depakote 500 mg twice a day per neurology recommendations. Followup appointment with [facility] neurology in November.

## 2011-09-10 ENCOUNTER — Telehealth: Payer: Self-pay | Admitting: *Deleted

## 2011-09-10 ENCOUNTER — Ambulatory Visit (HOSPITAL_COMMUNITY)
Admission: RE | Admit: 2011-09-10 | Discharge: 2011-09-10 | Disposition: A | Discharge status: Home / Self Care | Payer: Self-pay | Source: Ambulatory Visit | Attending: Advanced Practice Midwife | Admitting: Advanced Practice Midwife

## 2011-09-10 DIAGNOSIS — N949 Unspecified condition associated with female genital organs and menstrual cycle: Secondary | ICD-10-CM | POA: Insufficient documentation

## 2011-09-10 DIAGNOSIS — T839XXA Unspecified complication of genitourinary prosthetic device, implant and graft, initial encounter: Secondary | ICD-10-CM

## 2011-09-10 DIAGNOSIS — D252 Subserosal leiomyoma of uterus: Secondary | ICD-10-CM | POA: Insufficient documentation

## 2011-09-10 DIAGNOSIS — Z30431 Encounter for routine checking of intrauterine contraceptive device: Secondary | ICD-10-CM | POA: Insufficient documentation

## 2011-09-10 DIAGNOSIS — D251 Intramural leiomyoma of uterus: Secondary | ICD-10-CM | POA: Insufficient documentation

## 2011-09-10 NOTE — Telephone Encounter (Signed)
Patient came to window with prescription for Ibuprofen 600 mg tablets written by Dorathy Kinsman, CNM and stated she took it to [facility] and they said they couldn't fill it, had to be 800mg .  Also she is a [facility]. Patient was informed we do not have any providers here today and we will route her request to a provider and if they approve it we will notify her.Original prescription for Ibuprofen 600 mg tabs was shredded.

## 2011-09-20 ENCOUNTER — Telehealth: Payer: Self-pay | Admitting: Family Medicine

## 2011-09-20 NOTE — Telephone Encounter (Signed)
Placed in Dr. Tobias Alexander box for completion.Megan Kemp

## 2011-09-20 NOTE — Telephone Encounter (Signed)
Pt need to have form completed by provider for disability

## 2011-09-23 ENCOUNTER — Other Ambulatory Visit: Payer: Self-pay | Admitting: Family Medicine

## 2011-09-23 ENCOUNTER — Other Ambulatory Visit: Payer: Self-pay | Admitting: Obstetrics and Gynecology

## 2011-09-23 ENCOUNTER — Telehealth: Payer: Self-pay | Admitting: *Deleted

## 2011-09-23 DIAGNOSIS — Z1231 Encounter for screening mammogram for malignant neoplasm of breast: Secondary | ICD-10-CM

## 2011-09-23 NOTE — Telephone Encounter (Signed)
Patient left message on callback line stating that she needed a prescription for Ibuprofen 800 mg. She saw [place] in clinic a few weeks ago and hasn't gotten her medicine yet.

## 2011-09-24 ENCOUNTER — Encounter: Payer: Self-pay | Admitting: *Deleted

## 2011-09-24 ENCOUNTER — Telehealth: Payer: Self-pay | Admitting: Family Medicine

## 2011-09-24 MED ORDER — IBUPROFEN 800 MG PO TABS: 800.0000 mg | ORAL_TABLET | Freq: Three times a day (TID) | ORAL | Status: DC | PRN

## 2011-09-24 NOTE — Telephone Encounter (Signed)
Received form back this morning from Dr. Edmonia James. Called patient, informed her of this. She requested that original be faxed, mailed, and then copy sent to her

## 2011-09-24 NOTE — Telephone Encounter (Signed)
Pt is asking for Dr Edmonia James to call her today.  She is depressed and is needing to speak with her asap.

## 2011-09-24 NOTE — Telephone Encounter (Signed)
Form completed and return to evans, cma.

## 2011-09-24 NOTE — Telephone Encounter (Signed)
Addended by: Jill Side on: 09/24/2011 12:20 PM   Modules accepted: Orders

## 2011-09-24 NOTE — Telephone Encounter (Signed)
Pt states she was feeling "down" earlier today but is feeling much better.  Family members have been giving her support.  No SI. No HI.  Seeing therapist on Friday. Appointment with Vesta Mixer on Monday for psychiatric evaluation.  Reviewed safety plan for if she does feel unsafe-- going to ER. Pt to call if any concerns or new or worsening of symptoms.

## 2011-09-24 NOTE — Telephone Encounter (Addendum)
Called pt - left message that I have called in her Rx for ibuprofen to the [facility] pharmacy. She may call back if she has questions

## 2011-09-24 NOTE — Telephone Encounter (Signed)
Dr. Edmonia James, Have you completed this form? ----Megan Kemp

## 2011-09-30 ENCOUNTER — Ambulatory Visit: Payer: Self-pay | Admitting: Family Medicine

## 2011-10-02 ENCOUNTER — Ambulatory Visit: Payer: Self-pay | Admitting: Family Medicine

## 2011-10-09 ENCOUNTER — Encounter: Payer: Self-pay | Admitting: Family Medicine

## 2011-10-09 ENCOUNTER — Ambulatory Visit (INDEPENDENT_AMBULATORY_CARE_PROVIDER_SITE_OTHER): Payer: Self-pay | Admitting: Family Medicine

## 2011-10-09 VITALS — BP 103/67 | HR 80 | Temp 98.2°F | Ht 68.0 in | Wt 174.0 lb

## 2011-10-09 DIAGNOSIS — F329 Major depressive disorder, single episode, unspecified: Secondary | ICD-10-CM

## 2011-10-09 DIAGNOSIS — Z23 Encounter for immunization: Secondary | ICD-10-CM

## 2011-10-09 NOTE — Progress Notes (Signed)
  Subjective:    Patient ID: Megan Kemp, female    DOB: [birth year], 50 y.o.   MRN: [id number]  HPI Depression: Patient states she is taking increased dose of Prozac 40 mg daily x1 week. She decided to titrate up as I recommended her last appointment. PH Q- 9 score of 8. Continues to have promblems concentration. Decreased interest. Problems with sleep. Decreased energy. No problems with pressured speech. No problems with mania. No prior problems with grandiose thoughts. Has not had any visual hallucinations. I=She did have one auditory hallucination-thought someone said her boyfriend's name. Has not seen psychiatrist-loss phone number for [facility]. States she is feeling better overall. No SI. No HI.  Medical history: Patient has followup appointment with [facility] neurology for migraines on November 12 and has a followup appointment with the GI specialist for dysphasia on October 29.   Review of Systems    as per above. Objective:   Physical Exam  Constitutional: She is oriented to person, place, and time. She appears well-developed and well-nourished.  HENT:  Head: Normocephalic and atraumatic.  Cardiovascular: Normal rate, regular rhythm and normal heart sounds.   Pulmonary/Chest: Effort normal. No respiratory distress. She has no wheezes.  Abdominal: Soft. She exhibits no distension.  Neurological: She is alert and oriented to person, place, and time.  Skin: No rash noted.  Psychiatric: She has a normal mood and affect. Her behavior is normal. Judgment and thought content normal.       No SI or HI. PHQ-9 of 8.           Assessment & Plan:

## 2011-10-09 NOTE — Patient Instructions (Signed)
mutivitamin with Calcium 1200mg  and Vit D 800mg   Return to see me after your GI and [facility] appointments.  See if [facility] has a psychiatrist that can help with medication management for your depression.

## 2011-10-17 NOTE — Assessment & Plan Note (Signed)
Continue at prozac 40mg .  Return in 1 month for recheck.  Talk with [facility] to see if they have a psychiatrist there that can evaluate you and give their input.

## 2011-10-24 ENCOUNTER — Encounter: Payer: Self-pay | Admitting: Obstetrics & Gynecology

## 2011-10-24 ENCOUNTER — Ambulatory Visit (INDEPENDENT_AMBULATORY_CARE_PROVIDER_SITE_OTHER): Payer: Self-pay | Admitting: Obstetrics & Gynecology

## 2011-10-24 VITALS — BP 114/74 | HR 85 | Temp 98.6°F | Ht 68.0 in | Wt 177.6 lb

## 2011-10-24 DIAGNOSIS — Z30431 Encounter for routine checking of intrauterine contraceptive device: Secondary | ICD-10-CM

## 2011-10-24 NOTE — Progress Notes (Signed)
History:  50 y.o. G1P1001 here today for followup ultrasound results for IUD surveillance.  Mirena IUD placed on 07/26/11 for menorrhagia; IUD strings not visualized on follow up visit.  Pelvic ultrasound also did not show IUD, but reported that the endometrium was obscured by fibroids.  Denies any current bleeding or any other concerns.  09/10/11 TRANSABDOMINAL AND TRANSVAGINAL ULTRASOUND OF PELVIS  Uterus: 14.0 x 8.8 x 7.0 cm. Multiple fibroids noted as follows: Right lateral uterine fundus, 3.2 x 3.1 x 2.9 cm, predominately subserosal, minimally intramural. Anterior uterine body intramural fibroid with some posterior displacement upon the endometrial stripe, 4.2 x 4.1 x 3.9 cm Left lateral uterine body/lower uterine segment, intramural, 4.9 x 5.0 x 6.6 cm  Endometrium: Not visualized. IUD not identified.  Right ovary: 3.1 x 2.6 x 1.9 cm. Normal. Left ovary: 3.4 x 2.4 x 2.2 cm. Normal.  Other findings: No free fluid  IMPRESSION: Multiple fibroids as above. IUD not visualized, but the endometrium is obscured by overlying fibroids. Consider abdominal radiograph to determine whether the IUD is still present but  obscured sonographically.  The following portions of the patient's history were reviewed and updated as appropriate: allergies, current medications, past family history, past medical history, past social history, past surgical history and problem list.    Objective:  Physical Exam Blood pressure 114/74, pulse 85, temperature 98.6 F (37 C), temperature source Oral, height 5\' 8"  (1.727 m), weight 177 lb 9.6 oz (80.559 kg). Exam deferred   Assessment & Plan:  Will obtain abdominal and pelvic radiograph to evaluate for IUD.  If IUD is found in the pelvis; patient will need hysteroscopy +/- laparoscopy for further management.  If no IUD is seen, it is likely it was expelled.  This was discussed with the patient and she agrees with plan.  Will follow up results and manage accordingly.

## 2011-10-25 ENCOUNTER — Ambulatory Visit (HOSPITAL_COMMUNITY)
Admission: RE | Admit: 2011-10-25 | Discharge: 2011-10-25 | Disposition: A | Discharge status: Home / Self Care | Payer: Self-pay | Source: Ambulatory Visit | Attending: Obstetrics & Gynecology | Admitting: Obstetrics & Gynecology

## 2011-10-25 DIAGNOSIS — I998 Other disorder of circulatory system: Secondary | ICD-10-CM | POA: Insufficient documentation

## 2011-10-25 DIAGNOSIS — Z30431 Encounter for routine checking of intrauterine contraceptive device: Secondary | ICD-10-CM | POA: Insufficient documentation

## 2011-10-29 ENCOUNTER — Telehealth: Payer: Self-pay | Admitting: Family Medicine

## 2011-10-29 NOTE — Telephone Encounter (Signed)
went to [facility] and they are going to run light down her throat and a colonoscopy on Friday 11/16 She just wanted Dr Edmonia James to know.

## 2011-10-29 NOTE — Telephone Encounter (Signed)
Forward to Dr Edmonia James for review

## 2011-10-30 ENCOUNTER — Ambulatory Visit (HOSPITAL_COMMUNITY)
Admission: RE | Admit: 2011-10-30 | Discharge: 2011-10-30 | Disposition: A | Discharge status: Home / Self Care | Payer: Self-pay | Source: Ambulatory Visit | Attending: Obstetrics and Gynecology | Admitting: Obstetrics and Gynecology

## 2011-10-30 ENCOUNTER — Ambulatory Visit (INDEPENDENT_AMBULATORY_CARE_PROVIDER_SITE_OTHER): Payer: Self-pay | Admitting: Obstetrics & Gynecology

## 2011-10-30 ENCOUNTER — Encounter: Payer: Self-pay | Admitting: Obstetrics and Gynecology

## 2011-10-30 VITALS — BP 106/74 | HR 83 | Temp 98.1°F | Ht 68.0 in | Wt 179.5 lb

## 2011-10-30 DIAGNOSIS — Z30431 Encounter for routine checking of intrauterine contraceptive device: Secondary | ICD-10-CM

## 2011-10-30 DIAGNOSIS — Z1231 Encounter for screening mammogram for malignant neoplasm of breast: Secondary | ICD-10-CM | POA: Insufficient documentation

## 2011-10-30 DIAGNOSIS — T8389XA Other specified complication of genitourinary prosthetic devices, implants and grafts, initial encounter: Secondary | ICD-10-CM

## 2011-10-30 DIAGNOSIS — Z01818 Encounter for other preprocedural examination: Secondary | ICD-10-CM

## 2011-10-30 DIAGNOSIS — T8332XA Displacement of intrauterine contraceptive device, initial encounter: Secondary | ICD-10-CM | POA: Insufficient documentation

## 2011-10-30 NOTE — Patient Instructions (Addendum)
Surgery is scheduled for Monday, November 19, at 1 pm.  Arrive at 11:30 am, nothing to eat or drink after midnight before night.  Laparoscopy Laparoscopy is a surgical procedure. It is used to diagnose and treat diseases inside the belly(abdomen). It is usually a brief, common, and relatively simple procedure. The laparoscope is a thin, lighted, pencil-sized instrument. It is like a telescope. It is inserted into your abdomen through a small cut (incision). Your caregiver can look at the organs inside your body through this instrument. He or she can see if there is anything abnormal, and take out the IUD that has been misplaced. Laparoscopy will be done either in an operating room. You may be given a mild sedative to help you relax before the procedure. Once in the operating room, you will be given a drug to make you sleep (general anesthesia). Laparoscopy usually lasts less than 1 hour. After the procedure, you will be monitored in a recovery area until you are stable and doing well. Once you are home, it will take 2 to 3 days to fully recover. RISKS AND COMPLICATIONS  Laparoscopy has relatively few risks. Your caregiver will discuss the risks with you before the procedure. Some problems that can occur include:  Infection.   Bleeding.   Damage to other organs.   Anesthetic side effects.  PROCEDURE Once you receive anesthesia, your surgeon inflates the abdomen with a harmless gas (carbon dioxide). This makes the organs easier to see. The laparoscope is inserted into the abdomen through a small incision. This allows your surgeon to see into the abdomen. Other small instruments are also inserted into the abdomen through other small openings. Many surgeons attach a video camera to the laparoscope to enlarge the view. During a diagnostic laparoscopy, the surgeon may be looking for inflammation, infection, or cancer. Your surgeon may take tissue samples(biopsies). The samples are sent to a specialist in  looking at cells and tissue samples (pathologist). The pathologist examines them under a microscope. Biopsies can help to diagnose or confirm a disease. AFTER THE PROCEDURE   The gas is released from inside the abdomen.   The incisions are closed with stitches (sutures). Because these incisions are small (usually less than 1/2 inch), there is usually minimal discomfort after the procedure. There may be some mild discomfort in the throat. This is from the tube placed in the throat while you were sleeping. You may have some mild abdominal discomfort. There may also be discomfort from the instrument placement incisions in the abdomen.   The recovery time is shortened as long as there are no complications.   You will rest in a recovery room until stable and doing well. As long as there are no complications, you may be allowed to go home.  FINDING OUT THE RESULTS OF YOUR TEST Not all test results are available during your visit. If your test results are not back during the visit, make an appointment with your caregiver to find out the results. Do not assume everything is normal if you have not heard from your caregiver or the medical facility. It is important for you to follow up on all of your test results. HOME CARE INSTRUCTIONS   Take all medicines as directed.   Only take over-the-counter or prescription medicines for pain, discomfort, or fever as directed by your caregiver.   Resume daily activities as directed.   Showers are preferred over baths.   You may resume sexual activities in 1 week or as directed.  Do not drive while taking narcotics.  SEEK MEDICAL CARE IF:   There is increasing abdominal pain.   There is new pain in the shoulders (shoulder strap areas).   You feel lightheaded or faint.   You have the chills.   You or your child has an oral temperature above 102 F (38.9 C).   There is pus-like (purulent) drainage from any of the wounds.   You are unable to pass gas  or have a bowel movement.   You feel sick to your stomach (nauseous) or throw up (vomit).  MAKE SURE YOU:   Understand these instructions.   Will watch your condition.   Will get help right away if you are not doing well or get worse.  Document Released: 03/17/2001 Document Revised: 08/21/2011 Document Reviewed: 12/09/2007 [facility] Patient Information 2012 [place], [place].  Endometrial Ablation Endometrial ablation removes the lining of the uterus (endometrium). It is usually a same day, outpatient treatment. Ablation helps avoid major surgery (such as a hysterectomy). A hysterectomy is removal of the cervix and uterus. Endometrial ablation has less risk and complications, has a shorter recovery period and is less expensive. After endometrial ablation, most women will have little or no menstrual bleeding. You may not keep your fertility. Pregnancy is no longer likely after this procedure but if you are pre-menopausal, you still need to use a reliable method of birth control following the procedure because pregnancy can occur. REASONS TO HAVE THE PROCEDURE MAY INCLUDE:  Heavy periods.   Bleeding that is causing anemia.   Anovulatory bleeding, very irregular, bleeding.   Bleeding submucous fibroids (on the lining inside the uterus) if they are smaller than 3 centimeters.  REASONS NOT TO HAVE THE PROCEDURE MAY INCLUDE:  You wish to have more children.   You have a pre-cancerous or cancerous problem. The cause of any abnormal bleeding must be diagnosed before having the procedure.   You have pain coming from the uterus.   You have a submucus fibroid larger than 3 centimeters.   You recently had a baby.   You recently had an infection in the uterus.   You have a severe retro-flexed, tipped uterus and cannot insert the instrument to do the ablation.   You had a Cesarean section or deep major surgery on the uterus.   The inner cavity of the uterus is too large for the endometrial  ablation instrument.  RISKS AND COMPLICATIONS   Perforation of the uterus.   Bleeding.   Infection of the uterus, bladder or vagina.   Injury to surrounding organs.   Cutting the cervix.   An air bubble to the lung (air embolus).   Pregnancy following the procedure.   Failure of the procedure to help the problem requiring hysterectomy.   Decreased ability to diagnose cancer in the lining of the uterus.  BEFORE THE PROCEDURE  The lining of the uterus must be tested to make sure there is no pre-cancerous or cancer cells present.   Medications may be given to make the lining of the uterus thinner.   Ultrasound may be used to evaluate the size and look for abnormalities of the uterus.   Future pregnancy is not desired.  PROCEDURE  There are different ways to destroy the lining of the uterus.   Resectoscope - radio frequency-alternating electric current is the most common one used.   Cryotherapy - freezing the lining of the uterus.   Heated Free Liquid - heated salt (saline) solution inserted into the  uterus.   Microwave - uses high energy microwaves in the uterus.   Thermal Balloon - a catheter with a balloon tip is inserted into the uterus and filled with heated fluid.  Your caregiver will talk with you about the method used in this clinic. They will also instruct you on the pros and cons of the procedure. Endometrial ablation is performed along with a procedure called operative hysteroscopy. A narrow viewing tube is inserted through the birth canal (vagina) and through the cervix into the uterus. A tiny camera attached to the viewing tube (hysteroscope) allows the uterine cavity to be shown on a TV monitor during surgery. Your uterus is filled with a harmless liquid to make the procedure easier. The lining of the uterus is then removed. The lining can also be removed with a resectoscope which allows your surgeon to cut away the lining of the uterus under direct vision. Usually,  you will be able to go home within an hour after the procedure. HOME CARE INSTRUCTIONS   Do not drive for 24 hours.   No tampons, douching or intercourse for 2 weeks or until your caregiver approves.   Rest at home for 24 to 48 hours. You may then resume normal activities unless told differently by your caregiver.   Take your temperature two times a day for 4 days, and record it.   Take any medications your caregiver has ordered, as directed.   Use some form of contraception if you are pre-menopausal and do not want to get pregnant.  Bleeding after the procedure is normal. It varies from light spotting and mildly watery to bloody discharge for 4 to 6 weeks. You may also have mild cramping. Only take over-the-counter or prescription medicines for pain, discomfort, or fever as directed by your caregiver. Do not use aspirin, as this may aggravate bleeding. Frequent urination during the first 24 hours is normal. You will not know how effective your surgery is until at least 3 months after the surgery. SEEK IMMEDIATE MEDICAL CARE IF:   Bleeding is heavier than a normal menstrual cycle.   An oral temperature above 102 F (38.9 C) develops.   You have increasing cramps or pains not relieved with medication or develop belly (abdominal) pain which does not seem to be related to the same area of earlier cramping and pain.   You are light headed, weak or have fainting episodes.   You develop pain in the shoulder strap areas.   You have chest or leg pain.   You have abnormal vaginal discharge.   You have painful urination.  Document Released: 10/18/2004 Document Revised: 08/21/2011 Document Reviewed: 01/16/2008 [facility] Patient Information 2012 [place], [place].

## 2011-10-30 NOTE — Progress Notes (Signed)
Subjective:     Patient ID: Megan Kemp, female   DOB: [birth year], 50 y.o.   MRN: [id number]  HPI 50 y.o. G1P1001 here today for followup ultrasound results for IUD surveillance. Mirena IUD placed on 07/26/11 for menorrhagia; IUD strings not visualized on follow up visit. Pelvic ultrasound also did not show IUD, but reported that the endometrium was obscured by fibroids.  Abdominal and pelvic radiographs were ordered, patient is here today to follow up results.  Denies any current bleeding or any other concerns.  Patient had a benign endometrial biopsy.  Dg Pelvis 1-2 Views 10/25/2011  *RADIOLOGY REPORT*  Clinical Data: Misplaced IUD  PELVIS - 1-2 VIEW  Comparison: Pelvic ultrasound - 09/10/2011  Findings: Intrauterine device overlies the right hemi pelvis. Multiple phleboliths are identified within the pelvis.  Minimal avulsive change about the bilateral iliac crests.  No fracture. Regional soft tissues are normal.  IMPRESSION: An IUD overlies the right hemipelvis, however exact positioning is indeterminate on this examination.  Further evaluation with direct visualization may be performed as clinically indicated.   Dg Abd 2 Views 10/25/2011  *RADIOLOGY REPORT*  Clinical Data: Abdominal plane, misplaced IUD  ABDOMEN - 2 VIEW  Comparison: Pelvic radiographs - earlier same day; pelvic ultrasound - 09/10/2011  Findings: An IUD overlies the right hemi pelvis.  Multiple phleboliths are identified within the pelvis.  Nonobstructive bowel gas pattern.  No acute osseous abnormalities.  IMPRESSION: An IUD overlies the right hemipelvis, however exact positioning is indeterminate on this examination.  Further evaluation with direct visualization may be performed as clinically indicated.  09/10/11 TRANSABDOMINAL AND TRANSVAGINAL ULTRASOUND OF PELVIS  Uterus: 14.0 x 8.8 x 7.0 cm. Multiple fibroids noted as follows: Right lateral uterine fundus, 3.2 x 3.1 x 2.9 cm, predominately subserosal, minimally intramural.  Anterior uterine body intramural fibroid with some posterior displacement upon the endometrial stripe, 4.2 x 4.1 x 3.9 cm Left lateral uterine body/lower uterine segment, intramural, 4.9 x 5.0 x 6.6 cm  Endometrium: Not visualized. IUD not identified.  Right ovary: 3.1 x 2.6 x 1.9 cm. Normal. Left ovary: 3.4 x 2.4 x 2.2 cm. Normal.  Other findings: No free fluid  IMPRESSION: Multiple fibroids as above. IUD not visualized, but the endometrium is obscured by overlying fibroids. Consider abdominal radiograph to determine whether the IUD is still present but  obscured sonographically.   The following portions of the patient's history were reviewed and updated as appropriate: allergies, current medications, past family history, past medical history, past social history, past surgical history and problem list.  Review of Systems Negative    Objective:   Physical Exam  Constitutional: She is oriented to person, place, and time. Vital signs are normal. She appears well-developed.       Blood pressure 106/74, pulse 83, temperature 98.1 F (36.7 C), temperature source Oral, height 5\' 8"  (1.727 m), weight 179 lb 8 oz (81.421 kg).   Cardiovascular: Normal rate.   Pulmonary/Chest: Breath sounds normal.  Abdominal: Soft. There is tenderness.       Lower abdominal tenderness to palpation in left and right lower quadrants, no rebound or guarding.  No abnormal masses.  Genitourinary:       Deferred  Musculoskeletal: Normal range of motion.  Neurological: She is oriented to person, place, and time.      Assessment:    Patient with malpositioned Mirena IUD, placed to help with menorrhagia.    Plan:   Discussed findings with patient and emphasized that this is a  known complication of Mirena IUD placement. Given the position of the IUD on radiograph, there is concern about the IUD being involved with bowel. Patient was counseled that she will need surgery to locate and remove the malpositioned IUD.   Patient also wants surgical management for her menorrhagia. Recommended laparoscopy for the malpositioned IUD, and hysteroscopic hydrothermal ablation for her menorrhagia.  The risks of surgery were discussed in detail with the patient including but not limited to: bleeding which may require transfusion or reoperation; infection which may require prolonged hospitalization or re-hospitalization and antibiotic therapy; injury to bowel, bladder, ureters and major vessels or other surrounding organs; need for additional procedures including laparotomy; thromboembolic phenomenon, incisional problems and other postoperative or anesthesia complications. All questions were answered.  Surgery was scheduled on Monday November 11, 2011 at 1pm; routine preoperative instructions of having nothing to eat or drink after midnight on the day prior to surgery and also coming to the hospital 1 1/2 hours prior to her time of surgery were also emphasized.  She was told she may be called for a preoperative appointment about a week prior to surgery and will be given further preoperative instructions at that visit.  Patient education handout about the procedures was given to patient.  Appropriate support was given to the patient during this clinic encounter in light of this complication.  She was told to call the clinic or come to the [facility] for any preoperative or other gynecologic concerns.

## 2011-10-31 ENCOUNTER — Encounter (HOSPITAL_COMMUNITY): Payer: Self-pay | Admitting: Pharmacist

## 2011-10-31 ENCOUNTER — Ambulatory Visit (HOSPITAL_COMMUNITY): Payer: Self-pay

## 2011-11-04 ENCOUNTER — Telehealth: Payer: Self-pay | Admitting: Family Medicine

## 2011-11-04 NOTE — Telephone Encounter (Signed)
Done. .Megan Kemp  

## 2011-11-04 NOTE — Patient Instructions (Addendum)
   Your procedure is scheduled [id number] November 19th  Enter through the Main Entrance of [facility] at:11:30am Pick up the phone at the desk and dial [phone] and inform us of your arrival.  Please call this number if you have any problems the morning of surgery: [phone]  Remember: Do not eat food after midnight:Sunday Do not drink clear liquids after:Monday 9am Take these medicines the morning of surgery with a SIP OF WATER:morning medications-protonix, depakote, prozac  Do not wear jewelry, make-up, or FINGER nail polish Do not wear lotions, powders, or perfumes.  You may not  wear deodorant. Do not shave 48 hours prior to surgery. Do not bring valuables to the hospital.    Patients discharged on the day of surgery will not be allowed to drive home.     Remember to use your hibiclens as instructed.Please shower with 1/2 bottle the evening before your surgery and the other 1/2 bottle the morning of surgery.

## 2011-11-04 NOTE — Telephone Encounter (Signed)
Megan Kemp is at the neurologist office in [place] at [facility].  They are needing records for all imaging results on pt faxed to [phone]  Attn:  Dr. Lucianne Muss, neurologist.  They are wanting to sched new tests for her, but want results from previous ones.

## 2011-11-06 ENCOUNTER — Encounter (HOSPITAL_COMMUNITY): Payer: Self-pay

## 2011-11-06 ENCOUNTER — Encounter (HOSPITAL_COMMUNITY)
Admission: RE | Admit: 2011-11-06 | Discharge: 2011-11-06 | Disposition: A | Discharge status: Home / Self Care | Payer: Medicaid Other | Source: Ambulatory Visit | Attending: Obstetrics & Gynecology | Admitting: Obstetrics & Gynecology

## 2011-11-06 HISTORY — DX: Nausea with vomiting, unspecified: R11.2

## 2011-11-06 HISTORY — DX: Other specified postprocedural states: Z98.890

## 2011-11-06 LAB — CBC
MCH: 29.3 pg (ref 26.0–34.0)
MCHC: 32.2 g/dL (ref 30.0–36.0)
Platelets: 355 10*3/uL (ref 150–400)
RDW: 14.1 % (ref 11.5–15.5)

## 2011-11-06 LAB — SURGICAL PCR SCREEN: MRSA, PCR: NEGATIVE

## 2011-11-10 MED ORDER — DEXTROSE 5 % IV SOLN
1.0000 g | INTRAVENOUS | Status: DC
  Filled 2011-11-10: qty 1

## 2011-11-11 ENCOUNTER — Ambulatory Visit (HOSPITAL_COMMUNITY)
Admission: RE | Admit: 2011-11-11 | Discharge: 2011-11-11 | Disposition: A | Discharge status: Home / Self Care | Payer: Medicaid Other | Source: Ambulatory Visit | Attending: Obstetrics & Gynecology | Admitting: Obstetrics & Gynecology

## 2011-11-11 ENCOUNTER — Encounter (HOSPITAL_COMMUNITY): Payer: Self-pay | Admitting: Anesthesiology

## 2011-11-11 ENCOUNTER — Other Ambulatory Visit: Payer: Self-pay | Admitting: Obstetrics & Gynecology

## 2011-11-11 ENCOUNTER — Encounter (HOSPITAL_COMMUNITY): Payer: Self-pay | Admitting: *Deleted

## 2011-11-11 ENCOUNTER — Encounter (HOSPITAL_COMMUNITY)
Admission: RE | Disposition: A | Discharge status: Home / Self Care | Payer: Self-pay | Source: Ambulatory Visit | Attending: Obstetrics & Gynecology

## 2011-11-11 ENCOUNTER — Ambulatory Visit (HOSPITAL_COMMUNITY): Payer: Medicaid Other | Admitting: Anesthesiology

## 2011-11-11 DIAGNOSIS — N949 Unspecified condition associated with female genital organs and menstrual cycle: Secondary | ICD-10-CM | POA: Insufficient documentation

## 2011-11-11 DIAGNOSIS — T8339XA Other mechanical complication of intrauterine contraceptive device, initial encounter: Secondary | ICD-10-CM | POA: Insufficient documentation

## 2011-11-11 DIAGNOSIS — N803 Endometriosis of pelvic peritoneum, unspecified: Secondary | ICD-10-CM | POA: Insufficient documentation

## 2011-11-11 DIAGNOSIS — N920 Excessive and frequent menstruation with regular cycle: Secondary | ICD-10-CM | POA: Insufficient documentation

## 2011-11-11 DIAGNOSIS — D252 Subserosal leiomyoma of uterus: Secondary | ICD-10-CM | POA: Insufficient documentation

## 2011-11-11 DIAGNOSIS — T8332XA Displacement of intrauterine contraceptive device, initial encounter: Secondary | ICD-10-CM

## 2011-11-11 DIAGNOSIS — D259 Leiomyoma of uterus, unspecified: Secondary | ICD-10-CM

## 2011-11-11 HISTORY — PX: LAPAROSCOPY: SHX197

## 2011-11-11 SURGERY — LAPAROSCOPY OPERATIVE
Anesthesia: General

## 2011-11-11 MED ORDER — LACTATED RINGERS IV SOLN: INTRAVENOUS | Status: DC | PRN

## 2011-11-11 MED ORDER — GLYCOPYRROLATE 0.2 MG/ML IJ SOLN
INTRAMUSCULAR | Status: AC
  Filled 2011-11-11: qty 1

## 2011-11-11 MED ORDER — DOCUSATE SODIUM 100 MG PO CAPS: 100.0000 mg | ORAL_CAPSULE | Freq: Two times a day (BID) | ORAL | Status: AC | PRN

## 2011-11-11 MED ORDER — PANTOPRAZOLE SODIUM 40 MG PO TBEC
40.0000 mg | DELAYED_RELEASE_TABLET | Freq: Once | ORAL | Status: AC
  Administered 2011-11-11: 40 mg via ORAL

## 2011-11-11 MED ORDER — METOCLOPRAMIDE HCL 5 MG/ML IJ SOLN
10.0000 mg | Freq: Once | INTRAMUSCULAR | Status: AC
  Administered 2011-11-11: 10 mg via INTRAVENOUS

## 2011-11-11 MED ORDER — DEXAMETHASONE SODIUM PHOSPHATE 4 MG/ML IJ SOLN
INTRAMUSCULAR | Status: DC | PRN
  Administered 2011-11-11: 10 mg via INTRAVENOUS

## 2011-11-11 MED ORDER — SODIUM CHLORIDE 0.9 % IR SOLN
Status: DC | PRN
  Administered 2011-11-11: 1

## 2011-11-11 MED ORDER — NEOSTIGMINE METHYLSULFATE 1 MG/ML IJ SOLN
INTRAMUSCULAR | Status: DC | PRN
  Administered 2011-11-11: 3 mg via INTRAVENOUS

## 2011-11-11 MED ORDER — NEOSTIGMINE METHYLSULFATE 1 MG/ML IJ SOLN
INTRAMUSCULAR | Status: AC
  Filled 2011-11-11: qty 10

## 2011-11-11 MED ORDER — GLYCOPYRROLATE 0.2 MG/ML IJ SOLN
INTRAMUSCULAR | Status: DC | PRN
  Administered 2011-11-11: .6 mg via INTRAVENOUS
  Administered 2011-11-11: 0.2 mg via INTRAVENOUS

## 2011-11-11 MED ORDER — ONDANSETRON HCL 4 MG/2ML IJ SOLN
INTRAMUSCULAR | Status: AC
  Filled 2011-11-11: qty 2

## 2011-11-11 MED ORDER — IBUPROFEN 600 MG PO TABS: 600.0000 mg | ORAL_TABLET | Freq: Four times a day (QID) | ORAL | Status: AC | PRN

## 2011-11-11 MED ORDER — FENTANYL CITRATE 0.05 MG/ML IJ SOLN
INTRAMUSCULAR | Status: AC
  Filled 2011-11-11: qty 5

## 2011-11-11 MED ORDER — MIDAZOLAM HCL 5 MG/5ML IJ SOLN
INTRAMUSCULAR | Status: DC | PRN
  Administered 2011-11-11: 2 mg via INTRAVENOUS

## 2011-11-11 MED ORDER — LACTATED RINGERS IV SOLN
INTRAVENOUS | Status: DC
  Administered 2011-11-11 (×3): via INTRAVENOUS
  Administered 2011-11-11: 125 mL/h via INTRAVENOUS

## 2011-11-11 MED ORDER — EPHEDRINE SULFATE 50 MG/ML IJ SOLN
INTRAMUSCULAR | Status: AC
  Filled 2011-11-11: qty 1

## 2011-11-11 MED ORDER — ONDANSETRON HCL 4 MG/2ML IJ SOLN
INTRAMUSCULAR | Status: DC | PRN
  Administered 2011-11-11: 4 mg via INTRAVENOUS

## 2011-11-11 MED ORDER — SCOPOLAMINE 1 MG/3DAYS TD PT72
MEDICATED_PATCH | TRANSDERMAL | Status: AC
  Administered 2011-11-11: 1.5 mg
  Filled 2011-11-11: qty 1

## 2011-11-11 MED ORDER — FENTANYL CITRATE 0.05 MG/ML IJ SOLN
INTRAMUSCULAR | Status: DC | PRN
  Administered 2011-11-11 (×2): 50 ug via INTRAVENOUS
  Administered 2011-11-11: 12.5 ug via INTRAVENOUS
  Administered 2011-11-11: 50 ug via INTRAVENOUS

## 2011-11-11 MED ORDER — ROCURONIUM BROMIDE 100 MG/10ML IV SOLN
INTRAVENOUS | Status: DC | PRN
  Administered 2011-11-11: 40 mg via INTRAVENOUS
  Administered 2011-11-11: 10 mg via INTRAVENOUS

## 2011-11-11 MED ORDER — ROCURONIUM BROMIDE 50 MG/5ML IV SOLN
INTRAVENOUS | Status: AC
  Filled 2011-11-11: qty 1

## 2011-11-11 MED ORDER — PROPOFOL 10 MG/ML IV EMUL
INTRAVENOUS | Status: AC
  Filled 2011-11-11: qty 20

## 2011-11-11 MED ORDER — METOCLOPRAMIDE HCL 5 MG/ML IJ SOLN
INTRAMUSCULAR | Status: AC
  Filled 2011-11-11: qty 2

## 2011-11-11 MED ORDER — LIDOCAINE HCL (CARDIAC) 20 MG/ML IV SOLN
INTRAVENOUS | Status: AC
  Filled 2011-11-11: qty 5

## 2011-11-11 MED ORDER — KETOROLAC TROMETHAMINE 30 MG/ML IJ SOLN
15.0000 mg | Freq: Once | INTRAMUSCULAR | Status: AC | PRN
  Administered 2011-11-11: 30 mg via INTRAVENOUS

## 2011-11-11 MED ORDER — EPHEDRINE SULFATE 50 MG/ML IJ SOLN
INTRAMUSCULAR | Status: DC | PRN
  Administered 2011-11-11 (×3): 5 mg via INTRAVENOUS

## 2011-11-11 MED ORDER — PROPOFOL 10 MG/ML IV EMUL
INTRAVENOUS | Status: DC | PRN
  Administered 2011-11-11: 200 mg via INTRAVENOUS

## 2011-11-11 MED ORDER — LIDOCAINE HCL (CARDIAC) 20 MG/ML IV SOLN
INTRAVENOUS | Status: DC | PRN
  Administered 2011-11-11: 100 mg via INTRAVENOUS

## 2011-11-11 MED ORDER — BUPIVACAINE HCL (PF) 0.5 % IJ SOLN
INTRAMUSCULAR | Status: DC | PRN
  Administered 2011-11-11: 17 mL

## 2011-11-11 MED ORDER — OXYCODONE-ACETAMINOPHEN 5-325 MG PO TABS: 1.0000 | ORAL_TABLET | Freq: Four times a day (QID) | ORAL | Status: AC | PRN

## 2011-11-11 MED ORDER — PANTOPRAZOLE SODIUM 40 MG PO TBEC
DELAYED_RELEASE_TABLET | ORAL | Status: AC
  Filled 2011-11-11: qty 1

## 2011-11-11 MED ORDER — DEXAMETHASONE SODIUM PHOSPHATE 10 MG/ML IJ SOLN
INTRAMUSCULAR | Status: AC
  Filled 2011-11-11: qty 1

## 2011-11-11 MED ORDER — MIDAZOLAM HCL 2 MG/2ML IJ SOLN
INTRAMUSCULAR | Status: AC
  Filled 2011-11-11: qty 2

## 2011-11-11 MED ORDER — KETOROLAC TROMETHAMINE 30 MG/ML IJ SOLN
INTRAMUSCULAR | Status: AC
  Administered 2011-11-11: 30 mg via INTRAVENOUS
  Filled 2011-11-11: qty 1

## 2011-11-11 SURGICAL SUPPLY — 35 items
ADH SKN CLS APL DERMABOND .7 (GAUZE/BANDAGES/DRESSINGS) ×1
BAG SPEC RTRVL LRG 6X4 10 (ENDOMECHANICALS)
CABLE HIGH FREQUENCY MONO STRZ (ELECTRODE) IMPLANT
CHLORAPREP W/TINT 26ML (MISCELLANEOUS) ×2 IMPLANT
CLOTH BEACON ORANGE TIMEOUT ST (SAFETY) ×2 IMPLANT
CONTAINER PREFILL 10% NBF 60ML (FORM) ×4 IMPLANT
DERMABOND ADVANCED (GAUZE/BANDAGES/DRESSINGS) ×1
DERMABOND ADVANCED .7 DNX12 (GAUZE/BANDAGES/DRESSINGS) IMPLANT
DRAPE CAMERA CLOSED 9X96 (DRAPES) ×2 IMPLANT
DRAPE HYSTEROSCOPY (DRAPE) ×2 IMPLANT
DRAPE UTILITY XL STRL (DRAPES) ×2 IMPLANT
GLOVE BIO SURGEON STRL SZ7 (GLOVE) ×4 IMPLANT
GLOVE BIOGEL PI IND STRL 7.0 (GLOVE) ×2 IMPLANT
GLOVE BIOGEL PI INDICATOR 7.0 (GLOVE) ×2
GOWN PREVENTION PLUS LG XLONG (DISPOSABLE) ×4 IMPLANT
NDL INSUFFLATION 14GA 120MM (NEEDLE) ×1 IMPLANT
NEEDLE GYRUS 33CM (NEEDLE) IMPLANT
NEEDLE INSUFFLATION 14GA 120MM (NEEDLE) ×2 IMPLANT
NS IRRIG 1000ML POUR BTL (IV SOLUTION) ×2 IMPLANT
PACK LAPAROSCOPY BASIN (CUSTOM PROCEDURE TRAY) ×2 IMPLANT
PACK VAGINAL MINOR WOMEN LF (CUSTOM PROCEDURE TRAY) ×2 IMPLANT
POUCH SPECIMEN RETRIEVAL 10MM (ENDOMECHANICALS) IMPLANT
SEALER TISSUE G2 CVD JAW 35 (ENDOMECHANICALS) IMPLANT
SEALER TISSUE G2 CVD JAW 45CM (ENDOMECHANICALS) IMPLANT
SET GENESYS HTA PROCERVA (MISCELLANEOUS) ×2 IMPLANT
SET IRRIG TUBING LAPAROSCOPIC (IRRIGATION / IRRIGATOR) IMPLANT
SUT VIC AB 3-0 X1 27 (SUTURE) IMPLANT
SUT VICRYL 0 UR6 27IN ABS (SUTURE) ×4 IMPLANT
SUT VICRYL 4-0 PS2 18IN ABS (SUTURE) ×2 IMPLANT
TOWEL OR 17X24 6PK STRL BLUE (TOWEL DISPOSABLE) ×4 IMPLANT
TRAY FOLEY CATH 14FR (SET/KITS/TRAYS/PACK) ×2 IMPLANT
TROCAR Z-THREAD BLADED 11X100M (TROCAR) ×4 IMPLANT
TROCAR Z-THREAD BLADED 5X100MM (TROCAR) ×4 IMPLANT
WARMER LAPAROSCOPE (MISCELLANEOUS) ×2 IMPLANT
WATER STERILE IRR 1000ML POUR (IV SOLUTION) ×2 IMPLANT

## 2011-11-11 NOTE — Transfer of Care (Signed)
Immediate Anesthesia Transfer of Care Note  Patient: Megan Kemp  Procedure(s) Performed:  LAPAROSCOPY OPERATIVE; DILATATION & CURETTAGE/HYSTEROSCOPY WITH HYDROTHERMAL ABLATION - Procedure failed  Patient Location: PACU  Anesthesia Type: General  Level of Consciousness: sedated  Airway & Oxygen Therapy: Patient Spontanous Breathing and Patient connected to nasal cannula oxygen  Post-op Assessment: Report given to PACU RN  Post vital signs: stable  Complications: No apparent anesthesia complications

## 2011-11-11 NOTE — Op Note (Signed)
Megan Kemp PROCEDURE DATE: 11/11/2011   PREOPERATIVE DIAGNOSIS: Menorrhagia, fibroids, chronic pelvic pain, malpositioned IUD POSTOPERATIVE DIAGNOSIS: The same PROCEDURE: Operative laparoscopy, dilation and curettage, hysteroscopy, failed Hydrothermal Ablation SURGEON:  Dr. Jaynie Collins ASSISTANT: Dr. Scheryl Darter  INDICATIONS: 50 y.o. G1P1001  with history of menorrhagia and fibroids, and recently discovered misplaced IUD here desiring surgical management.  Patient had a Mirena IUD placed in the office for management of menorrhagia.  When she returned for the IUD check, the strings were not able to be visualized.  Furthermore, the IUD was not able to be visualized on ultrasound, but was seen on pelvic Xray and was noted to be overlying the right hemipelvis. Surgical removal of the malpositioned IUD and management of her menorrhagia was recommended.  Patient was counseled about the aforementioned procedures.   Risks of surgery were discussed with the patient including but not limited to: bleeding which may require transfusion or reoperation; infection which may require antibiotics; injury to bowel, bladder, ureters or other surrounding organs; need for additional procedures including laparotomy; thromboembolic phenomenon, incisional problems and other postoperative/anesthesia complications. Written informed consent was obtained.    FINDINGS:  Large uterus with multiple fibroids seen on the subserosal surface.  Normal adnexa bilaterally.  Multiple diffuse lesions of probable dark red and black endometriosis and adhesions on the pelvic peritoneum and posterior cul-de-sac, also involving the appendix and omentum.  Normal upper abdomen.  ANESTHESIA:    General INTRAVENOUS FLUIDS: 2000 ml ESTIMATED BLOOD LOSS: 15 ml URINE OUTPUT: 200 ml SPECIMENS: Peritoneal biopsy, endometrial curettings COMPLICATIONS: None immediate  PROCEDURE IN DETAIL:  The patient received intravenous antibiotics and had  sequential compression devices applied to her lower extremities while in the preoperative area.  She was then taken to the operating room where general anesthesia was administered and was found to be adequate.  She was placed in the dorsal lithotomy position, and was prepped and draped in a sterile manner.  A Foley catheter was inserted into her bladder and attached to constant drainage and a uterine manipulator was then advanced into the uterus .  After an adequate timeout was performed, attention was then turned to the patient's abdomen where a 10-mm skin incision was made on the umbilical fold.  The Veress needle was carefully introduced into the peritoneal cavity into the abdominal wall.  Intraperitoneal placement was confirmed by drop in intraabdominal pressure with insufflation of carbon dioxide gas.  Adequate pneumoperitoneum was obtained, and the 10-mm trocar and sleeve were then advanced without difficulty into the abdomen where intraabdominal placement was confirmed by the laparoscope. A survey of the patient's pelvis and abdomen revealed the findings above. Bilateral 5-mm lower quadrant ports  were then placed under direct visualization.  Forceps and scissors were placed through these ports and a biopsy was taken of the peritoneum lesion.  Extensive exploration of the pelvis and abdomen was done, and the IUD was not seen.  The decision was made to proceed with the hysteroscopy.  The abdomen was desufflated and all trocars were left in place.  Attention was then turned to the pelvis where a speculum was placed in the patient's vagina and a single tooth tenaculum was applied to the anterior lip of the cervix.   A paracervical block using 20 ml of 0.5% Marcaine was administered.  The cervix was dilated manually with metal dilators to accommodate the HTA  hysteroscope.  Once the cervix was dilated, forceps were then advanced into the uterus to attempt to palpate the  IUD.  The IUD was palpated in the upper  right side of the fundus and noted to be embedded.  It was grasped with the forceps, and was able to be removed intact.  A sharp curettage was then performed to obtain a moderate amount of endometrial curettings. The HTA hysteroscope was inserted.   The uterine cavity was carefully examined; diffusely proliferative endometrium was noted and a large uterine cavity was noted.  An attempt was made to carry out the hydrothermal ablation, and it proceeded for a total of about three minutes, when the machine registered a malfunction due to fluid loss.  No leaks were seen and no perforation was observed but the machine did not allow the ablation to proceed. The endometrium was noted to have mild amount of ablation.  Given the size of the uterus, no other ablation modality could be used and after trying to troubleshoot the machine, it still did not work.  The decision was made to abort the procedure.  The hysteroscope was removed under direct visualization. .  No complications were observed.  The tenaculum was removed from the anterior lip of the cervix, and the vaginal speculum was removed after noting good hemostasis.    Attention was then returned to her abdomen which was insufflated again with carbon dioxide gas.  The laparoscope was used to survey the pelvis and no uterine perforation or fluid extravasation was noted.  No intraoperative injury to surrounding organs was noted.  The biopsy site was noted to be hemostatic. The abdomen was desufflated and all instruments were then removed from the patient's abdomen. The fascial incision was repaired with 0 Vicryl, and all skin incisions were closed with Dermabond. The patient tolerated the procedures well.  All instruments, needles, and sponge counts were correct x 2. The patient tolerated the procedures well and was taken to the recovery area awake, extubated and in stable condition.  The patient will be discharged to home as per PACU criteria.  Routine postoperative  instructions given.  She was prescribed Percocet, Ibuprofen and Colace.  She will follow up in the clinic on 12/06/11  for postoperative evaluation and for further discussion about surgical findings and management of her menorrhagia and fibroids.  If the biopsy is concerning for endometriosis, she may need definitive surgery in the form of a total abdominal hysterectomy and bilateral salpingo-oophorectomy.  This will be discussed with the patient during her postoperative visit.   Jaynie Collins, M.D. 11/11/2011 3:02 PM

## 2011-11-11 NOTE — Interval H&P Note (Signed)
History and Physical Interval Note: 11/11/2011 12:22 PM   Megan Kemp  has presented today for surgery, with the diagnosis of Malpositioned IUD and menorrhagia.  The various methods of treatment have been discussed with the patient and family. After consideration of risks, benefits and other options for treatment, the patient has consented to  Procedure(s): LAPAROSCOPY OPERATIVE, DILATATION & CURETTAGE/HYSTEROSCOPY WITH HYDROTHERMAL ABLATION as a surgical intervention .  The patients' history has been reviewed, patient examined, no change in status, stable for surgery.  I have reviewed the patients' chart and labs.  Questions were answered to the patient's satisfaction.  To OR when ready.   Jaynie Collins A  MD

## 2011-11-11 NOTE — Anesthesia Postprocedure Evaluation (Signed)
Anesthesia Post Note  Patient: Megan Kemp  Procedure(s) Performed:  LAPAROSCOPY OPERATIVE; DILATATION & CURETTAGE/HYSTEROSCOPY WITH HYDROTHERMAL ABLATION - Procedure failed  Anesthesia type: GA  Patient location: PACU  Post pain: Pain level controlled  Post assessment: Post-op Vital signs reviewed  Last Vitals:  Filed Vitals:   11/11/11 1203  Pulse: 72  Temp: 36.9 C  Resp: 16    Post vital signs: Reviewed  Level of consciousness: sedated  Complications: No apparent anesthesia complications

## 2011-11-11 NOTE — H&P (View-Only) (Signed)
Subjective:     Patient ID: Megan Kemp, female   DOB: 11/28/1961, 50 y.o.   MRN: [id number]  HPI 50 y.o. G1P1001 here today for followup ultrasound results for IUD surveillance. Mirena IUD placed on 07/26/11 for menorrhagia; IUD strings not visualized on follow up visit. Pelvic ultrasound also did not show IUD, but reported that the endometrium was obscured by fibroids.  Abdominal and pelvic radiographs were ordered, patient is here today to follow up results.  Denies any current bleeding or any other concerns.  Patient had a benign endometrial biopsy.  Dg Pelvis 1-2 Views 10/25/2011  *RADIOLOGY REPORT*  Clinical Data: Misplaced IUD  PELVIS - 1-2 VIEW  Comparison: Pelvic ultrasound - 09/10/2011  Findings: Intrauterine device overlies the right hemi pelvis. Multiple phleboliths are identified within the pelvis.  Minimal avulsive change about the bilateral iliac crests.  No fracture. Regional soft tissues are normal.  IMPRESSION: An IUD overlies the right hemipelvis, however exact positioning is indeterminate on this examination.  Further evaluation with direct visualization may be performed as clinically indicated.   Dg Abd 2 Views 10/25/2011  *RADIOLOGY REPORT*  Clinical Data: Abdominal plane, misplaced IUD  ABDOMEN - 2 VIEW  Comparison: Pelvic radiographs - earlier same day; pelvic ultrasound - 09/10/2011  Findings: An IUD overlies the right hemi pelvis.  Multiple phleboliths are identified within the pelvis.  Nonobstructive bowel gas pattern.  No acute osseous abnormalities.  IMPRESSION: An IUD overlies the right hemipelvis, however exact positioning is indeterminate on this examination.  Further evaluation with direct visualization may be performed as clinically indicated.  09/10/11 TRANSABDOMINAL AND TRANSVAGINAL ULTRASOUND OF PELVIS  Uterus: 14.0 x 8.8 x 7.0 cm. Multiple fibroids noted as follows: Right lateral uterine fundus, 3.2 x 3.1 x 2.9 cm, predominately subserosal, minimally intramural.  Anterior uterine body intramural fibroid with some posterior displacement upon the endometrial stripe, 4.2 x 4.1 x 3.9 cm Left lateral uterine body/lower uterine segment, intramural, 4.9 x 5.0 x 6.6 cm  Endometrium: Not visualized. IUD not identified.  Right ovary: 3.1 x 2.6 x 1.9 cm. Normal. Left ovary: 3.4 x 2.4 x 2.2 cm. Normal.  Other findings: No free fluid  IMPRESSION: Multiple fibroids as above. IUD not visualized, but the endometrium is obscured by overlying fibroids. Consider abdominal radiograph to determine whether the IUD is still present but  obscured sonographically.   The following portions of the patient's history were reviewed and updated as appropriate: allergies, current medications, past family history, past medical history, past social history, past surgical history and problem list.  Review of Systems Negative    Objective:   Physical Exam  Constitutional: She is oriented to person, place, and time. Vital signs are normal. She appears well-developed.       Blood pressure 106/74, pulse 83, temperature 98.1 F (36.7 C), temperature source Oral, height 5' 8" (1.727 m), weight 179 lb 8 oz (81.421 kg).   Cardiovascular: Normal rate.   Pulmonary/Chest: Breath sounds normal.  Abdominal: Soft. There is tenderness.       Lower abdominal tenderness to palpation in left and right lower quadrants, no rebound or guarding.  No abnormal masses.  Genitourinary:       Deferred  Musculoskeletal: Normal range of motion.  Neurological: She is oriented to person, place, and time.      Assessment:    Patient with malpositioned Mirena IUD, placed to help with menorrhagia.    Plan:   Discussed findings with patient and emphasized that this is a   known complication of Mirena IUD placement. Given the position of the IUD on radiograph, there is concern about the IUD being involved with bowel. Patient was counseled that she will need surgery to locate and remove the malpositioned IUD.   Patient also wants surgical management for her menorrhagia. Recommended laparoscopy for the malpositioned IUD, and hysteroscopic hydrothermal ablation for her menorrhagia.  The risks of surgery were discussed in detail with the patient including but not limited to: bleeding which may require transfusion or reoperation; infection which may require prolonged hospitalization or re-hospitalization and antibiotic therapy; injury to bowel, bladder, ureters and major vessels or other surrounding organs; need for additional procedures including laparotomy; thromboembolic phenomenon, incisional problems and other postoperative or anesthesia complications. All questions were answered.  Surgery was scheduled on Monday November 11, 2011 at 1pm; routine preoperative instructions of having nothing to eat or drink after midnight on the day prior to surgery and also coming to the hospital 1 1/2 hours prior to her time of surgery were also emphasized.  She was told she may be called for a preoperative appointment about a week prior to surgery and will be given further preoperative instructions at that visit.  Patient education handout about the procedures was given to patient.  Appropriate support was given to the patient during this clinic encounter in light of this complication.  She was told to call the clinic or come to the [facility] for any preoperative or other gynecologic concerns.       

## 2011-11-11 NOTE — Anesthesia Preprocedure Evaluation (Signed)
Anesthesia Evaluation  Patient identified by MRN, date of birth, ID band Patient awake    Reviewed: Allergy & Precautions, H&P , NPO status , Patient's Chart, lab work & pertinent test results, reviewed documented beta blocker date and time   History of Anesthesia Complications (+) PONV  Airway Mallampati: I TM Distance: >3 FB Neck ROM: full    Dental  (+) Partial Upper and Partial Lower   Pulmonary neg pulmonary ROS,  clear to auscultation  Pulmonary exam normal       Cardiovascular regular Normal    Neuro/Psych  Headaches, PSYCHIATRIC DISORDERS (depression, anxiety, panic attacks)    GI/Hepatic Neg liver ROS, GERD-  Medicated,  Endo/Other  Negative Endocrine ROS  Renal/GU negative Renal ROS  Genitourinary negative   Musculoskeletal   Abdominal   Peds  Hematology negative hematology ROS (+)   Anesthesia Other Findings   Reproductive/Obstetrics negative OB ROS                           Anesthesia Physical Anesthesia Plan  ASA: II  Anesthesia Plan: General   Post-op Pain Management:    Induction: Intravenous  Airway Management Planned: Oral ETT  Additional Equipment:   Intra-op Plan:   Post-operative Plan: Extubation in OR  Informed Consent: I have reviewed the patients History and Physical, chart, labs and discussed the procedure including the risks, benefits and alternatives for the proposed anesthesia with the patient or authorized representative who has indicated his/her understanding and acceptance.   Dental Advisory Given  Plan Discussed with: CRNA and Surgeon  Anesthesia Plan Comments:         Anesthesia Quick Evaluation

## 2011-11-12 ENCOUNTER — Encounter (HOSPITAL_COMMUNITY): Payer: Self-pay | Admitting: Obstetrics & Gynecology

## 2011-11-12 ENCOUNTER — Telehealth: Payer: Self-pay | Admitting: Family Medicine

## 2011-11-12 NOTE — Telephone Encounter (Signed)
Called pt and she wants Dr.Caviness to send an order for ENT and also sleep study to [facility]. I told the pt, that the doctor needs to send a report to [facility], if they want this order. Pt said, Dr.Kumar's number is # [id number] and fax # [phone]. I explained several times, that we do not just order sleep study and referrals without information from her specialist. Pt insisted that I would send this message to Dr.Caviness. Lorenda Hatchet, [name]

## 2011-11-12 NOTE — Telephone Encounter (Signed)
Checking status of referral for sleep study, anxious to get this done.

## 2011-11-12 NOTE — Plan of Care (Signed)
Patient was called at home today and operative findings were reviewed.  She will follow up in clinic on 12/06/11, and will have a discussion about further management depending on her symptoms and pathology findings. Patient denies any complications.  Routine postoperative instructions were reviewed.  Jaynie Collins, M.D. 11/12/2011 1:40 PM

## 2011-11-20 NOTE — Telephone Encounter (Signed)
Called and spoke with pt.  Pt to bring all records to appt on 12/3.  Will discuss needed referrals and WF recommendations at that time in more detail.  Pt agrees with this plan.

## 2011-11-25 ENCOUNTER — Encounter: Payer: Self-pay | Admitting: Family Medicine

## 2011-11-25 ENCOUNTER — Ambulatory Visit (INDEPENDENT_AMBULATORY_CARE_PROVIDER_SITE_OTHER): Payer: Self-pay | Admitting: Family Medicine

## 2011-11-25 DIAGNOSIS — R1310 Dysphagia, unspecified: Secondary | ICD-10-CM

## 2011-11-25 DIAGNOSIS — G43909 Migraine, unspecified, not intractable, without status migrainosus: Secondary | ICD-10-CM

## 2011-11-25 DIAGNOSIS — F329 Major depressive disorder, single episode, unspecified: Secondary | ICD-10-CM

## 2011-11-25 DIAGNOSIS — F3289 Other specified depressive episodes: Secondary | ICD-10-CM

## 2011-11-26 ENCOUNTER — Emergency Department (INDEPENDENT_AMBULATORY_CARE_PROVIDER_SITE_OTHER): Payer: Self-pay

## 2011-11-26 ENCOUNTER — Emergency Department (HOSPITAL_COMMUNITY): Payer: Self-pay

## 2011-11-26 ENCOUNTER — Emergency Department (INDEPENDENT_AMBULATORY_CARE_PROVIDER_SITE_OTHER)
Admission: EM | Admit: 2011-11-26 | Discharge: 2011-11-26 | Disposition: A | Discharge status: Home / Self Care | Payer: Self-pay | Source: Home / Self Care

## 2011-11-26 ENCOUNTER — Encounter (HOSPITAL_COMMUNITY): Payer: Self-pay | Admitting: Emergency Medicine

## 2011-11-26 DIAGNOSIS — S7000XA Contusion of unspecified hip, initial encounter: Secondary | ICD-10-CM

## 2011-11-26 DIAGNOSIS — S139XXA Sprain of joints and ligaments of unspecified parts of neck, initial encounter: Secondary | ICD-10-CM

## 2011-11-26 DIAGNOSIS — S161XXA Strain of muscle, fascia and tendon at neck level, initial encounter: Secondary | ICD-10-CM

## 2011-11-26 DIAGNOSIS — S7002XA Contusion of left hip, initial encounter: Secondary | ICD-10-CM

## 2011-11-26 LAB — POCT URINALYSIS DIP (DEVICE)
Bilirubin Urine: NEGATIVE
Glucose, UA: NEGATIVE mg/dL
Ketones, ur: NEGATIVE mg/dL
Specific Gravity, Urine: 1.025 (ref 1.005–1.030)
Urobilinogen, UA: 0.2 mg/dL (ref 0.0–1.0)

## 2011-11-26 MED ORDER — HYDROCODONE-ACETAMINOPHEN 5-325 MG PO TABS: 1.0000 | ORAL_TABLET | Freq: Four times a day (QID) | ORAL | Status: AC | PRN

## 2011-11-26 MED ORDER — IBUPROFEN 800 MG PO TABS: 800.0000 mg | ORAL_TABLET | Freq: Three times a day (TID) | ORAL | Status: AC

## 2011-11-26 MED ORDER — METHOCARBAMOL 750 MG PO TABS: 750.0000 mg | ORAL_TABLET | Freq: Four times a day (QID) | ORAL | Status: AC
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
## 2011-11-27 NOTE — ED Provider Notes (Signed)
Medical screening examination/treatment/procedure(s) were performed by non-physician practitioner and as supervising physician I was immediately available for consultation/collaboration.  Hillery Hunter, MD 11/27/11 [phone]

## 2011-12-03 NOTE — Progress Notes (Signed)
  Subjective:    Patient ID: Megan Kemp, female    DOB: 08-08-1961, 50 y.o.   MRN: [id number]  HPI Migraine headaches: Has been seen by neurology at [facility]-Dr. Lucianne Muss. Have recommended a sleep study. Dr. Lucianne Muss has also ordered vestibular testing, hearing testing, and has sent patient to an ENT. Also gave patient instructions on dietary changes to make to avoid foods that could be bring on migraine headaches.currently he is having headaches 2-3 times per week. The last 6-7 hours. Rest makes better. Movement makes worse.  Dysphasia: Patient seen by GI at [facility]. On EGD was normal-see scanned report. On colonoscopy found 2 polyps. One was removed at time of colonoscopy. The second was not removed secondary to cautery malfunction. Patient to return on 01/20/2012 for removal of the second polyp..Patient continues to have pain after eating mid chest after all food intake. GI is recommending PH/impedance testing and esophageal manometry to look further for cause of pain.   Depression: Patient states she is doing well. Taking Prozac 40 mg daily. Seen therapist regularly. Improved energy. No flight of ideas, no grandiose thoughts, no increased distractibility, no current SI or HI, no hallucinations currently, no hyperactivity or mania.   Review of Systems    as per above. Objective:   Physical Exam  Constitutional: She is oriented to person, place, and time. She appears well-developed and well-nourished.  HENT:  Head: Normocephalic and atraumatic.  Cardiovascular: Normal rate, regular rhythm and normal heart sounds.   No murmur heard. Pulmonary/Chest: Effort normal. No respiratory distress. She has no wheezes.  Abdominal: Soft. She exhibits no distension. There is no tenderness. There is no rebound and no guarding.  Musculoskeletal: She exhibits no edema.  Neurological: She is alert and oriented to person, place, and time.  Skin: No rash noted.  Psychiatric: She has a normal mood and  affect. Her behavior is normal.       No SI. No HI. No hallucinations.          Assessment & Plan:

## 2011-12-04 ENCOUNTER — Encounter: Payer: Self-pay | Admitting: Family Medicine

## 2011-12-04 NOTE — Assessment & Plan Note (Addendum)
Has been seen by neurology at [facility]-Dr. Lucianne Muss. Have recommended a sleep study. Dr. Lucianne Muss has also ordered vestibular testing, hearing testing, and has sent patient to an ENT. Also gave patient instructions on dietary changes to make to avoid foods that could be bring on migraine headaches.  Pt to go to scheduled appointments as arranged by Dr. Lucianne Muss. Will need lab draw of lft's, platlets, coag panel, and serum drug levels for depakote monitoring at f/up appt in jan 2013.

## 2011-12-04 NOTE — Assessment & Plan Note (Addendum)
Continue current treatment plan of Prozac and therapy.

## 2011-12-04 NOTE — Assessment & Plan Note (Signed)
Normal EGD. Polyps x2 found on colonoscopy--1 removed the other to be removed in January 2013. Per patient GI Dr. At [facility] is recommending pH impedance testing and esophageal manometry. Unable to access consult note in epic. Called GI clinic and asked for consult note to be sent to me. Will review in order test is recommended.

## 2011-12-06 ENCOUNTER — Ambulatory Visit (INDEPENDENT_AMBULATORY_CARE_PROVIDER_SITE_OTHER): Payer: Self-pay | Admitting: Obstetrics & Gynecology

## 2011-12-06 ENCOUNTER — Encounter: Payer: Self-pay | Admitting: Obstetrics & Gynecology

## 2011-12-06 DIAGNOSIS — D259 Leiomyoma of uterus, unspecified: Secondary | ICD-10-CM

## 2011-12-06 DIAGNOSIS — N938 Other specified abnormal uterine and vaginal bleeding: Secondary | ICD-10-CM

## 2011-12-06 DIAGNOSIS — Z09 Encounter for follow-up examination after completed treatment for conditions other than malignant neoplasm: Secondary | ICD-10-CM

## 2011-12-06 DIAGNOSIS — N949 Unspecified condition associated with female genital organs and menstrual cycle: Secondary | ICD-10-CM

## 2011-12-06 NOTE — Progress Notes (Signed)
  Subjective:     Megan Kemp is a 50 y.o. female who presents to the clinic 3 weeks status post Operative laparoscopy, dilation and curettage, hysteroscopy, partial Hydrothermal Ablation (three minutes) for abnormal uterine bleeding and malpositioned IUD.Marland Kitchen Eating a regular diet without difficulty. Bowel movements are normal. The patient is not having any pain.  She denies any bleeding since surgery, just spotting after the surgery.  The following portions of the patient's history were reviewed and updated as appropriate: allergies, current medications, past family history, past medical history, past social history, past surgical history and problem list.  Review of Systems A comprehensive review of systems was negative.    Objective:    BP 108/78  Pulse 74  Temp(Src) 97 F (36.1 C) (Oral)  Ht 5\' 8"  (1.727 m)  Wt 176 lb 8 oz (80.06 kg)  BMI 26.84 kg/m2  LMP 05/06/2011 General:  alert and no distress  Abdomen: soft, bowel sounds active, non-tender  Incision:   healing well, no drainage, no erythema, no hernia, no seroma, no swelling, no dehiscence, incision well approximated.  Normal vaginal discharge after ablation, no bleeding.    Pathology (11/11/11) 1. Peritoneum, biopsy - FIBROTIC TISSUE ASSOCIATED WITH REACTIVE MESOTHELIAL CELLS. - NO EVIDENCE OF ENDOMETRIOSIS, ATYPIA OR MALIGNANCY. 2. Endometrium, curettage - BENIGN SECRETORY ENDOMETRIUM WITH PSEUDODECIDUALIZED STROMA. NO ATYPIA, HYPERPLASIA, OR MALIGNANCY. - BENIGN SMOOTH MUSCLE BUNDLES. - BENIGN SQUAMOUS MUCOSA AND ENDOCERVICAL MUCOSA, NO DYSPLASIA OR MALIGNANCY.  Assessment:    Doing well postoperatively. Operative findings again reviewed. Pathology report discussed.    Plan:    1. Continue any current medications. 2. Continue care for other medical issues 3. Activity restrictions: none 4. Follow up: 1 year for annual exam.

## 2011-12-12 ENCOUNTER — Telehealth: Payer: Self-pay | Admitting: Family Medicine

## 2011-12-12 NOTE — Telephone Encounter (Signed)
Disability Insurance continuation form placed in Dr. Tobias Alexander box for completion.  Megan Kemp

## 2011-12-12 NOTE — Telephone Encounter (Signed)
Patient left disability form to be filled out.  Please fax when completed.

## 2011-12-12 NOTE — Telephone Encounter (Signed)
Completed form and placed in the "to be faxed" pile in the front office.

## 2011-12-13 ENCOUNTER — Encounter: Payer: Self-pay | Admitting: Family Medicine

## 2011-12-13 NOTE — Telephone Encounter (Signed)
Disability form faxed to [phone].  Megan Kemp

## 2012-01-01 ENCOUNTER — Telehealth: Payer: Self-pay | Admitting: Family Medicine

## 2012-01-01 NOTE — Telephone Encounter (Signed)
Called and LMOM for them to fax records on pt.

## 2012-01-09 ENCOUNTER — Ambulatory Visit (INDEPENDENT_AMBULATORY_CARE_PROVIDER_SITE_OTHER): Payer: Self-pay | Admitting: Family Medicine

## 2012-01-09 ENCOUNTER — Encounter: Payer: Self-pay | Admitting: Family Medicine

## 2012-01-09 DIAGNOSIS — G43909 Migraine, unspecified, not intractable, without status migrainosus: Secondary | ICD-10-CM

## 2012-01-09 DIAGNOSIS — R1310 Dysphagia, unspecified: Secondary | ICD-10-CM

## 2012-01-09 DIAGNOSIS — F3289 Other specified depressive episodes: Secondary | ICD-10-CM

## 2012-01-09 DIAGNOSIS — R252 Cramp and spasm: Secondary | ICD-10-CM

## 2012-01-09 DIAGNOSIS — F329 Major depressive disorder, single episode, unspecified: Secondary | ICD-10-CM

## 2012-01-09 MED ORDER — DICLOFENAC SODIUM 1 % TD GEL: 1.0000 "application " | Freq: Four times a day (QID) | TRANSDERMAL | Status: DC | PRN

## 2012-01-09 NOTE — Patient Instructions (Signed)
Depression: Continue to tap in to your support system.  And do the thing that you have been doing- turn lights on , exercise, etc to help symptoms.  Remember safety plan.   Difficulty swallowing: I am going to request the records again.  I am going to call later today to see if I can speak to your doctor directly on the phone to get the recommeded tests ordered.   Migraines: Blood work today.  i will send your results to you in the mail.  Continue workup as you are already doing with [facility] neurlogy.

## 2012-01-10 ENCOUNTER — Encounter: Payer: Self-pay | Admitting: Family Medicine

## 2012-01-10 LAB — COMPREHENSIVE METABOLIC PANEL
Albumin: 4.3 g/dL (ref 3.5–5.2)
BUN: 10 mg/dL (ref 6–23)
CO2: 28 mEq/L (ref 19–32)
Calcium: 9.2 mg/dL (ref 8.4–10.5)
Chloride: 103 mEq/L (ref 96–112)
Glucose, Bld: 97 mg/dL (ref 70–99)
Potassium: 4.1 mEq/L (ref 3.5–5.3)
Sodium: 138 mEq/L (ref 135–145)
Total Protein: 6.8 g/dL (ref 6.0–8.3)

## 2012-01-10 LAB — CBC
Hemoglobin: 12.8 g/dL (ref 12.0–15.0)
Platelets: 396 10*3/uL (ref 150–400)
RBC: 4.45 MIL/uL (ref 3.87–5.11)

## 2012-01-10 LAB — PROTIME-INR
INR: 0.97 (ref <1.50)
Prothrombin Time: 13.3 seconds (ref 11.6–15.2)

## 2012-01-13 NOTE — Assessment & Plan Note (Signed)
rx given to pt for voltaren for hand pain/cramping.  Will f/up on this at next appt.

## 2012-01-13 NOTE — Assessment & Plan Note (Signed)
Continue current treatment with prozac.  Low threshold for following through with referral to psych if no continued improvement.  Pt wanted to wait at this time since currently having dysphagia and migraines worked up.

## 2012-01-13 NOTE — Assessment & Plan Note (Signed)
Pt to f/up with GI team at [facility] as scheduled.  Will request consult note.

## 2012-01-13 NOTE — Assessment & Plan Note (Signed)
depakote monitoring labs ordered today.  Pt to f/up with neurology at [facility] as planned.  Will request consult note.

## 2012-01-13 NOTE — Progress Notes (Signed)
  Subjective:    Patient ID: Megan Kemp, female    DOB: 1961-12-21, 51 y.o.   MRN: [id number]  HPI Migraines: Having approx 3 x per week, last approx 6 hours, sometimes has spinning sensation with migraine, +light and noise sensitivity,  Current work up at [facility] neurology consisting of: ENT consult, hearing test, sleep apnea test. Pt to call and schedule a f/up appt with neurology to review treatment plan s/p completion of these tests.    Dysphagia: Pt states that symptoms continue.  Difficulty swallowing solids.  Usually eats soft foods and small bites of chicken.  No changes in weight. Pt recently has gained a few pounds.  Being seen by GI team at [facility] for further work up.  Consult note has not been sent to me yet.  Pt has 2nd colon polyp that is going to be removed at the end of this month.  Pt also has a f/up appt with GI at beginning of februrary.  Still unclear if ph/impedence testing should be done here at [facility] or at [facility].  Will see what consult note says.   Depression: Pt states that she is no longer seeing therapist frequently due to finances- seeing Lucina Mellow- approx every 2-3 months. .  She is going to apply for charity care through [employer].  She states that she has decreased tearfullness, keeping lights on at home.  Has good support by prayer team at her church.  No SI or HI currently.    Hand pain: Continues to have hand pain bilateral.   Reports cramping in hands bilateral that improves on with massage.  Also helps her to use gloves.  Was prescribed voltaren gel in past but was too expensive.  Pt asking for another rx for this to see if she can find a better price on it.  No joint redness. No joint swelling.       Review of Systems As per above.     Objective:   Physical Exam  Constitutional: She is oriented to person, place, and time. She appears well-developed and well-nourished.  Cardiovascular: Normal rate.   Pulmonary/Chest: Effort  normal. No respiratory distress. She has no wheezes.  Abdominal: Soft. She exhibits no distension. There is no tenderness. There is no rebound and no guarding.  Musculoskeletal: She exhibits no edema.       Hands: Full rom, good grip strength, no redness or swelling.  No rash.  No changes in sensation.   Neurological: She is alert and oriented to person, place, and time. She displays normal reflexes. No cranial nerve deficit. She exhibits normal muscle tone. Coordination normal.  Skin: No rash noted.  Psychiatric: She has a normal mood and affect.       No SI or HI          Assessment & Plan:

## 2012-01-29 ENCOUNTER — Ambulatory Visit: Payer: Self-pay | Admitting: Family Medicine

## 2012-02-18 ENCOUNTER — Encounter: Payer: Self-pay | Admitting: Family Medicine

## 2012-02-18 ENCOUNTER — Ambulatory Visit (INDEPENDENT_AMBULATORY_CARE_PROVIDER_SITE_OTHER): Payer: Medicaid Other | Admitting: Family Medicine

## 2012-02-18 DIAGNOSIS — F329 Major depressive disorder, single episode, unspecified: Secondary | ICD-10-CM

## 2012-02-18 DIAGNOSIS — R252 Cramp and spasm: Secondary | ICD-10-CM

## 2012-02-18 DIAGNOSIS — R1310 Dysphagia, unspecified: Secondary | ICD-10-CM

## 2012-02-18 DIAGNOSIS — F3289 Other specified depressive episodes: Secondary | ICD-10-CM

## 2012-02-18 DIAGNOSIS — G43909 Migraine, unspecified, not intractable, without status migrainosus: Secondary | ICD-10-CM

## 2012-02-23 ENCOUNTER — Encounter: Payer: Self-pay | Admitting: Family Medicine

## 2012-02-23 DIAGNOSIS — G4730 Sleep apnea, unspecified: Secondary | ICD-10-CM | POA: Insufficient documentation

## 2012-02-23 MED ORDER — DICLOFENAC SODIUM 1 % TD GEL: 1.0000 "application " | Freq: Four times a day (QID) | TRANSDERMAL | Status: DC | PRN

## 2012-02-23 NOTE — Assessment & Plan Note (Signed)
Continue to see therapist as scheduled.  Consider repeat of PHQ9 test at next f/up appt.

## 2012-02-23 NOTE — Assessment & Plan Note (Addendum)
Significant improvement with diclofenac cream.  Will send in refill. Physical exam of hands wnl.

## 2012-02-23 NOTE — Assessment & Plan Note (Addendum)
Recheck depakote levels in 12/2012.  Continue to use CPaP machine.  Follow up with neurology clinic at [facility] as directed.

## 2012-02-23 NOTE — Progress Notes (Signed)
  Subjective:    Patient ID: Megan Kemp, female    DOB: 09/25/1961, 51 y.o.   MRN: [id number]  HPI Hand numbness/cramping: Improved with diclofenac cream that was prescribed a couple of months ago.  Would like refill of this medication.   Colonoscopy f/up: Pt returned in [place] for 2nd polyp removal in Jan.  Per pt the polyp had grown a suprising amount and the GI doctor said he was glad she came back b/c this polyp grew fast and it would have been a cancer in time.  Pt to request that this records be sent to me for my review. This was found on routine screening colonoscopy.   Dyspagia: Seeing [facility] doctor/ GI consult.  Following appointments scheduled: 04/15/12- esophageal manometry and 24 hour ph/impedence.   02/21/12- barium swallow repeat to see if any changes since film done at [facility]. Pt continues to have sensation that food gets stuck in her throat.   Headaches: Having migraines 3x per week.  Not as severe as in the past.  Being followed by neurology department at [facility].  Neurologist felt that being treated for sleep apnea may help with migraines.  Pt just started on CPAP machine after recent sleep study indicated apnea.  Having some problems with mask but states she is able to tolerate over all.   No recent falls. No n/v/d.     Review of Systems    as per above. Objective:   Physical Exam  Constitutional: She appears well-developed and well-nourished.  HENT:  Head: Normocephalic and atraumatic.  Cardiovascular: Normal rate, regular rhythm and normal heart sounds.   No murmur heard. Pulmonary/Chest: Effort normal. She has no wheezes.  Abdominal: Soft. She exhibits no distension. There is no tenderness.  Musculoskeletal: She exhibits no edema.       No hand swelling, no hand redness.  Full rom of hands bilateral.   Neurological: She is alert.       Walks with walker.  Good even gait stride.  Normal speed.    Skin: No rash noted.          Assessment & Plan:

## 2012-02-23 NOTE — Assessment & Plan Note (Signed)
Go to scheduled GI Studies: 04/15/12- esophageal manometry and 24 hour ph/impedence.  02/21/12- barium swallow repeat to see if any changes since film done at [facility].

## 2012-03-03 ENCOUNTER — Encounter: Payer: Self-pay | Admitting: Family Medicine

## 2012-03-03 ENCOUNTER — Ambulatory Visit (INDEPENDENT_AMBULATORY_CARE_PROVIDER_SITE_OTHER): Payer: Medicaid Other | Admitting: Family Medicine

## 2012-03-03 DIAGNOSIS — G43909 Migraine, unspecified, not intractable, without status migrainosus: Secondary | ICD-10-CM

## 2012-03-03 DIAGNOSIS — R1310 Dysphagia, unspecified: Secondary | ICD-10-CM

## 2012-03-03 DIAGNOSIS — R21 Rash and other nonspecific skin eruption: Secondary | ICD-10-CM

## 2012-03-03 MED ORDER — HYDROCORTISONE 0.5 % EX CREA: TOPICAL_CREAM | Freq: Two times a day (BID) | CUTANEOUS | Status: DC

## 2012-03-03 MED ORDER — CETIRIZINE HCL 10 MG PO TABS: 10.0000 mg | ORAL_TABLET | Freq: Every day | ORAL | Status: DC

## 2012-03-03 NOTE — Progress Notes (Signed)
  Subjective:    Patient ID: Megan Kemp, female    DOB: July 11, 1961, 51 y.o.   MRN: [id number]  HPI Patient here for followup appointment.  Dysphasia: Patient states that symptoms are continuing. Still feels like food gets stuck in the esophagus in area sternum. Had repeat barium study that showed within normal limits-per patient. I have not reviewed these results.  This was done at [facility]. Patient is scheduled for pH and manometry testing in April. Being followed by [facility]. Patient eating soft foods and liquids. A voiding higher carbs and steak/beef which seem to make dysphagia worse.  Migraine: Patient states that migraines have improved since she has been using CPAP machine more regularly. Patient has been using it regularly during the past couple of weeks. Has had no migraines for the past week and a half. Patient states that she is taking Depakote as directed.  Form completion: Patient would like form completed but states she is receiving care here in the clinic. She is going to turn this into a company that she has outstanding bill due- for car?  Patient is undergoing appeal for disability. Patient currently not employed.    Rash: Patient reports red skin rash that comes and goes. This has been off and on times to 3 weeks. Changed onto started to gain and began to have problems with this. Has now switched back to her regular washing detergent but continues to have these red splotches on her skin off and on. Seems to be most of her arms and torso. Would like something to help with these red spots and itching. States that this is worse when she wakes up in the morning. Has washed her sheets once with the other detergent.no difficulty breathing. No lip or tongue swelling. No changes in voice.    Review of Systems    as per above. Objective:   Physical Exam  Constitutional: She is oriented to person, place, and time. She appears well-developed and well-nourished.  Eyes:  Pupils are equal, round, and reactive to light.  Cardiovascular: Normal rate, regular rhythm and normal heart sounds.   No murmur heard. Pulmonary/Chest: Effort normal and breath sounds normal. No respiratory distress. She has no wheezes.  Abdominal: Soft. She exhibits no distension and no mass. There is no tenderness. There is no rebound and no guarding.  Musculoskeletal: She exhibits no edema.       Normal, even gait.  Able to get onto exam table without problems.  Uses walker.   Neurological: She is alert and oriented to person, place, and time. Coordination normal.  Skin: Rash noted.       1 quarter sized-circular smooth raised area with a slight red hue on left upper medial arm.   No other skin lesions on exam.   Psychiatric: She has a normal mood and affect.          Assessment & Plan:

## 2012-03-03 NOTE — Patient Instructions (Signed)
Dysphagia: Follow up with [facility] GI as scheduled  Migraines: So glad to hear that they are improving with the CPAP.  Keep track of migraines so we can measure for sure that they are improving.   Rash: Take zytec as directed.  Take hydrocortisone cream as directed.   Return in 1 month to see me or sooner if needed.

## 2012-03-08 DIAGNOSIS — R21 Rash and other nonspecific skin eruption: Secondary | ICD-10-CM | POA: Insufficient documentation

## 2012-03-08 NOTE — Assessment & Plan Note (Signed)
Continue CPAP since this seems to be helping with migraines.  Pt to f/up with [facility] nuerology as scheduled.

## 2012-03-08 NOTE — Assessment & Plan Note (Signed)
Avoid new detergents or scents that could be causing reaction.  Zyrtec as needed.  Hydrocortisone to give relief to area that it itching on arm.  Return for new or worsening of symptoms.

## 2012-03-08 NOTE — Assessment & Plan Note (Signed)
Barium swallow wnl at [facility] per patient.  Pt to request records from [facility] GI.  PH and manometry studies pending to be performed at [facility].

## 2012-03-26 ENCOUNTER — Other Ambulatory Visit: Payer: Self-pay | Admitting: Family Medicine

## 2012-04-21 ENCOUNTER — Ambulatory Visit (INDEPENDENT_AMBULATORY_CARE_PROVIDER_SITE_OTHER): Payer: Medicaid Other | Admitting: Family Medicine

## 2012-04-21 ENCOUNTER — Encounter: Payer: Self-pay | Admitting: Family Medicine

## 2012-04-21 VITALS — BP 109/70 | HR 76 | Ht 68.0 in | Wt 182.0 lb

## 2012-04-21 DIAGNOSIS — R252 Cramp and spasm: Secondary | ICD-10-CM

## 2012-04-21 DIAGNOSIS — G43909 Migraine, unspecified, not intractable, without status migrainosus: Secondary | ICD-10-CM

## 2012-04-21 DIAGNOSIS — M6250 Muscle wasting and atrophy, not elsewhere classified, unspecified site: Secondary | ICD-10-CM

## 2012-04-21 DIAGNOSIS — R1310 Dysphagia, unspecified: Secondary | ICD-10-CM

## 2012-04-21 DIAGNOSIS — R29898 Other symptoms and signs involving the musculoskeletal system: Secondary | ICD-10-CM

## 2012-04-21 LAB — BASIC METABOLIC PANEL
BUN: 12 mg/dL (ref 6–23)
Calcium: 8.8 mg/dL (ref 8.4–10.5)
Glucose, Bld: 87 mg/dL (ref 70–99)
Potassium: 3.8 mEq/L (ref 3.5–5.3)
Sodium: 139 mEq/L (ref 135–145)

## 2012-04-21 LAB — LDL CHOLESTEROL, DIRECT: Direct LDL: 72 mg/dL

## 2012-04-21 MED ORDER — DICLOFENAC SODIUM 1 % TD GEL: 1.0000 "application " | Freq: Four times a day (QID) | TRANSDERMAL | Status: DC | PRN

## 2012-04-21 NOTE — Patient Instructions (Addendum)
Difficulty swallowing: I am going to get records from GI specialist so we will know how to proceed.  Hand cramping and leg cramping: I will send in prescription for voltaren.  We will recheck your blood today to make sure that your electrolytes and B12 remain normal. - I will mail you your results.   Migraine:  continue taking depakote. Call and get f/up appointment with neurology.   Balance/strength: I am going to send you to PT to work on building back strength and balance so you can hopefully decrease the amount of time you use cane and walker- or completely get independent again.   Return in 4-6 weeks for follow up.

## 2012-04-21 NOTE — Progress Notes (Signed)
  Subjective:    Patient ID: Megan Kemp, female    DOB: 03-21-1961, 51 y.o.   MRN: [id number]  HPI Dysphagia- Micah Flesher on April 24th and 25th when for esophageal tests with GI specialist at [facility].  Waiting for test results.  Still feel like food gets stuck- escpecially if she eats red meat and bread.  Able to mash potato, chopped meats, and bananas easily.  Feels the sensation that food gets stuck at least 1 x per day.  Has to belch and drink fluids to get the sensation to go away.  Usually takes 1 hour for sensation to resolve.  No vomiting.  No nausea.  No diarrhea.  No sorethroat. GI doctor to call with results within 10 days.  No changes in weight.  Migraine- Doing well, did have h/a with seasonal allergies x 2 weeks -this was 2 weeks ago.  Now headaches have improved.  For past 2 weeks- have 2 per week- last all day. No n/v.  Pain located in forehead area.  Has blacks spots in vision just before migraine.  Being seen by neurologist at [facility].  No longer using cpap- x 30 days.  Causes throat dryness and runny nose. Feels that migraines are much better controlled now with depakote.  Balance- Uses cane and walker due to leg cramping. Leg cramping off and on in legs bilateral- usually has cramping episode 3 x per week and feels like she may fall. Massage x 30 min will give relief.  Has used walker and cane consistently over the past year or so.   Hand pain- Hand cramping continues- daily.  Massage and patting hands makes them feel better.  Feel numbness in back and front of fingers.  voltaren gel seems to help- uses 1-3 x per day.   No feet numbness.      Review of Systems As per above    Objective:   Physical Exam  Constitutional: She appears well-developed and well-nourished. No distress.  HENT:  Head: Normocephalic and atraumatic.  Mouth/Throat: Oropharynx is clear and moist. No oropharyngeal exudate.  Eyes: Right eye exhibits no discharge. Left eye exhibits no discharge.    Neck: Normal range of motion.  Cardiovascular: Normal rate.   Pulmonary/Chest: Effort normal. No respiratory distress.  Abdominal: Soft. She exhibits no distension. There is no tenderness.  Musculoskeletal: She exhibits no edema.       Normal gait without using cane or walker.  Normal strength in legs and arms bilateral.  No rash on ext.  Normal reflexes bilateral in lower ext.   Neurological: She is alert.  Skin: No rash noted.  Psychiatric: She has a normal mood and affect.       No SI or HI.          Assessment & Plan:

## 2012-04-22 ENCOUNTER — Encounter: Payer: Self-pay | Admitting: Family Medicine

## 2012-04-23 ENCOUNTER — Encounter: Payer: Self-pay | Admitting: Family Medicine

## 2012-04-23 DIAGNOSIS — R29898 Other symptoms and signs involving the musculoskeletal system: Secondary | ICD-10-CM | POA: Insufficient documentation

## 2012-04-23 NOTE — Assessment & Plan Note (Signed)
Better controlled on depakote.  Pt is being seen by neurologist at [facility].  Encouraged to start using cpap again- since this helped with migraines and because of other health issues that sleep apnea can cause.  Will request last neuro note from [facility].

## 2012-04-23 NOTE — Assessment & Plan Note (Signed)
Unsure of etiology of leg cramping 3 x per week.  Will recheck electrolytes, K, B12.  I am concerned that there may be a component of deconditioning since pt has limited her activity and is using walker and cane at all times at home.  On exam, gait is normal unassisted.  Pt agrees to go to PT to work on strengthening of legs, will also ask for evaluation to see if they can help [place] identify possible causes of leg cramping, also would like pt to work on balance.  I think that PT will help pt gain strength and confidence to no longer rely on assistive devices.

## 2012-04-23 NOTE — Assessment & Plan Note (Signed)
Lab work pending- see below.  Will continue voltaren cream since pt states that this helps symptoms.

## 2012-04-23 NOTE — Assessment & Plan Note (Signed)
Will request GI consult note from [facility].  Pt to go to f/up appointment as scheduled.

## 2012-04-27 ENCOUNTER — Telehealth: Payer: Self-pay | Admitting: Family Medicine

## 2012-04-27 NOTE — Telephone Encounter (Signed)
Patient is calling for a refill on Motrin sent to [facility] because she is still having pain in leg and hand and generally in her body.

## 2012-04-28 ENCOUNTER — Other Ambulatory Visit: Payer: Self-pay | Admitting: Family Medicine

## 2012-04-28 MED ORDER — IBUPROFEN 600 MG PO TABS: 600.0000 mg | ORAL_TABLET | Freq: Three times a day (TID) | ORAL | Status: AC | PRN

## 2012-04-28 NOTE — Telephone Encounter (Signed)
I will send in this rx as patient has requested.  Please call pt and let her know.  Also remind her that this medicine is a "as needed" medication- not to be taken everyday or can cause more problems with headaches.

## 2012-04-28 NOTE — Telephone Encounter (Signed)
Note forwarded to Antionette Fairy-- thanks for your help with this patient.

## 2012-04-30 ENCOUNTER — Encounter: Payer: Self-pay | Admitting: Family Medicine

## 2012-04-30 ENCOUNTER — Telehealth: Payer: Self-pay | Admitting: Family Medicine

## 2012-04-30 NOTE — Telephone Encounter (Signed)
Request faxed to [facility] rec dept [phone]. For both GI and neuro.

## 2012-05-11 ENCOUNTER — Ambulatory Visit: Payer: Medicaid Other | Attending: Family Medicine | Admitting: Physical Therapy

## 2012-05-11 DIAGNOSIS — M6281 Muscle weakness (generalized): Secondary | ICD-10-CM | POA: Insufficient documentation

## 2012-05-11 DIAGNOSIS — IMO0001 Reserved for inherently not codable concepts without codable children: Secondary | ICD-10-CM | POA: Insufficient documentation

## 2012-05-11 DIAGNOSIS — R269 Unspecified abnormalities of gait and mobility: Secondary | ICD-10-CM | POA: Insufficient documentation

## 2012-05-26 ENCOUNTER — Ambulatory Visit: Payer: Medicaid Other | Admitting: Physical Therapy

## 2012-06-04 ENCOUNTER — Ambulatory Visit (INDEPENDENT_AMBULATORY_CARE_PROVIDER_SITE_OTHER): Payer: Self-pay | Admitting: Family Medicine

## 2012-06-04 ENCOUNTER — Encounter: Payer: Self-pay | Admitting: Family Medicine

## 2012-06-04 VITALS — BP 107/73 | HR 88 | Ht 68.0 in | Wt 181.0 lb

## 2012-06-04 DIAGNOSIS — F329 Major depressive disorder, single episode, unspecified: Secondary | ICD-10-CM

## 2012-06-04 DIAGNOSIS — M79609 Pain in unspecified limb: Secondary | ICD-10-CM

## 2012-06-04 DIAGNOSIS — R252 Cramp and spasm: Secondary | ICD-10-CM

## 2012-06-04 DIAGNOSIS — M79643 Pain in unspecified hand: Secondary | ICD-10-CM

## 2012-06-04 DIAGNOSIS — R29898 Other symptoms and signs involving the musculoskeletal system: Secondary | ICD-10-CM

## 2012-06-04 DIAGNOSIS — M6250 Muscle wasting and atrophy, not elsewhere classified, unspecified site: Secondary | ICD-10-CM

## 2012-06-04 MED ORDER — CYCLOBENZAPRINE HCL 10 MG PO TABS: 5.0000 mg | ORAL_TABLET | Freq: Three times a day (TID) | ORAL | Status: AC | PRN

## 2012-06-04 NOTE — Assessment & Plan Note (Signed)
Cause of chronic hand pain unclear.  Pt does report some numbness and tingling in hands along with sensation of cramping.  Could this be an atypical presentation of carpal tunnel?  Pt to continue voltaren.  Will refer to Sports medicine for further evaluation to see if cause of pain can be identified.

## 2012-06-04 NOTE — Progress Notes (Signed)
  Subjective:    Patient ID: Megan Kemp, female    DOB: 07-Apr-1961, 51 y.o.   MRN: [id number]  HPI Depression followup: Patient states that she is feeling her depression coming back. Has had increased stress in her life. Problems with her church family. And this has made her really angry and has hurt her feelings. This has caused her to have more depressed feelings. No SI. No HIV. No visual or auditory hallucinations. Did decrease Prozac to 20 mg a few weeks back. Thought she was feeling so much better she would try to reduce dose. Patient has not seen therapist recently due to some changes in administration at the clinic-her original therapist has moved to a different location. Has had a hard time getting rescheduled.  Hand pain: Has had pain in her hands x2 years. Constant but varies in intensity. Voltaren Use 3 times a day has helped pain significantly. But feels like she needs something else for pain control. Describes the pain her hands as a cramping in her fingers as well as her palm and back of her hand. Does sometimes have numbness in her fingers. No injuries to her hands. No joint redness. Patient states sometimes her hands feel swollen.  Leg pain: Patient has severe her calf cramping approximately 2 times a week at night. Sometimes been cramps in her calf lasted for a long time-hours during the night. During those times she has to get into a warm bath to help control cramping. This helps alleviate them some. Seems to be worse at night. The electrolytes were checked at last appointment and were within normal limits. No leg rash. No joint redness or swelling in legs.  Balance: Patient continues to walk with walker. I sent her to physical therapy to work on balance exercises to help her get independent from walker. Patient has gone to one appointment. Has 2 more to go. Insurance payer only cover 3 visits. Patient states the walker helps her feel more secure when she is ambulating. No falls.  No dizziness. No syncope.  Smoking status reviewed.   Review of Systems As per above    Objective:   Physical Exam  Constitutional: She appears well-developed and well-nourished.  HENT:  Head: Normocephalic and atraumatic.  Cardiovascular: Normal rate.   Pulmonary/Chest: Effort normal. No respiratory distress.  Musculoskeletal: She exhibits no edema.       Hand exam: No rash. No joint redness or swelling. Normal rom.  Normal strength + tenderness of palpation of hands and fingers bilateral Tinel's negative. No increase in pain with full flexion or full extension of wrist.   Leg exam: No rash. No joint redness or swelling.  Normal rom. Normal strength bilateral No pain on palpation of calf.       Neurological: She is alert.  Psychiatric: She has a normal mood and affect.       No SI or HI. No auditory or visual hallucinations.          Assessment & Plan:

## 2012-06-04 NOTE — Assessment & Plan Note (Addendum)
Pt to increase prozac to 40mg  as prescribed.  Pt to call and make a therapy appointment for  Asap.  Return if needed. Contracted for safety. Pt to return if new or worsening of symptoms.

## 2012-06-04 NOTE — Assessment & Plan Note (Signed)
Pt to continue PT in particular working on balance and gaining independence from walker.

## 2012-06-04 NOTE — Patient Instructions (Addendum)
Hand pain: Continue Voltaren.  I will send you to sports medicine clinic for a 2nd opinion about hand pain.  Leg cramping: Your electrolytes are all within normal limits. We can try a muscle relaxer for the night that you have severe repetitive cramps.   Depression: Increase prozac to previous dose of 40mg . Schedule therapy appointment.   Balance: Work hard with PT to learn exercises that you can continue at home and improve balance so that you can feel comfortable walking without the walker.  You need to do these exercises daily.   Return in 1 month for follow up.

## 2012-06-04 NOTE — Assessment & Plan Note (Signed)
Cause of leg cramps unclear.  Electrolytes wnl. Feel that this is most likely a benign but frustrating issue.  Will give flexeril for patient to use during the nights that she has recurrent leg cramping to see if this helps.

## 2012-06-08 ENCOUNTER — Ambulatory Visit: Payer: Self-pay | Attending: Family Medicine | Admitting: Physical Therapy

## 2012-06-08 DIAGNOSIS — IMO0001 Reserved for inherently not codable concepts without codable children: Secondary | ICD-10-CM | POA: Insufficient documentation

## 2012-06-08 DIAGNOSIS — M6281 Muscle weakness (generalized): Secondary | ICD-10-CM | POA: Insufficient documentation

## 2012-06-08 DIAGNOSIS — R269 Unspecified abnormalities of gait and mobility: Secondary | ICD-10-CM | POA: Insufficient documentation

## 2012-06-09 ENCOUNTER — Ambulatory Visit: Payer: Medicaid Other | Admitting: Physical Therapy

## 2012-06-10 ENCOUNTER — Other Ambulatory Visit: Payer: Self-pay | Admitting: Family Medicine

## 2012-06-10 ENCOUNTER — Ambulatory Visit (HOSPITAL_COMMUNITY)
Admission: RE | Admit: 2012-06-10 | Discharge: 2012-06-10 | Disposition: A | Discharge status: Home / Self Care | Payer: Self-pay | Source: Ambulatory Visit | Attending: Sports Medicine | Admitting: Sports Medicine

## 2012-06-10 ENCOUNTER — Encounter: Payer: Self-pay | Admitting: Sports Medicine

## 2012-06-10 ENCOUNTER — Ambulatory Visit (INDEPENDENT_AMBULATORY_CARE_PROVIDER_SITE_OTHER): Payer: Self-pay | Admitting: Sports Medicine

## 2012-06-10 VITALS — BP 117/81

## 2012-06-10 DIAGNOSIS — R200 Anesthesia of skin: Secondary | ICD-10-CM

## 2012-06-10 DIAGNOSIS — R209 Unspecified disturbances of skin sensation: Secondary | ICD-10-CM

## 2012-06-10 DIAGNOSIS — G5600 Carpal tunnel syndrome, unspecified upper limb: Secondary | ICD-10-CM

## 2012-06-10 DIAGNOSIS — G5603 Carpal tunnel syndrome, bilateral upper limbs: Secondary | ICD-10-CM | POA: Insufficient documentation

## 2012-06-10 MED ORDER — AMITRIPTYLINE HCL 25 MG PO TABS: 25.0000 mg | ORAL_TABLET | Freq: Every day | ORAL | Status: DC

## 2012-06-10 NOTE — Assessment & Plan Note (Signed)
U/s findings consistent with diagnosis of carpel tunnel.  Pt given bilateral braces. Also patient encouraged to work hard in PT to gain confidence in ability to walk and balance so that she can transition from walker to cane and relieve pressure on wrist joints.  Pt also started on amitriptyline to help neuropathic pain.  Pt to return to see PCP in 3-4 weeks for recheck.  Will also order c-spine x ray to evaluate for any degeneration that could be causing nerve damage resulting in these symptoms.

## 2012-06-10 NOTE — Patient Instructions (Addendum)
Go to [facility] for x-rays of neck.   Use carpel tunnel braces consistently.  Amitriptyline 25 mg at bedtime  Try to use cane instead of walker
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
## 2012-06-15 ENCOUNTER — Ambulatory Visit (INDEPENDENT_AMBULATORY_CARE_PROVIDER_SITE_OTHER): Payer: Self-pay | Admitting: Advanced Practice Midwife

## 2012-06-15 ENCOUNTER — Encounter: Payer: Self-pay | Admitting: Advanced Practice Midwife

## 2012-06-15 VITALS — BP 119/78 | HR 75 | Temp 97.3°F | Ht 67.0 in | Wt 183.3 lb

## 2012-06-15 DIAGNOSIS — N898 Other specified noninflammatory disorders of vagina: Secondary | ICD-10-CM

## 2012-06-15 DIAGNOSIS — N938 Other specified abnormal uterine and vaginal bleeding: Secondary | ICD-10-CM

## 2012-06-15 DIAGNOSIS — D259 Leiomyoma of uterus, unspecified: Secondary | ICD-10-CM

## 2012-06-15 DIAGNOSIS — R109 Unspecified abdominal pain: Secondary | ICD-10-CM

## 2012-06-15 DIAGNOSIS — N949 Unspecified condition associated with female genital organs and menstrual cycle: Secondary | ICD-10-CM

## 2012-06-15 DIAGNOSIS — Z124 Encounter for screening for malignant neoplasm of cervix: Secondary | ICD-10-CM

## 2012-06-15 LAB — WET PREP, GENITAL: Yeast Wet Prep HPF POC: NONE SEEN

## 2012-06-15 LAB — POCT URINALYSIS DIP (DEVICE)
Bilirubin Urine: NEGATIVE
Glucose, UA: NEGATIVE mg/dL
Hgb urine dipstick: NEGATIVE
Nitrite: NEGATIVE
Specific Gravity, Urine: 1.025 (ref 1.005–1.030)
pH: 6 (ref 5.0–8.0)

## 2012-06-15 MED ORDER — METRONIDAZOLE 500 MG PO TABS: 500.0000 mg | ORAL_TABLET | Freq: Two times a day (BID) | ORAL | Status: AC

## 2012-06-15 NOTE — Progress Notes (Signed)
  Subjective:    Patient ID: Megan Kemp, female    DOB: December 01, 1961, 51 y.o.   MRN: [id number]  HPI; Pt is here for malodorous discharge, generalized low abd pain, dysuria and chronic LLQ pain. The vaginal discharge started after taking PCN for dental infection. She initially experienced itching and burning which improved w/ Monistat. She is unsure if residual burning is vulvar or urethral. She is S/P partial ablation for DUB. Has only had scant spotting since then.   Review of Systems  Constitutional: Negative for fever, chills and unexpected weight change.  Gastrointestinal: Positive for abdominal pain. Negative for nausea, vomiting, diarrhea, constipation, blood in stool, abdominal distention and rectal pain.  Genitourinary: Positive for dysuria, vaginal bleeding (spotted 2 days this month) and vaginal discharge. Negative for urgency, frequency, hematuria, flank pain, difficulty urinating, genital sores and vaginal pain.  Musculoskeletal: Negative for back pain.      Objective:      Physical Exam  Nursing note and vitals reviewed. Constitutional: She is oriented to person, place, and time. She appears well-developed and well-nourished. No distress.  Eyes: Pupils are equal, round, and reactive to light.  Cardiovascular: Normal rate.   Pulmonary/Chest: Effort normal.  Abdominal: Soft. Bowel sounds are normal. She exhibits no distension and no mass. There is tenderness (mild, diffuse low abd tenderness). There is no rebound and no guarding.  Genitourinary: There is no lesion on the right labia. There is no lesion on the left labia. Uterus is enlarged. Uterus is not tender. Cervix exhibits no motion tenderness, no discharge and no friability. Right adnexum displays no mass, no tenderness and no fullness. Left adnexum displays no mass, no tenderness and no fullness. No erythema or bleeding around the vagina. Vaginal discharge (small amount of creamy, white malodorous discharge) found.    Lymphadenopathy:       Right: No inguinal adenopathy present.       Left: No inguinal adenopathy present.  Neurological: She is alert and oriented to person, place, and time.  Skin: Skin is warm and dry.  Psychiatric: She has a normal mood and affect.   BP 119/78  Pulse 75  Temp 97.3 F (36.3 C) (Oral)  Ht 5\' 7"  (1.702 m)  Wt 83.144 kg (183 lb 4.8 oz)  BMI 28.71 kg/m2  LMP 05/06/2011    Assessment & Plan:   1. Vaginal Discharge  Wet prep, genital, metroNIDAZOLE (FLAGYL) 500 MG tablet  2. Screening for malignant neoplasm of cervix  Cytology - PAP  3. Abdominal pain  Urine Culture, metroNIDAZOLE (FLAGYL) 500 MG tablet  4. DUB (dysfunctional uterine bleeding)    5. Fibroid uterus    F/U w/ PCP if no improvement.  [place], CNM 06/15/2012 4:55 PM

## 2012-06-15 NOTE — Patient Instructions (Addendum)
Urinaysis, Urine Culture, Wet Prep, Pap Smear and Gonorrhea and Chlamydia Cultures were done today. Follow-up with your primary care provider for further evaluation of left abdominal pain. We have not been able to identify a gynecologic cause of that pain.    Bacterial Vaginosis Bacterial vaginosis (BV) is a vaginal infection where the normal balance of bacteria in the vagina is disrupted. The normal balance is then replaced by an overgrowth of certain bacteria. There are several different kinds of bacteria that can cause BV. BV is the most common vaginal infection in women of childbearing age. CAUSES   The cause of BV is not fully understood. BV develops when there is an increase or imbalance of harmful bacteria.   Some activities or behaviors can upset the normal balance of bacteria in the vagina and put women at increased risk including:   Having a new sex partner or multiple sex partners.   Douching.   Using an intrauterine device (IUD) for contraception.   It is not clear what role sexual activity plays in the development of BV. However, women that have never had sexual intercourse are rarely infected with BV.  Women do not get BV from toilet seats, bedding, swimming pools or from touching objects around them.  SYMPTOMS   Grey vaginal discharge.   A fish-like odor with discharge, especially after sexual intercourse.   Itching or burning of the vagina and vulva.   Burning or pain with urination.   Some women have no signs or symptoms at all.  DIAGNOSIS  Your caregiver must examine the vagina for signs of BV. Your caregiver will perform lab tests and look at the sample of vaginal fluid through a microscope. They will look for bacteria and abnormal cells (clue cells), a pH test higher than 4.5, and a positive amine test all associated with BV.  RISKS AND COMPLICATIONS   Pelvic inflammatory disease (PID).   Infections following gynecology surgery.   Developing HIV.    Developing herpes virus.  TREATMENT  Sometimes BV will clear up without treatment. However, all women with symptoms of BV should be treated to avoid complications, especially if gynecology surgery is planned. Female partners generally do not need to be treated. However, BV may spread between female sex partners so treatment is helpful in preventing a recurrence of BV.   BV may be treated with antibiotics. The antibiotics come in either pill or vaginal cream forms. Either can be used with nonpregnant or pregnant women, but the recommended dosages differ. These antibiotics are not harmful to the baby.   BV can recur after treatment. If this happens, a second round of antibiotics will often be prescribed.   Treatment is important for pregnant women. If not treated, BV can cause a premature delivery, especially for a pregnant woman who had a premature birth in the past. All pregnant women who have symptoms of BV should be checked and treated.   For chronic reoccurrence of BV, treatment with a type of prescribed gel vaginally twice a week is helpful.  HOME CARE INSTRUCTIONS   Finish all medication as directed by your caregiver.   Do not have sex until treatment is completed.   Tell your sexual partner that you have a vaginal infection. They should see their caregiver and be treated if they have problems, such as a mild rash or itching.   Practice safe sex. Use condoms. Only have 1 sex partner.  PREVENTION  Basic prevention steps can help reduce the risk of upsetting  the natural balance of bacteria in the vagina and developing BV:  Do not have sexual intercourse (be abstinent).   Do not douche.   Use all of the medicine prescribed for treatment of BV, even if the signs and symptoms go away.   Tell your sex partner if you have BV. That way, they can be treated, if needed, to prevent reoccurrence.  SEEK MEDICAL CARE IF:   Your symptoms are not improving after 3 days of treatment.   You  have increased discharge, pain, or fever.  MAKE SURE YOU:   Understand these instructions.   Will watch your condition.   Will get help right away if you are not doing well or get worse.  FOR MORE INFORMATION  Division of STD Prevention (DSTDP), Centers for Disease Control and Prevention: [employer] American Social Health Association (ASHA): www.ashastd.org  Document Released: 12/09/2005 Document Revised: 11/28/2011 Document Reviewed: 06/01/2009 [facility] Patient Information 2012 [place], [place].

## 2012-06-18 ENCOUNTER — Telehealth: Payer: Self-pay | Admitting: Family Medicine

## 2012-06-18 DIAGNOSIS — D259 Leiomyoma of uterus, unspecified: Secondary | ICD-10-CM | POA: Insufficient documentation

## 2012-06-18 NOTE — Telephone Encounter (Signed)
Patient went to the [facility] and she was told to have her MD write an order for an xray of her Upper Intestines.  She was prescribed Flagyl.  She wasn't able to be very clear about exactly what it is she needs.  She has an appt on 7/16 but wants to go ahead and have this test done before that appt.

## 2012-06-19 NOTE — Telephone Encounter (Signed)
I just reviewed the note from the [facility]- It did not mention anything about "xray of her upper intestines."  It did say that they could not find a gynecologic cause for her abdominal pain and that if it continues that she should come in for evaluation.  If this has continued and hasn't improved she will need to be seen in clinic for further workup of her abdominal pain and we can decide at that time if any x-rays are needed. Please call patient and give this message.

## 2012-06-23 ENCOUNTER — Ambulatory Visit: Payer: Self-pay | Attending: Family Medicine | Admitting: Physical Therapy

## 2012-06-23 DIAGNOSIS — M6281 Muscle weakness (generalized): Secondary | ICD-10-CM | POA: Insufficient documentation

## 2012-06-23 DIAGNOSIS — IMO0001 Reserved for inherently not codable concepts without codable children: Secondary | ICD-10-CM | POA: Insufficient documentation

## 2012-06-23 DIAGNOSIS — R269 Unspecified abnormalities of gait and mobility: Secondary | ICD-10-CM | POA: Insufficient documentation
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
## 2012-06-30 ENCOUNTER — Ambulatory Visit: Payer: Medicaid Other | Admitting: Physical Therapy

## 2012-07-06 ENCOUNTER — Telehealth: Payer: Self-pay | Admitting: General Practice

## 2012-07-06 NOTE — Telephone Encounter (Signed)
Called Megan Kemp and informed her of the following test results;  Pap normal, urine culture negative for infection, wet prep showed BV and she had been  given the correct medication for this problem.  Megan Kemp states that she had stopped taking the medication because she had been given a different medication from a doctor for her hand and her stomach was getting upset.  I told Megan Kemp that if she was no longer having the vaginal d/c and odor, she may not need the medication- especially if it upset her stomach.  It is up to her whether or not she wants to take the rest of it.  Megan Kemp also mentioned that she was told she needed x-rays of her stomach.  I told Megan Kemp that she has appt tomorrow for evaluation of her problem and any tests which may need to be ordered will be determined by the doctor at [facility].  Megan Kemp voiced understanding of all information.

## 2012-07-06 NOTE — Telephone Encounter (Signed)
Patient called stating she had seen [place] lately and wanted her test results from that visit and would like a call back

## 2012-07-07 ENCOUNTER — Ambulatory Visit (INDEPENDENT_AMBULATORY_CARE_PROVIDER_SITE_OTHER): Payer: Self-pay | Admitting: Family Medicine

## 2012-07-07 VITALS — BP 127/82 | HR 76 | Ht 67.0 in | Wt 180.0 lb

## 2012-07-07 DIAGNOSIS — G43909 Migraine, unspecified, not intractable, without status migrainosus: Secondary | ICD-10-CM

## 2012-07-07 DIAGNOSIS — G5600 Carpal tunnel syndrome, unspecified upper limb: Secondary | ICD-10-CM

## 2012-07-07 DIAGNOSIS — G5603 Carpal tunnel syndrome, bilateral upper limbs: Secondary | ICD-10-CM

## 2012-07-07 DIAGNOSIS — R109 Unspecified abdominal pain: Secondary | ICD-10-CM

## 2012-07-07 MED ORDER — AMITRIPTYLINE HCL 50 MG PO TABS: 50.0000 mg | ORAL_TABLET | Freq: Every day | ORAL | Status: DC

## 2012-07-07 NOTE — Progress Notes (Signed)
  Subjective:    Patient ID: Megan Kemp, female    DOB: [birth year], 51 y.o.   MRN: [id number]  HPI Carpal tunnel followup: Patient has been using bilateral carpal tunnel braces as directed. Also no longer using walker-has been using walking sticks bilateral. Has had a decrease in her pain in her hands overall. Still some numbness and tingling. Using full care and as needed. Doing well with amitriptyline at bedtime. Seems to be helping some with the pain.  Abdominal pain: Patient states that she has had left lower quadrant pain x1 month. Seen at [facility] for vaginal discharge and lower abdominal pain diagnosed with bacterial vaginosis. Was treated for this. Patient has continued to have lower abdominal pain-vaginal discharge has now resolved. Patient reports long history of constipation. Has to strain with bowel movements. Usually has one small bowel movement per day but is very hard and comes out in small balls. Sometimes when she wipes will have blood on tissue. No anal pain. No dark stools. No bloody stools. Sometimes we drink Epsom salts to help soften stool. Patient has family history of diverticulitis. Patient does not have a personal history of this.  Migraine: Patient states that migraines seem well-controlled this time. Stable since last exam.  Did stop taking Depakote with abdominal pain-felt that she was taking too many medications so she decided to stop Depakote. Was worried that this was contributing to abdominal pain. But abdominal pain continued even with stopping Depakote. Patient states she plans to restart Depakote. Unable to obtain lab work-Depakote level, liver enzymes, CBC since patient is not currently compliant with medication.   Smoking status reviewed.    Review of Systems As per above.    Objective:   Physical Exam Physical Exam  Constitutional: She appears well-developed and well-nourished.  HENT:  Head: Normocephalic and atraumatic.    Cardiovascular: Normal rate.  Pulmonary/Chest: Effort normal. No respiratory distress.  Musculoskeletal: She exhibits no edema.  Abd: soft, NT, ND, mild tenderness in left lower quad, no rebound, no guarding.  Hand exam: No rash. No joint redness or swelling. Normal rom.  Normal strength + tenderness of palpation of hands and fingers bilateral Tinel's negative. No increase in pain with full flexion or full extension of wrist.           Assessment & Plan:

## 2012-07-07 NOTE — Patient Instructions (Addendum)
Carpel tunnel: Continue using braces and walking stick. Increase amitriptyline 50mg  daily.   Abdominal pain: Take miralax. (you can ask the pharmacist for the store brand).  Take it everyday at least once- Increase up to 3 x per day until your stool is soft.  Return if no improvement.   Return in 2-3 weeks for recheck or sooner if new or worsening of symptoms.

## 2012-07-09 DIAGNOSIS — R109 Unspecified abdominal pain: Secondary | ICD-10-CM | POA: Insufficient documentation

## 2012-07-09 NOTE — Assessment & Plan Note (Signed)
Pt to restart depakote since it did give relief of migraine headaches and possible secondary effect of improvement in mood disorder.  Pt to restart depakote and at next appt will do monitoring labs including depakote level.

## 2012-07-09 NOTE — Assessment & Plan Note (Signed)
Pt improved with current therapy.  Pt to continue using carpel tunnel brace and use walking sticks.  Will increase amitriptyline to 50mg  po at bedtime.  Pt to return for f/up in 32month.

## 2012-07-09 NOTE — Assessment & Plan Note (Addendum)
abd pain most likely secondary to constipation (chronic)-- no red flags in history or physical exam- blood on tissue with wiping most likely hemmorrhoids but pt refusing anoscopic exam today.  Will start patient on bowel regimen of miralax once daily and titrate up to 3 x day as needed.  See pt instructions.  Pt to return in 2-3 weeks for follow up with new pcp.

## 2012-07-13 ENCOUNTER — Telehealth: Payer: Self-pay | Admitting: Family Medicine

## 2012-07-13 NOTE — Telephone Encounter (Addendum)
Patients [facility] need referral and auth for patient to have an MRI of her cervix and uterus. They saw a mass from the CT scan.  Megan Kemp is asking to have this sent asap.  You can call Doctor Elonda Husky at [phone].  Call patient when this has been done.

## 2012-07-13 NOTE — Telephone Encounter (Signed)
The number that I called with the emergency department- I am assuming that Dr. Elonda Husky is an ER doctor?  I have not seen this MRI- please request MRI results for my review.  Once I have read the results I will able to order the needed follow up imaging.  I can not legally do this until I have reviewed the results. Britta Mccreedy, can you please call pt and give her this message?

## 2012-08-07 ENCOUNTER — Encounter: Payer: Self-pay | Admitting: Family Medicine

## 2012-08-07 ENCOUNTER — Ambulatory Visit (INDEPENDENT_AMBULATORY_CARE_PROVIDER_SITE_OTHER): Payer: Self-pay | Admitting: Family Medicine

## 2012-08-07 VITALS — BP 110/70 | HR 72 | Ht 67.0 in | Wt 179.0 lb

## 2012-08-07 DIAGNOSIS — G5600 Carpal tunnel syndrome, unspecified upper limb: Secondary | ICD-10-CM

## 2012-08-07 DIAGNOSIS — D259 Leiomyoma of uterus, unspecified: Secondary | ICD-10-CM

## 2012-08-07 DIAGNOSIS — G5603 Carpal tunnel syndrome, bilateral upper limbs: Secondary | ICD-10-CM

## 2012-08-07 NOTE — Assessment & Plan Note (Signed)
Patient being followed by Dr. Wille Celeste and [facility]. She has had a biopsy and has a follow up 09/02/12. She received prescriptions for zofran and ibuprofen last night, so she does not need Rx.

## 2012-08-07 NOTE — Progress Notes (Signed)
  Subjective:    Patient ID: Megan Kemp, female    DOB: 07-25-1961, 51 y.o.   MRN: [id number]  HPI  51 year F with chronic lower abdominal pain presumed 2/2 to uterine fibroids, depression, and carpal tunnel. The patient presents to day as an initial visit to meet with me as her new PCP. She was previously cared for by Dr. Edmonia James. She went to the ED at [facility] last night for abdominal pain. She says they gave her ibuprofen and performed a pelvic exam. She does not have any results of the labs. Her pain is improved from an 10/10 last night to an 8/10 this morning. She denies any vaginal bleeding. She also notes minimal nausea but no vomiting. She denies diarrhea.   Review of Systems  Musculoskeletal:       Hand cramping  All other systems reviewed and are negative.       Objective:   Physical Exam  Constitutional: She appears well-developed and well-nourished. She appears distressed.  HENT:  Head: Normocephalic and atraumatic.  Cardiovascular: Normal rate and regular rhythm.   Abdominal: Normal appearance and bowel sounds are normal. There is tenderness in the right lower quadrant and suprapubic area. There is no rigidity, no guarding, no tenderness at McBurney's point and negative Murphy's sign.  Musculoskeletal:       Negative tinel's sign    BP 110/70  Pulse 72  Ht 5\' 7"  (1.702 m)  Wt 179 lb (81.194 kg)  BMI 28.04 kg/m2  LMP 05/06/2011       Assessment & Plan:  51 year old F presenting with chronic stable abdominal pain secondary to uterine leiomyoma.

## 2012-08-07 NOTE — Assessment & Plan Note (Signed)
Continuing carpal tunnel brace and amitriptyline. Patient does not want injection or surgery, so continue conservative measures.

## 2012-08-25 ENCOUNTER — Telehealth: Payer: Self-pay | Admitting: Family Medicine

## 2012-08-25 NOTE — Telephone Encounter (Signed)
Patient dropped off handicapped placard form to be filled out.  Please call when completed. °

## 2012-08-26 NOTE — Telephone Encounter (Signed)
Please tell Megan Kemp that I need to evaluate her in the office and discuss the form. Thank you.

## 2012-09-09 ENCOUNTER — Ambulatory Visit (INDEPENDENT_AMBULATORY_CARE_PROVIDER_SITE_OTHER): Payer: Self-pay | Admitting: Family Medicine

## 2012-09-09 ENCOUNTER — Encounter: Payer: Self-pay | Admitting: Family Medicine

## 2012-09-09 VITALS — BP 116/73 | HR 76 | Temp 98.1°F | Ht 68.0 in | Wt 181.1 lb

## 2012-09-09 DIAGNOSIS — R29898 Other symptoms and signs involving the musculoskeletal system: Secondary | ICD-10-CM

## 2012-09-09 DIAGNOSIS — F329 Major depressive disorder, single episode, unspecified: Secondary | ICD-10-CM

## 2012-09-09 DIAGNOSIS — D259 Leiomyoma of uterus, unspecified: Secondary | ICD-10-CM

## 2012-09-09 DIAGNOSIS — M6250 Muscle wasting and atrophy, not elsewhere classified, unspecified site: Secondary | ICD-10-CM

## 2012-09-09 DIAGNOSIS — G8929 Other chronic pain: Secondary | ICD-10-CM

## 2012-09-09 DIAGNOSIS — R52 Pain, unspecified: Secondary | ICD-10-CM

## 2012-09-11 DIAGNOSIS — G8929 Other chronic pain: Secondary | ICD-10-CM | POA: Insufficient documentation

## 2012-09-11 DIAGNOSIS — R52 Pain, unspecified: Secondary | ICD-10-CM | POA: Insufficient documentation

## 2012-09-11 MED ORDER — IBUPROFEN 800 MG PO TABS: 800.0000 mg | ORAL_TABLET | Freq: Three times a day (TID) | ORAL | Status: DC | PRN

## 2012-09-11 MED ORDER — AMITRIPTYLINE HCL 50 MG PO TABS: 50.0000 mg | ORAL_TABLET | Freq: Every day | ORAL | Status: DC

## 2012-09-11 NOTE — Assessment & Plan Note (Signed)
The patient has refractory depression, but did not want any adjustments to medications. She wants a Veterinary surgeon or therapist that she can speak with. She has met with Dr. Pascal Lux before and does not feel like they are a good match. She knows of a therapy group in town who is willing to take her on. I encouraged her to take Amitriptyline daily to try and decrease her pain, since her pain is preventing her from being active, which perpetuates her depression. She was agreeable to this.

## 2012-09-11 NOTE — Assessment & Plan Note (Signed)
It is unclear to me why she is so deconditioned and having generalized pain. She requested a handicap parking sticker.

## 2012-09-11 NOTE — Assessment & Plan Note (Signed)
Patient notes that she is going to the OB-GYN department at [facility] for a pre-surgical consultation on October 2nd.

## 2012-09-11 NOTE — Telephone Encounter (Signed)
Ms. Troop notified Handicap Placard application is ready to be picked up at front desk.  Ileana Ladd

## 2012-09-11 NOTE — Assessment & Plan Note (Signed)
It is unclear to me why the patient has such extensive and debilitating pain, but it is clearly linked to her depression. She has previously been evaluated by [facility] as well as [facility] according to notes from her previous PCP. Dr. Edmonia James clearly felt that this was neuropathic as she prescribed amitriptyline. However, since the patient has not taken the medication, it is difficult to know it's efficacy. She is agreeable to taking the medication now. If it does not improve, then I will consider getting inflammatory markers. I also need to get records from [facility].

## 2012-09-11 NOTE — Progress Notes (Signed)
  Subjective:    Patient ID: Megan Kemp, female    DOB: September 04, 1961, 51 y.o.   MRN: [id number]  HPI  51 year old F with depression and chronic pain who presents with depression and diffuse pain in her extremities. She notes that she is very depressed today. Her depression appears multifactorial, related to grief from her mother's death and social stressors including broken relationships with sisters, her son, and wanting to spend more time with her grandchildren. She also feels burdened by her boyfriend who is dealing with his mother under Hospice care. She feels that at 51 years old, she is too old deal with other people's problems and wants more time to herself to do things that she likes. She would like to spend more time with her grandchildren, but cannot due to them all having different mothers. Also, she would like to volunteer at a nursing home to spend time with elderly people who do not have any family. She is stressed that she cannot work, but feels that she is in too much physical pain to work. Her pain is throughout her legs, arms, and hands. She acknowledges that the pain is very bad today. She also acknowledges that the pain is worse when she is very depressed. The pain in her hands causes her to wear braces for presumed carpal tunnel. She notes that she has been offered surgery, and repeatedly says, "I'm 51 years old. I can't deal with all that." She also walks with walking sticks to support herself 2/2 to leg pain. Currently, the medications that she is taking to deal with depression include depakote 500 mg BID and Prozac 20 mg BID. She does not take amitriptyline which was prescribed to her by Dr. Edmonia James. She cannot articulate why she does not take it, but alludes to not thinking it will work. She also take ibuprofen 800 mg for abdominal pain that she attributes to uterine fibroids. This pain often causes her to go to the ED at [facility], and she most recently went on 09/02/12, where she  was evaluated and discharged. Megan Kemp is tearful throughout the exam.    Review of Systems Positive for hand pain, bilateral arm pain, bilateral leg pain, depression, abdominal pain Negative for nausea, vomiting, diarrhea, chest pain, shortness of breath, SI    Objective:   Physical Exam  BP 116/73  Pulse 76  Temp 98.1 F (36.7 C) (Oral)  Ht 5\' 8"  (1.727 m)  Wt 181 lb 2 oz (82.158 kg)  BMI 27.54 kg/m2  LMP 05/06/2011 Psych: appears very depressed, emotionally labile, tearful, talkative and difficult to redirect     Assessment & Plan:  30 minutes were spent in face-to-face time with the patient.

## 2012-09-28 ENCOUNTER — Encounter: Payer: Self-pay | Admitting: Family Medicine

## 2012-09-28 ENCOUNTER — Ambulatory Visit (INDEPENDENT_AMBULATORY_CARE_PROVIDER_SITE_OTHER): Payer: Self-pay | Admitting: Family Medicine

## 2012-09-28 VITALS — BP 114/81 | HR 72 | Ht 68.0 in | Wt 185.0 lb

## 2012-09-28 DIAGNOSIS — D259 Leiomyoma of uterus, unspecified: Secondary | ICD-10-CM

## 2012-09-28 DIAGNOSIS — R52 Pain, unspecified: Secondary | ICD-10-CM

## 2012-09-28 NOTE — Progress Notes (Signed)
  Subjective:    Patient ID: Megan Kemp, female    DOB: [birth year], 51 y.o.   MRN: [id number]  HPI  51 year F with fibroid uterus, chronic pain of unknown etiology and uncontrolled depression.   Generalized Pain - At her previous visit on 9/18, we talked about depression extensively and the association to her pain. She agreed to start taking elavil at her previous visit, but has not been taking it. She claims that her OB-GYN does not want her taking this medication when she goes for a biopsy on October. The patient does not produce any notes with this information. She denies cramping and pain in her legs today. She thinks that the pain relief is related to hydration, vitamin C, and milk. She also denies depression today, and states that her mood is much improved.    Uterine Fibroid - Goes to OB-GYN at [facility] and says she is scheduled for an upcoming biopsy on 10/14/12. No records.   Dysphagia - Evaluated by GI at [facility]. Has upcoming appointment for swallowing study. No records.   Review of Systems Negative unless stated above    Objective:   Physical Exam BP 114/81  Pulse 72  Ht 5\' 8"  (1.727 m)  Wt 185 lb (83.915 kg)  BMI 28.13 kg/m2  LMP 05/06/2011 Gen: alert, oriented x 4, pleasant and conversant, normal body habitus Psych: normal affect, no emotional lability     Assessment & Plan:  Patient counseled for > 50% of visit about taking medication that may help with nerve pain and depression at the same time.

## 2012-09-30 NOTE — Assessment & Plan Note (Signed)
This is markedly improved along with her mood today, furthering my suspicion that this pain and her depression and directly related. The patient has not been taking Elavil despite it being prescribed in June and reinforced by me at our previous visit. Unfortunately, the patient has very low health literacy and can only provide vague details about her OB-GYN plans. So it is unclear to me why she would have been instructed no to take the medication by her OB-GYN physician. Regardless, she is agreeable to starting this medication after Oct 23rd. I will also consider a work up for inflammatory illnesses if this persists.

## 2012-09-30 NOTE — Assessment & Plan Note (Signed)
Obtain records from [facility].

## 2012-09-30 NOTE — Assessment & Plan Note (Signed)
Obtain records from [facility].

## 2012-11-12 ENCOUNTER — Other Ambulatory Visit: Payer: Self-pay | Admitting: Family Medicine

## 2012-11-12 DIAGNOSIS — Z1231 Encounter for screening mammogram for malignant neoplasm of breast: Secondary | ICD-10-CM

## 2012-12-02 ENCOUNTER — Ambulatory Visit (HOSPITAL_COMMUNITY)
Admission: RE | Admit: 2012-12-02 | Discharge: 2012-12-02 | Disposition: A | Discharge status: Home / Self Care | Payer: Self-pay | Source: Ambulatory Visit | Attending: Family Medicine | Admitting: Family Medicine

## 2012-12-02 DIAGNOSIS — Z1231 Encounter for screening mammogram for malignant neoplasm of breast: Secondary | ICD-10-CM | POA: Insufficient documentation

## 2012-12-04 ENCOUNTER — Encounter: Payer: Self-pay | Admitting: Family Medicine

## 2012-12-04 DIAGNOSIS — N857 Hematometra: Secondary | ICD-10-CM | POA: Insufficient documentation

## 2013-04-12 ENCOUNTER — Encounter: Payer: Self-pay | Admitting: Family Medicine

## 2013-04-12 ENCOUNTER — Ambulatory Visit (INDEPENDENT_AMBULATORY_CARE_PROVIDER_SITE_OTHER): Payer: Medicare Other | Admitting: Family Medicine

## 2013-04-12 VITALS — BP 119/78 | HR 74 | Ht 68.0 in | Wt 172.0 lb

## 2013-04-12 DIAGNOSIS — R21 Rash and other nonspecific skin eruption: Secondary | ICD-10-CM

## 2013-04-12 DIAGNOSIS — R52 Pain, unspecified: Secondary | ICD-10-CM

## 2013-04-12 DIAGNOSIS — L259 Unspecified contact dermatitis, unspecified cause: Secondary | ICD-10-CM

## 2013-04-12 DIAGNOSIS — M7662 Achilles tendinitis, left leg: Secondary | ICD-10-CM

## 2013-04-12 DIAGNOSIS — M7660 Achilles tendinitis, unspecified leg: Secondary | ICD-10-CM

## 2013-04-12 DIAGNOSIS — L309 Dermatitis, unspecified: Secondary | ICD-10-CM

## 2013-04-12 MED ORDER — TRIAMCINOLONE ACETONIDE 0.1 % EX CREA: TOPICAL_CREAM | Freq: Two times a day (BID) | CUTANEOUS | Status: DC

## 2013-04-12 NOTE — Patient Instructions (Signed)
Dear Ms. Megan Kemp,   I am very glad that you were able to get your hysterectomy. This will help with decreasing your pain and bleeding.   1. Rash on left arms - I think that this is a form of eczema. Let's try a steroid cream for 2 weeks and see it is improved.   2. Left heel pain - I believe that this is tendonitis. It should get better with stretching 3 times and days and icing twice a day.   Please follow up in 4 weeks.

## 2013-04-12 NOTE — Progress Notes (Signed)
  Subjective:    Patient ID: Megan Kemp, female    DOB: Apr 04, 1961, 52 y.o.   MRN: [id number]  HPI  52 year old F with recent abdominal hysterectomy and BSO for chronic pelvic pain.   1. Itching rash on left forearm. Started recently. Few small bumps. Trying calamine lotion without improvement. Patient bathes twice a day.   2. Left Heel Pain - 2 months duration, tenderness, non swollen, denies falls or known injury, hurts in the morning   3. Abdominal Pain  - Located in lower abdomen, improving after hysterectomy but still present, requesting opioid medication for pain   Review of Systems Hot flashes     Objective:   Physical Exam BP 119/78  Pulse 74  Ht 5\' 8"  (1.727 m)  Wt 172 lb (78.019 kg)  BMI 26.16 kg/m2  LMP 05/06/2011 Gen: alert and oriented x 4, well appearing, very talktative Left forearm - 5-6 flesh colored papules without drainage or indentation, no surrounding erythema or induration  Abd: well healing transverse lower abdominal scar, non distended, mildly tender  Left Heel - no swelling or erythema, tender to palpation at insertion of achilles to calcaneus, normal plantar and dorsal flexion      Assessment & Plan:

## 2013-04-14 DIAGNOSIS — R21 Rash and other nonspecific skin eruption: Secondary | ICD-10-CM | POA: Insufficient documentation

## 2013-04-14 DIAGNOSIS — M79672 Pain in left foot: Secondary | ICD-10-CM | POA: Insufficient documentation

## 2013-04-14 NOTE — Assessment & Plan Note (Signed)
Patient still complaining of diffuse pain with cramping. Not taking amitriptyline as prescribed > 6 months ago. Request for opioids refused. No active intervention necessary at this time.

## 2013-04-14 NOTE — Assessment & Plan Note (Signed)
Concern for eczema, start steroid cream. DDX include molluscum vs contact dermatitis.

## 2013-04-14 NOTE — Assessment & Plan Note (Signed)
Given instructions to stretch TID and ice BID. If not improved in 4 weeks consider nitro patches.

## 2013-04-19 ENCOUNTER — Encounter (HOSPITAL_COMMUNITY): Payer: Self-pay | Admitting: Emergency Medicine

## 2013-04-19 ENCOUNTER — Telehealth (HOSPITAL_COMMUNITY): Payer: Self-pay | Admitting: Emergency Medicine

## 2013-04-19 ENCOUNTER — Emergency Department (INDEPENDENT_AMBULATORY_CARE_PROVIDER_SITE_OTHER)
Admission: EM | Admit: 2013-04-19 | Discharge: 2013-04-19 | Disposition: A | Discharge status: Home / Self Care | Payer: Medicare Other | Source: Home / Self Care | Attending: Emergency Medicine | Admitting: Emergency Medicine

## 2013-04-19 DIAGNOSIS — H60399 Other infective otitis externa, unspecified ear: Secondary | ICD-10-CM

## 2013-04-19 DIAGNOSIS — H60391 Other infective otitis externa, right ear: Secondary | ICD-10-CM

## 2013-04-19 MED ORDER — CIPROFLOXACIN HCL 0.2 % OT SOLN: 0.2000 mL | Freq: Two times a day (BID) | OTIC | Status: AC

## 2013-04-19 MED ORDER — CARBAMIDE PEROXIDE 6.5 % OT SOLN: 5.0000 [drp] | Freq: Every evening | OTIC | Status: AC | PRN

## 2013-04-19 NOTE — ED Notes (Signed)
Right ear pain for one week, has used sweet oil in ear.  Denies cold symptoms.  Patient feels like something in ear.  Patient has used q tips
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
## 2013-04-19 NOTE — ED Notes (Signed)
Insurance will not cover 0.2% Cipro but they will cover 0.3%.  Dr Ladon Applebaum reviewed chart and it was changed to 0.3% Cipro.  2 drops 2 times a day

## 2013-05-12 ENCOUNTER — Ambulatory Visit: Payer: Medicare Other | Admitting: Family Medicine

## 2013-05-21 ENCOUNTER — Ambulatory Visit (INDEPENDENT_AMBULATORY_CARE_PROVIDER_SITE_OTHER): Payer: Medicare Other | Admitting: Family Medicine

## 2013-05-21 ENCOUNTER — Encounter: Payer: Self-pay | Admitting: Family Medicine

## 2013-05-21 VITALS — BP 115/80 | HR 70 | Ht 68.0 in | Wt 167.8 lb

## 2013-05-21 DIAGNOSIS — G5600 Carpal tunnel syndrome, unspecified upper limb: Secondary | ICD-10-CM

## 2013-05-21 DIAGNOSIS — M7660 Achilles tendinitis, unspecified leg: Secondary | ICD-10-CM

## 2013-05-21 DIAGNOSIS — G5603 Carpal tunnel syndrome, bilateral upper limbs: Secondary | ICD-10-CM

## 2013-05-21 DIAGNOSIS — M7662 Achilles tendinitis, left leg: Secondary | ICD-10-CM

## 2013-05-21 MED ORDER — GABAPENTIN 300 MG PO CAPS: 300.0000 mg | ORAL_CAPSULE | Freq: Three times a day (TID) | ORAL | Status: DC

## 2013-05-21 MED ORDER — NITROGLYCERIN 0.2 MG/HR TD PT24: MEDICATED_PATCH | TRANSDERMAL | Status: DC

## 2013-05-21 NOTE — Patient Instructions (Signed)
Dear Ms. [name],   I am glad that you are doing so well.   For the heel, continue stretching and icing. The heel cup is also a good idea. Also, please start using the nitroglycerin patches. Cut one patch into quarters and place a small patch on the heel each day. Be sure to remove the old patch.   For carpal tunnel pain, we will start gabapentin 300 mg each day. Please start taking it at night, because it can make you sleepy at first. If this is not effective, then we can go up on the dose.   Please return in 4 weeks.   Dr. Clinton Sawyer

## 2013-05-21 NOTE — Progress Notes (Signed)
  Subjective:    Patient ID: Megan Kemp, female    DOB: 03/20/1961, 52 y.o.   MRN: [id number]  HPI  52 year old F with left heel pain and carpal tunnel who presents for follow up.    Pain Follow Up  Location: left heel Symptoms: stabbing pain Source: presumed achilles tendonitis Current Treatment/Recent Course: icing and stretching daily since last visit 5 weeks ago Character 8/10 Analgesia 6/10 Activity Increased: No - more difficulty with walking especially in the morning Aberant Behavior: No Adverse Reaction: No Recent Imaging: No   Carpal Tunnel Syndrome  - Bilateral, present for several years and previously seen by Dr. Edmonia James and Dr. Darrick Penna for the problem. Treatment included braces and voltaren gel, both of which she still uses. Also, she was prescribed amitriptyline but never started it. She has previously refused injections and surgery referral, which she continues to refuse today. Her most bothersome symptoms is cramping.     Review of Systems Depression improving     Objective:   Physical Exam  BP 115/80  Pulse 70  Ht 5\' 8"  (1.727 m)  Wt 167 lb 12.8 oz (76.114 kg)  BMI 25.52 kg/m2  LMP 05/06/2011  Gen: middle aged AAF, non distressed, well appearing Hands: normal grip strength and muscle tone, negative tinnel's sign bilaterally Left Heel: normal ROM, tenderness to palpation of distal insertion of achillles tendon, pain with passive and active dorsiflexion but not plantar flexion  Psych: normal affect, thought content and speech pattern      Assessment & Plan:

## 2013-05-21 NOTE — Assessment & Plan Note (Addendum)
Continue wearing braces and using voltaren gel. Since patient not agreeable to injections, I will use gabapentin to see if it calms down the median nerve pain. Start at 300 mg daily and titrate up since patient sensitive to medications. F/u in 4 weeks.

## 2013-05-21 NOTE — Assessment & Plan Note (Signed)
Worsened despite icing and stretching. Patient will use nitroglycerin 0.2 mg TD patch (1/4 patch) daily to help promote healing. Additionally, she will place a heel cup in her shoe for support. Continue icing and stretching. F/u in 4 weeks.

## 2013-05-24 ENCOUNTER — Encounter: Payer: Self-pay | Admitting: Family Medicine

## 2013-06-18 ENCOUNTER — Encounter: Payer: Self-pay | Admitting: Family Medicine

## 2013-06-18 ENCOUNTER — Ambulatory Visit (INDEPENDENT_AMBULATORY_CARE_PROVIDER_SITE_OTHER): Payer: Medicare Other | Admitting: Family Medicine

## 2013-06-18 VITALS — BP 136/86 | HR 79 | Ht 68.0 in | Wt 165.6 lb

## 2013-06-18 DIAGNOSIS — R1032 Left lower quadrant pain: Secondary | ICD-10-CM

## 2013-06-18 DIAGNOSIS — N898 Other specified noninflammatory disorders of vagina: Secondary | ICD-10-CM

## 2013-06-18 DIAGNOSIS — R109 Unspecified abdominal pain: Secondary | ICD-10-CM

## 2013-06-18 LAB — POCT WET PREP (WET MOUNT)

## 2013-06-18 MED ORDER — HYDROCODONE-ACETAMINOPHEN 5-325 MG PO TABS: 1.0000 | ORAL_TABLET | Freq: Four times a day (QID) | ORAL | Status: DC | PRN
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
## 2013-06-18 NOTE — Patient Instructions (Addendum)
I think that your pain is related to constipation and scarring from your surgery. I recommend using a suppository for the next 3 days and drinking lots of water. I think that this will take a lot of pressure off of your abdomen. Also, you can take Norco as needed for pain.   Please come back and see me next week.   Sincerely,   Dr. Clinton Sawyer

## 2013-06-21 ENCOUNTER — Encounter: Payer: Self-pay | Admitting: Family Medicine

## 2013-06-21 DIAGNOSIS — R109 Unspecified abdominal pain: Secondary | ICD-10-CM | POA: Insufficient documentation

## 2013-06-21 NOTE — Assessment & Plan Note (Addendum)
Patient with history of severe chronic abdominal pain thought due to uterine leiomyoma who is s/p TAH in Feb 2014. Currently, there is no evidence of acute abdomen on exam and no history to suggest ileus or obstruction. I doubt there is ischemia colitis given no bloody stool. I also doubt that there is mesenteric ischemia, because the abdomen is soft and the patient would be much more ill if it was occurring for an entire month. Her pain is concentrated around her scar, which gives concern for intra-abdominal adhesions. She is also very distraught emotionally, so this is likely exacerbated by stress.Her bowel sounds are quiet and pt notes only small firm stools, so constipation likely contributing. Lastly, it could also be an abdominal migraine.  - Check vag wet prep  - Encourage use of glycerin suppository daily until large bowel movement - Norco 5-325 mg # 10 given for severe pain - F/u in 1-2 weeks

## 2013-06-23 ENCOUNTER — Encounter: Payer: Self-pay | Admitting: Family Medicine

## 2013-06-23 ENCOUNTER — Ambulatory Visit (INDEPENDENT_AMBULATORY_CARE_PROVIDER_SITE_OTHER): Payer: Medicare Other | Admitting: Family Medicine

## 2013-06-23 VITALS — BP 143/88 | HR 67 | Ht 68.0 in | Wt 163.0 lb

## 2013-06-23 DIAGNOSIS — R109 Unspecified abdominal pain: Secondary | ICD-10-CM

## 2013-06-23 DIAGNOSIS — R1310 Dysphagia, unspecified: Secondary | ICD-10-CM

## 2013-06-23 DIAGNOSIS — R1032 Left lower quadrant pain: Secondary | ICD-10-CM

## 2013-06-23 DIAGNOSIS — F329 Major depressive disorder, single episode, unspecified: Secondary | ICD-10-CM

## 2013-06-23 DIAGNOSIS — G43909 Migraine, unspecified, not intractable, without status migrainosus: Secondary | ICD-10-CM

## 2013-06-23 MED ORDER — HYDROCODONE-ACETAMINOPHEN 5-325 MG PO TABS: 1.0000 | ORAL_TABLET | Freq: Four times a day (QID) | ORAL | Status: DC | PRN

## 2013-06-23 MED ORDER — PANTOPRAZOLE SODIUM 40 MG PO TBEC: 40.0000 mg | DELAYED_RELEASE_TABLET | Freq: Two times a day (BID) | ORAL | Status: DC

## 2013-06-23 MED ORDER — DIVALPROEX SODIUM 500 MG PO DR TAB: 500.0000 mg | DELAYED_RELEASE_TABLET | Freq: Two times a day (BID) | ORAL | Status: DC

## 2013-06-23 MED ORDER — FLUOXETINE HCL 20 MG PO CAPS: 20.0000 mg | ORAL_CAPSULE | Freq: Two times a day (BID) | ORAL | Status: DC

## 2013-06-23 NOTE — Progress Notes (Signed)
  Subjective:    Patient ID: Megan Kemp, female    DOB: 10/04/1961, 52 y.o.   MRN: [id number]  HPI  52 year old F with TAH and bilateral SOP in February 2014 with chronic pain and depression presents for follow up. She was seen last week for severe pain lasting one month. Her history and exam gave no evidence of acute abdomen, colitis, or infection. She was presumed to have pain secondary to severe constipation, possible adhesions from surgery, and somatic pain closely linked to severe uncontrolled depression. She was given Rx for Norco 5-325 mg PO PRN #10 and told to use suppositories to promote bowel movements.   Pain Follow Up  Patient notes that pain is persistent but improved. Taking Norco once daily as needed. Taking suppository every other day and having bowel movements several times a day in large quantities Still denies nausea, vomiting, fever, chills, melena, hematochezia   Depression - - Patient with history of severe depression - Asked about time when not depressed and she states that when her mom was alive because they were very close; Mother died in 65 due to breast cancer; patient repeatedly states how she misses her mother - Sources of current depression: strife with her family as she feels like most  members of her family don't support her or interact with her unless she has something to given them, frequently talks about how they get together without her; Also, no longer with her boyfriend of 4 years, and states that she "just wants somebody to hug me and tell me they love me and everything is going to be all right" - Current support: "man upstairs," a close childhood friend, a nephew - Not currently in therapy, but plans to go to group therapy but cannot tell specifically which one; previously seen our clinial psychologist Spero Geralds, PhD, who she did not get along with; also previously seen at [facility], which is now closed - Taking Fluoxetine, which she  states "really works," but she is crying shortly before she says this  - Denies suicidal ideation    Review of Systems See HPI    Objective:   Physical Exam BP 143/88  Pulse 67  Ht 5\' 8"  (1.727 m)  Wt 163 lb (73.936 kg)  BMI 24.79 kg/m2  LMP 05/06/2011  Gen: middle aged AAF, emotionally distressed, talkative  Abd: soft, non distended, hypoactive bowel sounds, markedly less tender to palpation than previous visit  Psysch - tearful, distressed, repeatedly says things like "it's so hard" and "I just want to be left alone," denies SI      Assessment & Plan:  > 25 minutes spent directly counseling the patient

## 2013-06-23 NOTE — Patient Instructions (Signed)
I think we should continue to work on your abdominal pain by continuing the suppository every other day to help with bowel movements. I also want you to use the pain medication once daily for severe pain. I want you to follow up in 2 weeks. At that time we can talk about the depression.   Follow up in 2 weeks,   Dr. Clinton Sawyer

## 2013-06-24 ENCOUNTER — Telehealth: Payer: Self-pay | Admitting: Family Medicine

## 2013-06-24 NOTE — Assessment & Plan Note (Addendum)
Unfortunately, the patient is still having severe depressive symptoms. It seems to stem from social isolation and lack of trustworthy, loving support which she enjoyed earlier in life from her mother. When discussing medication management, she was suprisingly resistant to changing fluoxetine, even stating that it "really works" while being very tearful and distressed. I think that consistent counseling will be beneficial, but she one again resisted my enoucargement to see Dr. Pascal Lux. I will consider increasing fluoxetine to 80 mg daily and the addition of Abilify in the future.

## 2013-06-24 NOTE — Telephone Encounter (Signed)
I was given this number of Ms. Winograd and called. It was a case worker for [employer]. I left a message stating that a request for any documents can be faxed to [phone].

## 2013-06-24 NOTE — Assessment & Plan Note (Signed)
Pain improving likely secondary to decreased stool burden. Patient still without any evidence of acute abdomen, ileus, obstruction, colitis, ischemia, or infection; Largest component likely somatic pain, with contributions from surgery and constipation. Continue current regimen of suppository every other day which seems to work and pain medication, Norco 5-325 PO daily prn, given # 30 with no refills.

## 2013-07-07 ENCOUNTER — Ambulatory Visit: Payer: Medicare Other | Admitting: Family Medicine

## 2013-07-08 ENCOUNTER — Telehealth: Payer: Self-pay | Admitting: *Deleted

## 2013-07-08 DIAGNOSIS — G43709 Chronic migraine without aura, not intractable, without status migrainosus: Secondary | ICD-10-CM

## 2013-07-08 MED ORDER — DIVALPROEX SODIUM ER 500 MG PO TB24: 1000.0000 mg | ORAL_TABLET | Freq: Every day | ORAL | Status: DC

## 2013-07-08 NOTE — Telephone Encounter (Signed)
New prescription sent to [facility].

## 2013-07-08 NOTE — Telephone Encounter (Signed)
[facility] called and states DEpokote DR is on national back order until October - please change to Depokote ER and send new script - chemical make up is slightly different so dosage may have to be adjusted. Wyatt Haste, RN-BSN

## 2013-07-14 ENCOUNTER — Other Ambulatory Visit: Payer: Self-pay | Admitting: Family Medicine

## 2013-07-14 ENCOUNTER — Encounter: Payer: Self-pay | Admitting: Family Medicine

## 2013-07-14 ENCOUNTER — Ambulatory Visit (INDEPENDENT_AMBULATORY_CARE_PROVIDER_SITE_OTHER): Payer: Medicare Other | Admitting: Family Medicine

## 2013-07-14 VITALS — BP 111/76 | HR 67 | Ht 68.0 in | Wt 166.0 lb

## 2013-07-14 DIAGNOSIS — R109 Unspecified abdominal pain: Secondary | ICD-10-CM

## 2013-07-14 DIAGNOSIS — M7662 Achilles tendinitis, left leg: Secondary | ICD-10-CM

## 2013-07-14 DIAGNOSIS — M7660 Achilles tendinitis, unspecified leg: Secondary | ICD-10-CM

## 2013-07-14 MED ORDER — NITROGLYCERIN 0.2 MG/HR TD PT24: MEDICATED_PATCH | TRANSDERMAL | Status: DC

## 2013-07-14 NOTE — Patient Instructions (Addendum)
Dear Ms. [name],  I am glad that you are doing better. Please take this prescription to a medical supply store, because it will be less expensive there and they can probably bill Medicare for this. If you have a problem, then let me know.   Also, keep icing it at the end of the day. This will help with inflammation.   Come back in 4 weeks for a check up.   Sincerely,   Dr. Clinton Sawyer

## 2013-07-14 NOTE — Assessment & Plan Note (Signed)
Left sided persistent tendonitis with low likelihood of suspicion for injury to calcaneus.  - Continue with nitroglycerin patches daily - Given prescription for CAM walker - F/u in 4 weeks

## 2013-07-14 NOTE — Progress Notes (Signed)
  Subjective:    Patient ID: Megan Kemp, female    DOB: [birth year], 52 y.o.   MRN: [id number]  HPI  52 year old F with chronic pain issues related ot her abomen, back, hands and achilles who presents for follow up.   Abdominal Pain - TAH and bilateral SOP in February 2014 last week for severe pain on 06/23/13 and determined that no acute abdomen, but more likely due to severe chronic constipation and possible contribution from adhesions/scar tissue around her TAH scar. She was given prescription for norco 5/325 # 30 and encouraged to use suppositories weekly. She notes pain is much improved after having large bowel movement. She does not have any pain today. Her appetite and intake is normal. No nausea, vomiting, fever, or chills.    Achilles Tendonitis - Left sided, present for several months, not improved with icing and stretching so patient started in nitroglycerin patches on 05/21/13; she notes minimal improvement in pain since that time despite using patches daily; notes previous use of walking boot when she had this problem years ago but no longer has the boot at home  Review of Systems See HPI    Objective:   Physical Exam  Gen: middle aged AAF, most well appearing I have seen her in months, smiling and very conversant  Left Heel: normal plantar and dorsi-flexion; normal Thompson test; marked point tenderness of calcaneous   Ultrasound of Left Heel: no evidence of fracture or stress reaction to calcaneous, mild edema around achilles tendon      Assessment & Plan:  Additionally, letter provided for patient to be excused for jury duty given her poorly controlled depression (even though she is well appearing today) and chronic pain issues.

## 2013-07-14 NOTE — Assessment & Plan Note (Signed)
Improving and becoming clear that constipation was large contributor. Continue with laxative and suppositories as needed.

## 2013-07-15 ENCOUNTER — Other Ambulatory Visit: Payer: Self-pay | Admitting: Family Medicine

## 2013-08-16 ENCOUNTER — Encounter: Payer: Self-pay | Admitting: Family Medicine

## 2013-08-16 ENCOUNTER — Ambulatory Visit (INDEPENDENT_AMBULATORY_CARE_PROVIDER_SITE_OTHER): Payer: Medicare Other | Admitting: Family Medicine

## 2013-08-16 VITALS — BP 126/85 | HR 73 | Temp 97.1°F | Ht 68.0 in | Wt 168.0 lb

## 2013-08-16 DIAGNOSIS — L738 Other specified follicular disorders: Secondary | ICD-10-CM

## 2013-08-16 DIAGNOSIS — M79609 Pain in unspecified limb: Secondary | ICD-10-CM

## 2013-08-16 DIAGNOSIS — L309 Dermatitis, unspecified: Secondary | ICD-10-CM

## 2013-08-16 DIAGNOSIS — L259 Unspecified contact dermatitis, unspecified cause: Secondary | ICD-10-CM

## 2013-08-16 DIAGNOSIS — M79672 Pain in left foot: Secondary | ICD-10-CM

## 2013-08-16 DIAGNOSIS — L853 Xerosis cutis: Secondary | ICD-10-CM

## 2013-08-16 MED ORDER — TRIAMCINOLONE ACETONIDE 0.1 % EX CREA: TOPICAL_CREAM | Freq: Two times a day (BID) | CUTANEOUS | Status: DC

## 2013-08-16 NOTE — Progress Notes (Deleted)
Patient ID: Megan Kemp, female   DOB: [birth year], 52 y.o.   MRN: [id number]  Subjective:    Patient ID: Megan Kemp, female    DOB: 07/24/1961, 52 y.o.   MRN: [id number]  52 year old F with chronic pain issues related ot her abomen, back, hands and achilles who presents for follow up.  Hand Pain   Pati       A Today she notes -   Left Heel Pain - Left sided, present for several months, not improved with icing and stretching so patient started in nitroglycerin patches on 05/21/13; she notes minimal improvement in pain since that time despite using patches daily; notes previous use of walking boot when she had this problem years ago but no longer has the boot at home.     Today she notes -   Review of Systems See HPI    Objective:   Physical Exam BP 126/85  Pulse 73  Temp(Src) 97.1 F (36.2 C) (Oral)  Ht 5\' 8"  (1.727 m)  Wt 168 lb (76.204 kg)  BMI 25.55 kg/m2  LMP 05/06/2011  Gen: middle aged AAF, most well appearing I have seen her in months, smiling and very conversant  Left Heel: normal plantar and dorsi-flexion; normal Thompson test; marked point tenderness of calcaneous         Assessment & Plan:

## 2013-08-16 NOTE — Patient Instructions (Addendum)
Megan Kemp,   It was great to see you today. I am glad that your heel feels better with that brace. I think your tendon will continue to feel better. I want you to do some exercises that I are listed on the sheet. This will be very helpful.   About the skin on your feet, you have some inflammation there, so you can use a cream called Kenalog twice a day for 2 weeks. [facility]   Please come back in 4 weeks for a check up.   Sincerely,   Dr. Clinton Sawyer

## 2013-08-17 DIAGNOSIS — L853 Xerosis cutis: Secondary | ICD-10-CM | POA: Insufficient documentation

## 2013-08-17 NOTE — Progress Notes (Signed)
  Subjective:    Patient ID: Megan Kemp, female    DOB: 26-Oct-1961, 52 y.o.   MRN: [id number]  HPI  52 year old F with chronic pain issues related ot her abdomen, back, hands and left achilles/heel who presents for follow up of left heel pain and concern for dryness/scaling of left foot.   Left Heel Pain - Started April 2014, presumed to be achilles tendonitis based on exam and history; started with conservative therapy of ice and stretching for 5 weeks, which did not work; therefore started on regimen of nitroglycerin 0.2 mg patches for 8 weeks, which led to minimal improvement; Patient seen on 07/15/13 and encouraged to purchase a CAM walker;  - patient presents today wearing an ankle brace on her left ankle and states that her pain is "100 percent better" when wearing her ankle brace; she is not regularly using nitroglycerin; is not doing any exercises   Dry Skin of Left Foot - patient concerned that there is a problem with the skin of her left foot since it is scaling and peeling despite use of regular moisturizer; no history of similar problem with foot; mild itching; no redness or swelling, located on medial side of foot without rash in her toes     Review of Systems See HPI    Objective:   Physical Exam BP 126/85  Pulse 73  Temp(Src) 97.1 F (36.2 C) (Oral)  Ht 5\' 8"  (1.727 m)  Wt 168 lb (76.204 kg)  BMI 25.55 kg/m2  LMP 05/06/2011  Gen: middle aged AAF, well appearing, smiling and very conversant  Left Heel: normal plantar and dorsi-flexion; normal Thompson test; marked point tenderness of calcaneous  Skin of left foot: mild xerosis without redness or edema, minimal scaling without true rash      Assessment & Plan:

## 2013-08-17 NOTE — Assessment & Plan Note (Signed)
Assessment: Etiology of pain possible achilles tendonopathy vs calcaneal spurring; regardless pain is improving with the use of a left ankle brace despite my recommendation for a CAM walker; the patient greatly benefits from using walking sticks for her back pain and wrist braces for her carpal tunnel pain, so I'm not surprised that this intervention has been the most effective even if not a logical anatomical explanation for the pain Plan:  - Continue to use ankle brace since it is effective for the patient, however, I am concerned about weakening of ligaments with chronic use, so I will caution against that at next visit - Given eccentric exercises handout and demonstrated manuevers - If pain worsens, consider referral to Sports Med for detailed ultrasound

## 2013-08-17 NOTE — Assessment & Plan Note (Signed)
Assessment: dry skin and scaling from mild inflammatory process Plan: given rx for triamcinolone PRN

## 2013-09-14 ENCOUNTER — Other Ambulatory Visit: Payer: Self-pay | Admitting: Family Medicine

## 2013-09-15 ENCOUNTER — Encounter: Payer: Self-pay | Admitting: Family Medicine

## 2013-09-15 ENCOUNTER — Ambulatory Visit (INDEPENDENT_AMBULATORY_CARE_PROVIDER_SITE_OTHER): Payer: Medicare Other | Admitting: Family Medicine

## 2013-09-15 VITALS — BP 118/82 | HR 89 | Ht 68.0 in | Wt 169.0 lb

## 2013-09-15 DIAGNOSIS — M79609 Pain in unspecified limb: Secondary | ICD-10-CM

## 2013-09-15 DIAGNOSIS — M79672 Pain in left foot: Secondary | ICD-10-CM

## 2013-09-15 DIAGNOSIS — F329 Major depressive disorder, single episode, unspecified: Secondary | ICD-10-CM

## 2013-09-15 NOTE — Patient Instructions (Addendum)
I am so sorry to hear about your pocket book.   Your achilles is looking a lot better. It is smart to wear those shoes that have padding. If you need to ice at the end day you can do that. If you are at home, don't wear the brace so that you will keep the ankle strong.   Please follow up in 4 weeks. I will give you a flu shot at that time if you want!  Sincerely,   Dr. Clinton Sawyer

## 2013-09-15 NOTE — Assessment & Plan Note (Signed)
Assessment: stable mood without severe depression at this point Plan: continue fluoxetine at 40 mg daily, encourage patient to continue to spend time with her family

## 2013-09-15 NOTE — Assessment & Plan Note (Signed)
Assessment: stable left heel pain without evidence of weakness Plan: patient encouraged to continue wearing comfortable shoes with good hip and left, as well as continuing acentric exercises; a little ice as needed to; Instructed not to use brace at all times because it may weaken the ankle; followup in one month

## 2013-09-15 NOTE — Progress Notes (Signed)
  Subjective:    Patient ID: Megan Kemp, female    DOB: 15-Apr-1961, 52 y.o.   MRN: [id number]  HPI  52 year old F with chronic abdominal pain, carpal tunnel, left achilles tendonitis, and depression who presents for follow up left heel.  Left heel pain: Started April 2014, presumed to be achilles tendonitis based on exam and history; started with conservative therapy of ice and stretching for 5 weeks, which did not work; therefore started on regimen of nitroglycerin 0.2 mg patches for 8 weeks, which led to minimal improvement; Patient seen on 07/15/13 and encouraged to purchase a CAM walker;  She was last seen for left heel pain in August and pain was improved by wearing a brace. She was instructed to perform eccentric exercises as well.   The patient reports today that she is continued to have moderate left pain is worse in the morning but generally improved over the day. She continues to wear her brace. She also has been using athletic shoes with exercise, which she feels is helping. She is not that his pain is preventing her from doing anything she wants to do. She's not he is taking a pain medication for this problem.   Depression:  Patient states "I'm a little depressed, but be all right," she's been feeling good recently because she is spending time with her grandson who makes her happy, otherwise she has no new stressors and feels like she can manage things she needs to do during the day, her energy level is stable  Influenza vaccine: patient denies my advice receiving influenza vaccine, states that she gets sick from it  Review of Systems See history of present illness    Objective:   Physical Exam BP 118/82  Pulse 89  Ht 5\' 8"  (1.727 m)  Wt 169 lb (76.658 kg)  BMI 25.7 kg/m2  LMP 05/06/2011  Gen: middle aged AAF, well appearing, smiling and very conversant  Left Heel: normal plantar and dorsi-flexion; normal Thompson test; marked point tenderness of calcaneous  Psych:  dynamic affect, normal content speech pattern, very upbeat compared to baseline     Assessment & Plan:

## 2013-10-13 ENCOUNTER — Ambulatory Visit (INDEPENDENT_AMBULATORY_CARE_PROVIDER_SITE_OTHER): Payer: Medicare Other | Admitting: Family Medicine

## 2013-10-13 ENCOUNTER — Encounter: Payer: Self-pay | Admitting: Family Medicine

## 2013-10-13 VITALS — BP 137/88 | HR 72 | Ht 68.0 in | Wt 172.0 lb

## 2013-10-13 DIAGNOSIS — F329 Major depressive disorder, single episode, unspecified: Secondary | ICD-10-CM

## 2013-10-13 DIAGNOSIS — M79609 Pain in unspecified limb: Secondary | ICD-10-CM

## 2013-10-13 DIAGNOSIS — M79672 Pain in left foot: Secondary | ICD-10-CM

## 2013-10-13 NOTE — Patient Instructions (Signed)
It was great to see you today Ms. [name]. Keep a positive attitude and someone good will come into your life.   Please come back and see me after you see the neurologist in [facility].   Take Care,   Dr. Clinton Sawyer

## 2013-10-13 NOTE — Assessment & Plan Note (Signed)
Assessment: worsening of mood with depressive symptoms recently due to social situation and holidays, which cannot be altered, patient optimistic that she will improve Plan: I spent greater than 15 minutes discussing her mood and its relationship to her family, her social life and financial stressors; patient was more well appearing at the end of the visit so no further evaluation or treatments needed at this time, continue fluoxetine 40 mg daily

## 2013-10-13 NOTE — Progress Notes (Signed)
  Subjective:    Patient ID: Megan Kemp, female    DOB: Jun 06, 1961, 52 y.o.   MRN: [id number]  HPI  52 year old F who presents for evaluation of left heel pain.   Left heel pain:   - April 2014, presumed to be achilles tendonitis based on exam and history; started with conservative therapy of ice and stretching for 5 weeks, which did not work; therefore started on regimen of nitroglycerin 0.2 mg patches for 8 weeks, which led to minimal improvement  - July 2014 and encouraged to purchase a CAM walker;   - August 2014 and pain was improved by wearing a brace. She was instructed to perform eccentric exercises as well.  - September - evaluated and encouraged continued eccentric exercise, decrease use of brace and to wear comfortable sole  Today she reports that pain is improved. She is riding a stationary bike for exercise and no using her   Depression: Patient with chronic recurrent depression; treated with fluoxetine 40 mg daily. Today patient states that depression is "really bad" . She relates the depression to the coming holidays which cause her to miss her mother who is deceased and she also feel continued frustration and sadness regarding the dysfunction of her family after the death of her mother. This status of losing her mother and subsequent social isolation had been the strongest factors and precipitating her depression since I've known the patient. She states that she has a desire for companionship but has difficulty trusting other people. She also does not like being out of work. The things that she enjoys most are spending time with her grandchildren. She recently went to the [facility] [employer] homecoming with one of her best friends, which she enjoyed. She believes that her mood will improve after the Thanksgiving and Christmas holidays and denies any desire to harm herself or others. Her coping strategies include praying and exercise.    Review of Systems     Objective:   Physical Exam BP 137/88  Pulse 72  Ht 5\' 8"  (1.727 m)  Wt 172 lb (78.019 kg)  BMI 26.16 kg/m2  LMP 05/06/2011 Gen: middle aged AAF, conversant, tearful MSK: left ankle exam deferred secondary to patient's emotional state Psych: crying throughout exam but smiling at the time of discharge, repeatedly states that she "needs" to vent; no SI/HI       Assessment & Plan:

## 2013-10-13 NOTE — Assessment & Plan Note (Signed)
Assessment: stable and improving per patient Plan: encouraged continued exercise and stretching, patient will follow up in 4 weeks

## 2013-11-01 ENCOUNTER — Other Ambulatory Visit: Payer: Self-pay | Admitting: Family Medicine

## 2013-11-01 DIAGNOSIS — Z1231 Encounter for screening mammogram for malignant neoplasm of breast: Secondary | ICD-10-CM

## 2013-11-30 ENCOUNTER — Other Ambulatory Visit: Payer: Self-pay

## 2013-11-30 ENCOUNTER — Encounter (HOSPITAL_COMMUNITY): Payer: Self-pay | Admitting: Emergency Medicine

## 2013-11-30 ENCOUNTER — Emergency Department (HOSPITAL_COMMUNITY): Payer: Medicare Other

## 2013-11-30 ENCOUNTER — Emergency Department (HOSPITAL_COMMUNITY)
Admission: EM | Admit: 2013-11-30 | Discharge: 2013-11-30 | Disposition: A | Discharge status: Home / Self Care | Payer: Medicare Other | Attending: Emergency Medicine | Admitting: Emergency Medicine

## 2013-11-30 DIAGNOSIS — R079 Chest pain, unspecified: Secondary | ICD-10-CM

## 2013-11-30 DIAGNOSIS — IMO0002 Reserved for concepts with insufficient information to code with codable children: Secondary | ICD-10-CM | POA: Insufficient documentation

## 2013-11-30 DIAGNOSIS — R0789 Other chest pain: Secondary | ICD-10-CM | POA: Insufficient documentation

## 2013-11-30 DIAGNOSIS — Z862 Personal history of diseases of the blood and blood-forming organs and certain disorders involving the immune mechanism: Secondary | ICD-10-CM | POA: Insufficient documentation

## 2013-11-30 DIAGNOSIS — G894 Chronic pain syndrome: Secondary | ICD-10-CM | POA: Insufficient documentation

## 2013-11-30 DIAGNOSIS — G43109 Migraine with aura, not intractable, without status migrainosus: Secondary | ICD-10-CM | POA: Insufficient documentation

## 2013-11-30 DIAGNOSIS — F410 Panic disorder [episodic paroxysmal anxiety] without agoraphobia: Secondary | ICD-10-CM | POA: Insufficient documentation

## 2013-11-30 DIAGNOSIS — Z91040 Latex allergy status: Secondary | ICD-10-CM | POA: Insufficient documentation

## 2013-11-30 DIAGNOSIS — K219 Gastro-esophageal reflux disease without esophagitis: Secondary | ICD-10-CM | POA: Insufficient documentation

## 2013-11-30 DIAGNOSIS — Z8742 Personal history of other diseases of the female genital tract: Secondary | ICD-10-CM | POA: Insufficient documentation

## 2013-11-30 LAB — CBC WITH DIFFERENTIAL/PLATELET
Basophils Absolute: 0 10*3/uL (ref 0.0–0.1)
Basophils Relative: 0 % (ref 0–1)
Eosinophils Absolute: 0.1 10*3/uL (ref 0.0–0.7)
Eosinophils Relative: 1 % (ref 0–5)
HCT: 37 % (ref 36.0–46.0)
Hemoglobin: 12.6 g/dL (ref 12.0–15.0)
Lymphocytes Relative: 24 % (ref 12–46)
Lymphs Abs: 1.7 10*3/uL (ref 0.7–4.0)
MCH: 29.7 pg (ref 26.0–34.0)
MCHC: 34.1 g/dL (ref 30.0–36.0)
MCV: 87.3 fL (ref 78.0–100.0)
Monocytes Absolute: 0.8 10*3/uL (ref 0.1–1.0)
Monocytes Relative: 12 % (ref 3–12)
Neutro Abs: 4.4 10*3/uL (ref 1.7–7.7)
Neutrophils Relative %: 63 % (ref 43–77)
Platelets: 358 10*3/uL (ref 150–400)
RBC: 4.24 MIL/uL (ref 3.87–5.11)
RDW: 14.2 % (ref 11.5–15.5)
WBC: 7 10*3/uL (ref 4.0–10.5)

## 2013-11-30 LAB — BASIC METABOLIC PANEL
BUN: 10 mg/dL (ref 6–23)
CO2: 21 mEq/L (ref 19–32)
Calcium: 9.4 mg/dL (ref 8.4–10.5)
Chloride: 96 mEq/L (ref 96–112)
Creatinine, Ser: 0.84 mg/dL (ref 0.50–1.10)
GFR calc Af Amer: >90 mL/min (ref >90)
GFR calc non Af Amer: 79 mL/min — ABNORMAL LOW (ref >90)
Glucose, Bld: 113 mg/dL — ABNORMAL HIGH (ref 70–99)
Potassium: 3.3 mEq/L — ABNORMAL LOW (ref 3.5–5.1)
Sodium: 133 mEq/L — ABNORMAL LOW (ref 135–145)

## 2013-11-30 LAB — TROPONIN I: Troponin I: <0.3 ng/mL (ref <0.30)

## 2013-11-30 MED ORDER — LORAZEPAM 2 MG/ML IJ SOLN
1.0000 mg | Freq: Once | INTRAMUSCULAR | Status: AC
  Administered 2013-11-30: 1 mg via INTRAVENOUS
  Filled 2013-11-30: qty 1
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
## 2013-11-30 NOTE — ED Notes (Signed)
Pt presents to ED with c/o central chest pain, onset at around 2045.

## 2013-12-02 ENCOUNTER — Telehealth: Payer: Self-pay | Admitting: Family Medicine

## 2013-12-02 NOTE — Telephone Encounter (Signed)
Forward to PCP for refill.Busick, Robert Lee  

## 2013-12-02 NOTE — Telephone Encounter (Signed)
Refill request for albuterol inhaler. Please call patient when sent [facility] [address]

## 2013-12-06 ENCOUNTER — Ambulatory Visit (HOSPITAL_COMMUNITY)
Admission: RE | Admit: 2013-12-06 | Discharge: 2013-12-06 | Disposition: A | Discharge status: Home / Self Care | Payer: Medicare Other | Source: Ambulatory Visit | Attending: Family Medicine | Admitting: Family Medicine

## 2013-12-06 DIAGNOSIS — Z1231 Encounter for screening mammogram for malignant neoplasm of breast: Secondary | ICD-10-CM

## 2013-12-06 NOTE — Telephone Encounter (Signed)
I have no record of patient ever being on albuterol and no record of reactive airway disease. Therefore, I will not prescribed albuterol. If she is having breathing difficulty, then she needs to be seen in clinic.

## 2013-12-06 NOTE — Telephone Encounter (Signed)
Called pt. Mail box is full. Waiting for call back. Please see Dr.Williamson's message. Thanks. Lorenda Hatchet, [name]

## 2013-12-06 NOTE — Telephone Encounter (Signed)
Informed pt. She agreed. Lorenda Hatchet, [name]

## 2013-12-15 ENCOUNTER — Ambulatory Visit (INDEPENDENT_AMBULATORY_CARE_PROVIDER_SITE_OTHER): Payer: Medicare Other | Admitting: Family Medicine

## 2013-12-15 ENCOUNTER — Encounter: Payer: Self-pay | Admitting: Family Medicine

## 2013-12-15 VITALS — BP 137/87 | HR 82 | Temp 98.2°F | Ht 68.0 in | Wt 167.0 lb

## 2013-12-15 DIAGNOSIS — J069 Acute upper respiratory infection, unspecified: Secondary | ICD-10-CM

## 2013-12-15 NOTE — Patient Instructions (Signed)
Dear Ms. Conrey,   It was great to see you today. Please read below about the issues.   1. Cough and congestion - this is consistent with a viral respiratory infection that will get better on it's own. It may take another week or two until this feels completely better. Until that time please use cough drops, syrup or use tea with honey to help the cough.  2. Disability forms - we will fax these to the doctors so I can get the records.   Have a merry christmas and happy new year! Thank you for my gift.   Please come back in 2 months.   Dr. Clinton Sawyer

## 2013-12-15 NOTE — Progress Notes (Signed)
   Subjective:    Patient ID: Megan Kemp, female    DOB: [birth year], 53 y.o.   MRN: [id number]  HPI  52 year old F with recurrent depression and chronic pain who presents for follow up. She also have migraine headaches for which she sees a neurologist at [facility]. Today she f/u for cough. Non productive. Present for 2 weeks. Unchanged. Not associated with fever. Causes chest pain during cough. Cough not relieved by OTC medications. No shortness of breath at rest.    Review of Systems Worsening anxiety with recent anxiety attack    Objective:   Physical Exam  BP 137/87  Pulse 82  Temp(Src) 98.2 F (36.8 C) (Oral)  Wt 167 lb (75.751 kg)  LMP 05/06/2011 Gen: middle aged female, well appearing, NAD, pleasant and conversant HEENT: NCAT, PERRLA, EOMI, OP clear and moist, no lymphadenopathy, no thyroid tenderness, enlargement, or nodules CV: RRR, no m/r/g, no JVD or carotid bruits Pulm: normal WOB, CTA-B       Assessment & Plan:  Pt with cough due to viral URI. No concern for pneumonia at this time. Expectant mgt encouraged.

## 2014-02-09 ENCOUNTER — Encounter: Payer: Self-pay | Admitting: Family Medicine

## 2014-02-09 ENCOUNTER — Ambulatory Visit (INDEPENDENT_AMBULATORY_CARE_PROVIDER_SITE_OTHER): Payer: Medicare Other | Admitting: Family Medicine

## 2014-02-09 ENCOUNTER — Ambulatory Visit (HOSPITAL_COMMUNITY)
Admission: RE | Admit: 2014-02-09 | Discharge: 2014-02-09 | Disposition: A | Discharge status: Home / Self Care | Payer: Medicare Other | Source: Ambulatory Visit | Attending: Family Medicine | Admitting: Family Medicine

## 2014-02-09 VITALS — BP 128/78 | HR 67 | Temp 98.0°F | Ht 68.0 in | Wt 176.0 lb

## 2014-02-09 DIAGNOSIS — R109 Unspecified abdominal pain: Secondary | ICD-10-CM

## 2014-02-09 DIAGNOSIS — F3289 Other specified depressive episodes: Secondary | ICD-10-CM

## 2014-02-09 DIAGNOSIS — F329 Major depressive disorder, single episode, unspecified: Secondary | ICD-10-CM

## 2014-02-09 DIAGNOSIS — G894 Chronic pain syndrome: Secondary | ICD-10-CM

## 2014-02-09 DIAGNOSIS — R1032 Left lower quadrant pain: Secondary | ICD-10-CM

## 2014-02-09 DIAGNOSIS — F32A Depression, unspecified: Secondary | ICD-10-CM

## 2014-02-09 MED ORDER — AMITRIPTYLINE HCL 50 MG PO TABS: 50.0000 mg | ORAL_TABLET | Freq: Every day | ORAL | Status: DC

## 2014-02-09 MED ORDER — OXYCODONE-ACETAMINOPHEN 5-325 MG PO TABS: 1.0000 | ORAL_TABLET | Freq: Three times a day (TID) | ORAL | Status: DC | PRN

## 2014-02-09 NOTE — Assessment & Plan Note (Signed)
A: chronic pain without signs of acute abdomen s/p hysterectomy with BSO 1 year ago; not sexually active so no concern for infection; having 2 BM per day, but still could have component of constipation; closely associated with mood disorder which is poorly controlled, nonetheless I don't want to miss something like arthritis of the hip since she is complain of radiating pain in her leg which I do not remember from before P:  - X-ray left hip - Restart amitriptyline 50 QHS - Given percocent 5-325 mg PO q 8 hrs PRN # 20; low abuse potential since pt has not had in > 6 mos - Counseled on need to avoid excessive work up since pt had numerous previous procedures that did not help and may have worsened the pain; pt agreeable to conservative approach
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
## 2014-02-09 NOTE — Patient Instructions (Signed)
Ms. Pope,   It made my day to see you. You are very kind, and I really appreciate the Christmas gifts for my wife and me.   For your pain, you are right to follow up at [facility] for the colonoscopy. I want to make sure this is not coming from your hip, so I will get the X-ray today. Take the percocet as needed and start the amitriptyline at night. This can help with the depression and pain.   Come back to see me after your colonoscopy,   Dr. Maricela Bo

## 2014-02-09 NOTE — Assessment & Plan Note (Signed)
A: pt with obvious depression which is typical for her and worsened by pain and social isolation; unfortuantely pt's pain has not improved with numerous pelvic and GYN procedures and her tendency to socially isolate herself has not changed, so it is predictable that her depression would be persistent; moreover, pt never bought in to idea of adding TCA to SSRI for treatment P: - Cont fluoxetine 40 mg daily, consider increase to 60 or 80 mg daily - Add amitriptyline 50 mg QHS with pt buy in - Cont to monitor

## 2014-03-02 ENCOUNTER — Ambulatory Visit (INDEPENDENT_AMBULATORY_CARE_PROVIDER_SITE_OTHER): Payer: Medicare Other | Admitting: Family Medicine

## 2014-03-02 VITALS — BP 126/79 | HR 72 | Temp 97.9°F | Wt 171.0 lb

## 2014-03-02 DIAGNOSIS — F3289 Other specified depressive episodes: Secondary | ICD-10-CM

## 2014-03-02 DIAGNOSIS — F329 Major depressive disorder, single episode, unspecified: Secondary | ICD-10-CM

## 2014-03-02 NOTE — Patient Instructions (Signed)
Dear Mrs. Mulkern,   Thank you for coming to clinic today. Please read below regarding the issues that we discussed.   1. Mood - You mood is great today. Please continue the prozac during the day and elevail (also called amitriptyline) each night. Let me know if you need refills.   2. Jury Duty - I will give you a letter.   Please follow up in clinic in 1 month. Please call earlier if you have any questions or concerns.   Sincerely,   Dr. Maricela Bo

## 2014-03-02 NOTE — Progress Notes (Signed)
   Subjective:    Patient ID: Megan Kemp, female    DOB: Jun 15, 1961, 53 y.o.   MRN: [id number]  HPI  53 year old F with depression and chronic pain.  Depression, Chronic - Supposed to take amitryptyline and fluoxetine. She reports that she is taking both medication and denies feeling depressed today. She says "I'm smiling. It's a good day." She is interested in routine counseling, which she believes helped her in the past.   Colon Cancer Screening - Pt recently had a routine colonoscopy at [place] for f/u of polyps. She brings in the report which negative for any suspicious findings. It is recommended that she repeat this in 3 years. The patient denies any abdominal pain today.   Current Outpatient Prescriptions on File Prior to Visit  Medication Sig Dispense Refill  . amitriptyline (ELAVIL) 50 MG tablet Take 1 tablet (50 mg total) by mouth at bedtime.  30 tablet  5  . cetirizine (ZYRTEC) 10 MG tablet Take 1 tablet (10 mg total) by mouth daily.  30 tablet  1  . cetirizine (ZYRTEC) 10 MG tablet Take 10 mg by mouth daily.      Marland Kitchen conjugated estrogens (PREMARIN) vaginal cream Place vaginally daily. One applicator full per vagina twice weekly      . divalproex (DEPAKOTE ER) 500 MG 24 hr tablet Take 2 tablets (1,000 mg total) by mouth daily.  60 tablet  11  . docusate sodium (COLACE) 100 MG capsule 100 mg.      . FLUoxetine (PROZAC) 20 MG capsule Take 40 mg by mouth daily.      . Multiple Vitamin (MULTIVITAMIN) tablet Take 1 tablet by mouth daily.        . nitroGLYCERIN (NITRODUR - DOSED IN MG/24 HR) 0.2 mg/hr CUT PATCH INTO QUARTERS AND PLACE 1 PATCH ON THE LEFT HEEL EACH DAY AFTER REMOVING OLD PATCH  30 patch  0  . oxyCODONE-acetaminophen (ROXICET) 5-325 MG per tablet Take 1 tablet by mouth every 8 (eight) hours as needed for severe pain.  20 tablet  0  . pantoprazole (PROTONIX) 40 MG tablet 40 mg.      . VOLTAREN 1 % GEL APPLY TOPICALLY 4 TIMES A DAY AS NEEDED  1 Tube  3   No  current facility-administered medications on file prior to visit.     Review of Systems Positive for intermittent hand swelling that is not present today Negative for depressed mood, anxiety, and pain    Objective:   Physical Exam BP 126/79  Pulse 72  Temp(Src) 97.9 F (36.6 C) (Oral)  Wt 171 lb (77.565 kg)  LMP 05/06/2011   Gen: middle age AAF,non distressed, pleasant Hands: normal appearing, no edema, normal flexion and extension of fingers, normal grip strength Pscyh: dynamic affect without crying (which is atypical for her), normal thought content and speech pattern     Assessment & Plan:   Swelling of Hands: no evidence of swelling today, may be a transient fluid shift, but pt without trauma, no hx of arthritis, and no known lymphatic disease and is well nourished, so I do not know the cause. However, no work up indicated at this time.

## 2014-03-06 NOTE — Assessment & Plan Note (Signed)
A: happy appearing today which is a nice change P: cont amitriptyline 50 mg QHS and prozac 40 mg daily with plan to increase to 80 mg as needed; pt is going to call counselor for appointment; f/u 1 month

## 2014-03-21 ENCOUNTER — Encounter: Payer: Self-pay | Admitting: Family Medicine

## 2014-03-21 DIAGNOSIS — Z860100 Personal history of colon polyps, unspecified: Secondary | ICD-10-CM | POA: Insufficient documentation

## 2014-03-30 ENCOUNTER — Encounter: Payer: Self-pay | Admitting: Family Medicine

## 2014-03-30 ENCOUNTER — Ambulatory Visit (INDEPENDENT_AMBULATORY_CARE_PROVIDER_SITE_OTHER): Payer: Medicare Other | Admitting: Family Medicine

## 2014-03-30 VITALS — BP 146/88 | HR 69 | Temp 98.8°F | Ht 68.0 in | Wt 169.0 lb

## 2014-03-30 DIAGNOSIS — F3289 Other specified depressive episodes: Secondary | ICD-10-CM

## 2014-03-30 DIAGNOSIS — F329 Major depressive disorder, single episode, unspecified: Secondary | ICD-10-CM

## 2014-03-30 DIAGNOSIS — G8929 Other chronic pain: Secondary | ICD-10-CM

## 2014-03-30 DIAGNOSIS — R52 Pain, unspecified: Secondary | ICD-10-CM
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
## 2014-03-31 NOTE — Assessment & Plan Note (Signed)
Stable, no pain

## 2014-03-31 NOTE — Assessment & Plan Note (Signed)
Assessment: chronic, stable Plan: Although patient continues to perseverate on stressors in her life, she does not want to change her medical regimen at this time. Therefore no changes will be made. Followup in one month.

## 2014-04-29 ENCOUNTER — Ambulatory Visit (INDEPENDENT_AMBULATORY_CARE_PROVIDER_SITE_OTHER): Payer: Medicare Other | Admitting: Family Medicine

## 2014-04-29 ENCOUNTER — Encounter: Payer: Self-pay | Admitting: Family Medicine

## 2014-04-29 VITALS — BP 147/90 | HR 72 | Temp 97.8°F | Ht 68.0 in | Wt 166.5 lb

## 2014-04-29 DIAGNOSIS — F329 Major depressive disorder, single episode, unspecified: Secondary | ICD-10-CM

## 2014-04-29 DIAGNOSIS — F3289 Other specified depressive episodes: Secondary | ICD-10-CM
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
## 2014-04-29 NOTE — Assessment & Plan Note (Signed)
Assessment: chronic, stable Plan:Cont fluoxetine. Followup in one month.

## 2014-04-29 NOTE — Patient Instructions (Signed)
Dear Ms. Wandel,   Thank you for coming to clinic today. Happy 53nd birthday!! I am glad that you have a big party with family.   1. Mood - You seem happier than I have seen you in a long time  2. Scar - This should get better over time. If it does not, then we can inject it with steroids.   Please follow up in clinic in 1 month. Please call earlier if you have any questions or concerns.   Sincerely,   Dr. Maricela Bo

## 2014-06-01 ENCOUNTER — Ambulatory Visit (INDEPENDENT_AMBULATORY_CARE_PROVIDER_SITE_OTHER): Payer: Medicare Other | Admitting: Family Medicine

## 2014-06-01 ENCOUNTER — Telehealth: Payer: Self-pay | Admitting: Family Medicine

## 2014-06-01 ENCOUNTER — Encounter: Payer: Self-pay | Admitting: Family Medicine

## 2014-06-01 VITALS — BP 125/81 | HR 76 | Ht 68.0 in | Wt 167.0 lb

## 2014-06-01 DIAGNOSIS — F329 Major depressive disorder, single episode, unspecified: Secondary | ICD-10-CM

## 2014-06-01 DIAGNOSIS — R21 Rash and other nonspecific skin eruption: Secondary | ICD-10-CM

## 2014-06-01 DIAGNOSIS — F3289 Other specified depressive episodes: Secondary | ICD-10-CM

## 2014-06-01 DIAGNOSIS — T148 Other injury of unspecified body region: Secondary | ICD-10-CM

## 2014-06-01 DIAGNOSIS — W57XXXA Bitten or stung by nonvenomous insect and other nonvenomous arthropods, initial encounter: Secondary | ICD-10-CM

## 2014-06-01 MED ORDER — DOXYCYCLINE HYCLATE 100 MG PO TABS: 100.0000 mg | ORAL_TABLET | Freq: Two times a day (BID) | ORAL | Status: DC

## 2014-06-01 NOTE — Patient Instructions (Signed)
Ms. Legacie, [place] to see you. I am sorry that are having tough time right now. Please take doxycycline 100 mg twice a day for the next 10 days.   Keep smiling! Check back in 2 weeks if it is not getting better.    Sincerely,   Dr. Maricela Bo
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
## 2014-06-02 NOTE — Assessment & Plan Note (Signed)
A: depressed mood today due to social isolation, family strife and financial issues P:  - pt counseled on self-worth and mood improved - cont elavil and fluoxetine

## 2014-06-02 NOTE — Assessment & Plan Note (Signed)
A: localized tender inflammation is allergic vs infectious P: treat like starting cellulitis with doxy, which will also cover tick-borne illness

## 2014-06-15 NOTE — Telephone Encounter (Signed)
Called to schedule IAWV. When pt returns call please schedule appt for 60 minutes.

## 2014-06-21 ENCOUNTER — Ambulatory Visit: Payer: Medicare Other | Admitting: Home Health Services

## 2014-06-21 ENCOUNTER — Encounter: Payer: Self-pay | Admitting: Family Medicine

## 2014-06-21 ENCOUNTER — Ambulatory Visit (INDEPENDENT_AMBULATORY_CARE_PROVIDER_SITE_OTHER): Payer: Medicare Other | Admitting: Family Medicine

## 2014-06-21 VITALS — BP 139/91 | HR 77 | Temp 98.2°F | Wt 161.5 lb

## 2014-06-21 DIAGNOSIS — R52 Pain, unspecified: Secondary | ICD-10-CM

## 2014-06-21 DIAGNOSIS — G43709 Chronic migraine without aura, not intractable, without status migrainosus: Secondary | ICD-10-CM | POA: Diagnosis not present

## 2014-06-21 DIAGNOSIS — IMO0002 Reserved for concepts with insufficient information to code with codable children: Secondary | ICD-10-CM

## 2014-06-21 DIAGNOSIS — F3289 Other specified depressive episodes: Secondary | ICD-10-CM

## 2014-06-21 DIAGNOSIS — K5900 Constipation, unspecified: Secondary | ICD-10-CM

## 2014-06-21 DIAGNOSIS — F329 Major depressive disorder, single episode, unspecified: Secondary | ICD-10-CM

## 2014-06-21 DIAGNOSIS — Z0000 Encounter for general adult medical examination without abnormal findings: Secondary | ICD-10-CM

## 2014-06-21 DIAGNOSIS — M25529 Pain in unspecified elbow: Secondary | ICD-10-CM

## 2014-06-21 DIAGNOSIS — M25522 Pain in left elbow: Secondary | ICD-10-CM

## 2014-06-21 DIAGNOSIS — R21 Rash and other nonspecific skin eruption: Secondary | ICD-10-CM

## 2014-06-21 DIAGNOSIS — G8929 Other chronic pain: Secondary | ICD-10-CM

## 2014-06-21 MED ORDER — FLUOXETINE HCL 20 MG PO CAPS: 40.0000 mg | ORAL_CAPSULE | Freq: Two times a day (BID) | ORAL | Status: DC

## 2014-06-21 MED ORDER — DOCUSATE SODIUM 100 MG PO CAPS: 100.0000 mg | ORAL_CAPSULE | Freq: Every day | ORAL | Status: DC | PRN

## 2014-06-21 MED ORDER — IBUPROFEN 800 MG PO TABS: 800.0000 mg | ORAL_TABLET | Freq: Three times a day (TID) | ORAL | Status: DC | PRN

## 2014-06-21 MED ORDER — DIVALPROEX SODIUM ER 500 MG PO TB24: 1000.0000 mg | ORAL_TABLET | Freq: Every day | ORAL | Status: DC

## 2014-06-21 NOTE — Assessment & Plan Note (Signed)
resolved 

## 2014-06-21 NOTE — Assessment & Plan Note (Signed)
Poorly controlled Pt willing to increase to fluoxetine 80 mg po daily. Encourage continued church attendance for spiritual healing.

## 2014-06-21 NOTE — Assessment & Plan Note (Signed)
A: no sign of effusion, infection or new injury to left arm, likely chronic arthritis changes after injury to the joint P: ibuprofen 800 mg TID prn

## 2014-06-21 NOTE — Patient Instructions (Signed)
Dear Ms. Megan Kemp,   Thank you for coming to clinic today. Please read below regarding the issues that we discussed.   1. Depression - Increase the medication to 2 tablets in the morning and at night.   2. Scarring -Try hydrocortisone cream for the bumps. That will help with the inflammation.   Please follow up in clinic in 2 months with Dr. Raeford Razor. You know I will be thinking about you and wish you the best.   Sincerely,   Dr. Maricela Bo
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
## 2014-06-21 NOTE — Progress Notes (Signed)
Patient here for annual wellness visit, patient reports: Risk Factors/Conditions needing evaluation or treatment: Pt. Does not have any NEW risk factors that need evaluation.  Pt scored 9 on PHQ9, PCP increased depression medication, introduced patient to [place] our [employer] officer, [employer] who provided resources for counseling as well as gave the patient her phone number to follow up for counseling.  Home Safety: Pt lives at home, by self in a 1 story home.  Pt reports has smoke alarms. Other Information: Corrective lens: Pt does not wear corrective lens, has annual eye exams. Dentures: Pt has full dentures. Memory: Pt reports some memory problems.  Patient's Mini Mental Score (recorded in doc. flowsheet): 28 Bladder:  Pt denies problems with bladder control.  BMI/Exercise: We discussed BMI and strategies for weight maintenance including sharing resources for food access.  We also discussed  starting a regular exercise routine.  Med Adherence:  We discussed importance of taking medications for htn. ADL/IADL:  Pt reports independence in all functions. Balance/Gait: Pt reports 1 falls in the past year.  We discussed home safety and fall prevention.   Pt is blind in left eye.  Uses canes for walking.      Annual Wellness Visit Requirements Recorded Today In  Medical, family, social history Past Medical, Family, Social History Section  Current providers Care team  Current medications Medications  Wt, BP, Ht, BMI Vital signs  Hearing assessment (welcome visit) Hearing/vision  Tobacco, alcohol, illicit drug use History  ADL Nurse Assessment  Depression Screening Nurse Assessment  Cognitive impairment Nurse Assessment  Mini Mental Status Document Flowsheet  Fall Risk Fall/Depression  Home Safety Progress Note  End of Life Planning (welcome visit) Social Documentation  Medicare preventative services Progress Note  Risk factors/conditions needing evaluation/treatment Progress Note  Personalized  health advice Patient Instructions, goals, letter  Diet & Exercise Social Documentation  Emergency Contact Social Documentation  Seat Belts Social Documentation  Sun exposure/protection Social Documentation

## 2014-06-28 ENCOUNTER — Encounter: Payer: Self-pay | Admitting: Family Medicine

## 2014-06-28 ENCOUNTER — Ambulatory Visit (INDEPENDENT_AMBULATORY_CARE_PROVIDER_SITE_OTHER): Payer: Medicare Other | Admitting: Family Medicine

## 2014-06-28 VITALS — BP 104/70 | HR 77 | Temp 98.0°F | Ht 68.0 in | Wt 161.0 lb

## 2014-06-28 DIAGNOSIS — H9209 Otalgia, unspecified ear: Secondary | ICD-10-CM

## 2014-06-28 DIAGNOSIS — H9203 Otalgia, bilateral: Secondary | ICD-10-CM

## 2014-06-28 DIAGNOSIS — H9201 Otalgia, right ear: Secondary | ICD-10-CM | POA: Insufficient documentation

## 2014-06-28 MED ORDER — CARBAMIDE PEROXIDE 6.5 % OT SOLN: 5.0000 [drp] | Freq: Two times a day (BID) | OTIC | Status: DC

## 2014-06-28 NOTE — Assessment & Plan Note (Signed)
Patient with ongoing ear pain b/l. Prescribed medication previously (debrox?) that has worked. Denies fever or drainage. Exam norma.  - debrox PRN  - Auralgan if the debrox doesn't work  - discussed with Dr. Gwendlyn Deutscher

## 2014-06-28 NOTE — Patient Instructions (Signed)
Thank you for coming in,   I am prescribing debrox for your ears. If this doesn't help then we can try a different medication.   Please follow up with me in one month.    Please feel free to call with any questions or concerns at any time, at [phone]. --Dr. Raeford Razor

## 2014-06-28 NOTE — Progress Notes (Signed)
   Subjective:    Patient ID: Megan Kemp, female    DOB: [birth year], 53 y.o.   MRN: [id number]  HPI  [place] is here for b/l ear pain.  Ear pain has been ongoing. She states the pain has lasted her entire life. Has been getting worse the past three month. It is bilateral. She denies any drainage from her ears. No blood and no trauma. She doesn't have recurrent ear infections now or as a child. She denies any drainage and doesn't feel like her allergies have been bad. She hasn't been underwater or swimming for an extended period. She denies any fever or sore throat.  She has been prescribed drops previously for her ears that has worked but she doesn't remember what the medication was.    Current Outpatient Prescriptions on File Prior to Visit  Medication Sig Dispense Refill  . amitriptyline (ELAVIL) 50 MG tablet Take 1 tablet (50 mg total) by mouth at bedtime.  30 tablet  5  . cetirizine (ZYRTEC) 10 MG tablet Take 10 mg by mouth daily.      Marland Kitchen conjugated estrogens (PREMARIN) vaginal cream Place vaginally daily. One applicator full per vagina twice weekly      . divalproex (DEPAKOTE ER) 500 MG 24 hr tablet Take 2 tablets (1,000 mg total) by mouth daily.  60 tablet  11  . docusate sodium (COLACE) 100 MG capsule Take 1 capsule (100 mg total) by mouth daily as needed for mild constipation.  30 capsule  2  . FLUoxetine (PROZAC) 20 MG capsule Take 2 capsules (40 mg total) by mouth 2 (two) times daily.  120 capsule  5  . ibuprofen (ADVIL,MOTRIN) 800 MG tablet Take 1 tablet (800 mg total) by mouth every 8 (eight) hours as needed.  30 tablet  0  . Multiple Vitamin (MULTIVITAMIN) tablet Take 1 tablet by mouth daily.        . pantoprazole (PROTONIX) 40 MG tablet 40 mg.      . cetirizine (ZYRTEC) 10 MG tablet Take 1 tablet (10 mg total) by mouth daily.  30 tablet  1  . nitroGLYCERIN (NITRODUR - DOSED IN MG/24 HR) 0.2 mg/hr CUT PATCH INTO QUARTERS AND PLACE 1 PATCH ON THE LEFT HEEL EACH DAY AFTER  REMOVING OLD PATCH  30 patch  0  . oxyCODONE-acetaminophen (ROXICET) 5-325 MG per tablet Take 1 tablet by mouth every 8 (eight) hours as needed for severe pain.  20 tablet  0  . VOLTAREN 1 % GEL APPLY TOPICALLY 4 TIMES A DAY AS NEEDED  1 Tube  3   No current facility-administered medications on file prior to visit.    Review of Systems See HPI     Objective:   Physical Exam BP 104/70  Pulse 77  Temp(Src) 98 F (36.7 C) (Oral)  Ht 5\' 8"  (1.727 m)  Wt 161 lb (73.029 kg)  BMI 24.49 kg/m2  LMP 05/06/2011 Gen: NAD, alert, cooperative with exam, well-appearing HEENT: NCAT, PERRL, clear conjunctiva, oropharynx clear, supple neck, tympanic membranes intact b/l, Skin: crusted skin lesion without erythema under left axilla        Assessment & Plan:

## 2014-08-01 ENCOUNTER — Other Ambulatory Visit: Payer: Self-pay | Admitting: *Deleted

## 2014-08-01 DIAGNOSIS — G43709 Chronic migraine without aura, not intractable, without status migrainosus: Secondary | ICD-10-CM

## 2014-08-01 DIAGNOSIS — IMO0002 Reserved for concepts with insufficient information to code with codable children: Secondary | ICD-10-CM

## 2014-08-03 MED ORDER — DIVALPROEX SODIUM ER 500 MG PO TB24: 1000.0000 mg | ORAL_TABLET | Freq: Every day | ORAL | Status: DC

## 2014-08-03 MED ORDER — FLUOXETINE HCL 20 MG PO CAPS: 40.0000 mg | ORAL_CAPSULE | Freq: Two times a day (BID) | ORAL | Status: DC

## 2014-08-08 ENCOUNTER — Ambulatory Visit (INDEPENDENT_AMBULATORY_CARE_PROVIDER_SITE_OTHER): Payer: Medicare Other | Admitting: Family Medicine

## 2014-08-08 ENCOUNTER — Encounter: Payer: Self-pay | Admitting: Family Medicine

## 2014-08-08 VITALS — BP 124/83 | HR 78 | Ht 68.0 in | Wt 166.0 lb

## 2014-08-08 DIAGNOSIS — G5603 Carpal tunnel syndrome, bilateral upper limbs: Secondary | ICD-10-CM

## 2014-08-08 DIAGNOSIS — R21 Rash and other nonspecific skin eruption: Secondary | ICD-10-CM

## 2014-08-08 DIAGNOSIS — H9203 Otalgia, bilateral: Secondary | ICD-10-CM

## 2014-08-08 DIAGNOSIS — M25539 Pain in unspecified wrist: Secondary | ICD-10-CM

## 2014-08-08 DIAGNOSIS — M25532 Pain in left wrist: Secondary | ICD-10-CM

## 2014-08-08 DIAGNOSIS — H9209 Otalgia, unspecified ear: Secondary | ICD-10-CM

## 2014-08-08 DIAGNOSIS — M25531 Pain in right wrist: Secondary | ICD-10-CM

## 2014-08-08 DIAGNOSIS — G5600 Carpal tunnel syndrome, unspecified upper limb: Secondary | ICD-10-CM

## 2014-08-08 MED ORDER — TRIAMCINOLONE ACETONIDE 0.1 % EX CREA: 1.0000 "application " | TOPICAL_CREAM | Freq: Two times a day (BID) | CUTANEOUS | Status: DC

## 2014-08-08 MED ORDER — DICLOFENAC SODIUM 75 MG PO TBEC: 75.0000 mg | DELAYED_RELEASE_TABLET | Freq: Two times a day (BID) | ORAL | Status: DC

## 2014-08-08 MED ORDER — CETIRIZINE HCL 10 MG PO TABS: 10.0000 mg | ORAL_TABLET | Freq: Every day | ORAL | Status: DC

## 2014-08-08 NOTE — Progress Notes (Signed)
   Subjective:    Patient ID: [name], female    DOB: Dec 06, 1961, 53 y.o.   MRN: [id number]  HPI  [place] is here for hand pain and rash.   Ears are doing better with debrox. She feels like she has ear fullness.   Rash started about a week ago. She thinks it was a bug bite but didn't see what it was. It has been itchy and painful. She was outside when it occurred.   She has a history of carpal tunnel syndrome. She doesn't want surgery. She uses her braces. She takes amityrtaline. She has numbness and tingling in both hands. This is nothing new and has been going on for a long time. She wakes up at night with hand numbness. She hasn't been wearing her braces at night. She doesn't want an injection either.   Current Outpatient Prescriptions on File Prior to Visit  Medication Sig Dispense Refill  . amitriptyline (ELAVIL) 50 MG tablet Take 1 tablet (50 mg total) by mouth at bedtime.  30 tablet  5  . carbamide peroxide (DEBROX) 6.5 % otic solution Place 5 drops into both ears 2 (two) times daily.  15 mL  0  . cetirizine (ZYRTEC) 10 MG tablet Take 10 mg by mouth daily.      Marland Kitchen conjugated estrogens (PREMARIN) vaginal cream Place vaginally daily. One applicator full per vagina twice weekly      . divalproex (DEPAKOTE ER) 500 MG 24 hr tablet Take 2 tablets (1,000 mg total) by mouth daily.  60 tablet  11  . docusate sodium (COLACE) 100 MG capsule Take 1 capsule (100 mg total) by mouth daily as needed for mild constipation.  30 capsule  2  . FLUoxetine (PROZAC) 20 MG capsule Take 2 capsules (40 mg total) by mouth 2 (two) times daily.  120 capsule  5  . Multiple Vitamin (MULTIVITAMIN) tablet Take 1 tablet by mouth daily.        . nitroGLYCERIN (NITRODUR - DOSED IN MG/24 HR) 0.2 mg/hr CUT PATCH INTO QUARTERS AND PLACE 1 PATCH ON THE LEFT HEEL EACH DAY AFTER REMOVING OLD PATCH  30 patch  0  . pantoprazole (PROTONIX) 40 MG tablet 40 mg.      . VOLTAREN 1 % GEL APPLY TOPICALLY 4 TIMES A DAY AS  NEEDED  1 Tube  3  . cetirizine (ZYRTEC) 10 MG tablet Take 1 tablet (10 mg total) by mouth daily.  30 tablet  1  . ibuprofen (ADVIL,MOTRIN) 800 MG tablet Take 1 tablet (800 mg total) by mouth every 8 (eight) hours as needed.  30 tablet  0  . oxyCODONE-acetaminophen (ROXICET) 5-325 MG per tablet Take 1 tablet by mouth every 8 (eight) hours as needed for severe pain.  20 tablet  0   No current facility-administered medications on file prior to visit.     Review of Systems See HPI     Objective:   Physical Exam BP 124/83  Pulse 78  Ht 5\' 8"  (1.727 m)  Wt 166 lb (75.297 kg)  BMI 25.25 kg/m2  LMP 05/06/2011 Gen: NAD, alert, cooperative with exam, well-appearing HEENT: NCAT, clear conjunctiva, oropharynx clear, supple neck CV: RRR, good S1/S2, no murmur, no edema, capillary refill brisk  MSK: negative tinel's sign. Normal grip strength, normal sensation b/l  Skin: rash on right flank, appears to be but bite that has been scratched         Assessment & Plan:

## 2014-08-08 NOTE — Patient Instructions (Addendum)
Thank you for coming in,   Please check to see when amitrytaline and if you have any gabapentin at home.   I sent in zyrtec for itching and triamcinolone for the rash.   Try the Voltaren for the pain. Please bring all of your medications in next time so I can see what you're taking.   Please follow up with me in 4 weeks if your symptoms are not improved.    Please feel free to call with any questions or concerns at any time, at [phone]. --Dr. Raeford Razor

## 2014-08-10 NOTE — Assessment & Plan Note (Addendum)
Appears to be a bug bite that she has scratched.  - zrytec  - kenalog  - f/u PRN

## 2014-08-10 NOTE — Assessment & Plan Note (Signed)
Improving on current regimen. No changes.

## 2014-08-10 NOTE — Assessment & Plan Note (Signed)
Hand pain b/l most likely related to her history of carpal tunnel syndrome. She hasn't been wearing her braces at night. She refuses injections and surgery.  - currently on amitriptyline (also taking fluoxetine, need to address this at next appt. Should stop this and start gaba for neuropathy. Wellbutrin and fluoxetine would be better option for depression/anxiety) - history of taking gabapentin but not currently. Needs to bring medications to next appt.  - encouraged to wear braces QHS and as available during the day - asking for norco but will give Voltaren

## 2014-10-04 ENCOUNTER — Ambulatory Visit: Payer: Medicare Other | Admitting: Family Medicine

## 2014-10-24 ENCOUNTER — Encounter: Payer: Self-pay | Admitting: Family Medicine

## 2014-10-27 ENCOUNTER — Ambulatory Visit: Payer: Medicare Other | Admitting: Family Medicine

## 2014-11-03 ENCOUNTER — Ambulatory Visit (INDEPENDENT_AMBULATORY_CARE_PROVIDER_SITE_OTHER): Payer: Medicare Other | Admitting: Family Medicine

## 2014-11-03 ENCOUNTER — Encounter: Payer: Self-pay | Admitting: Family Medicine

## 2014-11-03 VITALS — BP 114/75 | HR 72 | Temp 98.0°F | Ht 68.0 in | Wt 167.7 lb

## 2014-11-03 DIAGNOSIS — G8929 Other chronic pain: Secondary | ICD-10-CM

## 2014-11-03 DIAGNOSIS — R52 Pain, unspecified: Secondary | ICD-10-CM

## 2014-11-03 LAB — C-REACTIVE PROTEIN: CRP: <0.5 mg/dL (ref <0.60)

## 2014-11-03 LAB — TSH: TSH: 0.464 u[IU]/mL (ref 0.350–4.500)

## 2014-11-03 LAB — POCT SEDIMENTATION RATE: POCT SED RATE: 10 mm/hr (ref 0–22)

## 2014-11-03 MED ORDER — GABAPENTIN 100 MG PO CAPS: 100.0000 mg | ORAL_CAPSULE | Freq: Every day | ORAL | Status: DC

## 2014-11-03 NOTE — Patient Instructions (Signed)
Thank you for coming in,   Please start taking Gabapentin at night. This may make you drowsy at first. Please let me know how you tolerate it and we may need to go up on your dose if you are not finding relief.   Please bring all of your medications to your next appointment.   Please follow up with me in three months.    Please feel free to call with any questions or concerns at any time, at [phone]. --Dr. Raeford Razor

## 2014-11-04 ENCOUNTER — Telehealth: Payer: Self-pay | Admitting: *Deleted

## 2014-11-04 NOTE — Telephone Encounter (Signed)
-----   Message from Rosemarie Ax, MD sent at 11/04/2014  7:43 AM EST ----- Please call patient and inform all labs are normal. Thanks.

## 2014-11-04 NOTE — Telephone Encounter (Signed)
LVM for patient to call back. ?

## 2014-11-04 NOTE — Telephone Encounter (Signed)
Pt called back and was informed about her labs. jw

## 2014-11-06 NOTE — Progress Notes (Signed)
   Subjective:    Patient ID: Megan Kemp, female    DOB: [birth year], 53 y.o.   MRN: [id number]  HPI  [place] is here for generalized pain.   She has pain in her hands, legs, knees, elbow and shoulders.  This has been increasing for the past 2-3 weeks. She isn't taking anything for it. She is requesting a narcotic prescription. Nothing seems to help the pain. She rubs it to decrease it.  She quit taking amitriptyline because her GI doctor told her to. It is worse when she moves. It is stabbing in nature. It is the same throughout the day. These are the same symptoms she had 2.5 years ago. Her sister has lupus and her niece does as well. She denies any fevers, chills, or night sweats.    Current Outpatient Prescriptions on File Prior to Visit  Medication Sig Dispense Refill  . amitriptyline (ELAVIL) 50 MG tablet Take 1 tablet (50 mg total) by mouth at bedtime. 30 tablet 5  . carbamide peroxide (DEBROX) 6.5 % otic solution Place 5 drops into both ears 2 (two) times daily. 15 mL 0  . cetirizine (ZYRTEC) 10 MG tablet Take 1 tablet (10 mg total) by mouth daily. 90 tablet 1  . conjugated estrogens (PREMARIN) vaginal cream Place vaginally daily. One applicator full per vagina twice weekly    . diclofenac (VOLTAREN) 75 MG EC tablet Take 1 tablet (75 mg total) by mouth 2 (two) times daily. 30 tablet 1  . divalproex (DEPAKOTE ER) 500 MG 24 hr tablet Take 2 tablets (1,000 mg total) by mouth daily. 60 tablet 11  . docusate sodium (COLACE) 100 MG capsule Take 1 capsule (100 mg total) by mouth daily as needed for mild constipation. 30 capsule 2  . FLUoxetine (PROZAC) 20 MG capsule Take 2 capsules (40 mg total) by mouth 2 (two) times daily. 120 capsule 5  . ibuprofen (ADVIL,MOTRIN) 800 MG tablet Take 1 tablet (800 mg total) by mouth every 8 (eight) hours as needed. 30 tablet 0  . Multiple Vitamin (MULTIVITAMIN) tablet Take 1 tablet by mouth daily.      . nitroGLYCERIN (NITRODUR - DOSED IN MG/24 HR)  0.2 mg/hr CUT PATCH INTO QUARTERS AND PLACE 1 PATCH ON THE LEFT HEEL EACH DAY AFTER REMOVING OLD PATCH 30 patch 0  . oxyCODONE-acetaminophen (ROXICET) 5-325 MG per tablet Take 1 tablet by mouth every 8 (eight) hours as needed for severe pain. 20 tablet 0  . pantoprazole (PROTONIX) 40 MG tablet 40 mg.    . triamcinolone cream (KENALOG) 0.1 % Apply 1 application topically 2 (two) times daily. 30 g 0  . VOLTAREN 1 % GEL APPLY TOPICALLY 4 TIMES A DAY AS NEEDED 1 Tube 3   No current facility-administered medications on file prior to visit.    Review of Systems See HPI     Objective:   Physical Exam BP 114/75 mmHg  Pulse 72  Temp(Src) 98 F (36.7 C) (Oral)  Ht 5\' 8"  (1.727 m)  Wt 167 lb 11.2 oz (76.068 kg)  BMI 25.50 kg/m2  LMP 05/06/2011 Gen: NAD, alert, cooperative with exam,  MSK: palpation of knees, thighs, hands, wrists, elbows, shoulders and trapezius causes pain. Strength and sensation intact, Neurovascularly intact, no deformities  Neuro: no gross deficits.  Psych: poor insight, alert and oriented        Assessment & Plan:

## 2014-11-06 NOTE — Assessment & Plan Note (Signed)
Could be arthritis or more fibromyalgia in nature. Not taking amitriptyline anymore.  - gabapentin 100 mg QHS and titrate up as necessary.  - could consider cymbalta and stopping prozac in future  - PHQ-9 at f/u  - CRP, ESR an TSH are all normal

## 2014-11-11 ENCOUNTER — Other Ambulatory Visit: Payer: Self-pay | Admitting: Family Medicine

## 2014-11-11 DIAGNOSIS — Z1231 Encounter for screening mammogram for malignant neoplasm of breast: Secondary | ICD-10-CM

## 2014-12-08 ENCOUNTER — Ambulatory Visit (HOSPITAL_COMMUNITY)
Admission: RE | Admit: 2014-12-08 | Discharge: 2014-12-08 | Disposition: A | Discharge status: Home / Self Care | Payer: Medicare Other | Source: Ambulatory Visit | Attending: Family Medicine | Admitting: Family Medicine

## 2014-12-08 DIAGNOSIS — Z1231 Encounter for screening mammogram for malignant neoplasm of breast: Secondary | ICD-10-CM | POA: Diagnosis not present

## 2015-02-17 ENCOUNTER — Ambulatory Visit: Payer: Self-pay | Admitting: Family Medicine

## 2015-03-02 ENCOUNTER — Encounter: Payer: Self-pay | Admitting: Family Medicine

## 2015-03-02 ENCOUNTER — Ambulatory Visit (INDEPENDENT_AMBULATORY_CARE_PROVIDER_SITE_OTHER): Payer: Medicare HMO | Admitting: Family Medicine

## 2015-03-02 ENCOUNTER — Ambulatory Visit
Admission: RE | Admit: 2015-03-02 | Discharge: 2015-03-02 | Disposition: A | Discharge status: Home / Self Care | Payer: Medicare HMO | Source: Ambulatory Visit | Attending: Family Medicine | Admitting: Family Medicine

## 2015-03-02 VITALS — BP 116/72 | HR 86 | Ht 68.0 in | Wt 181.0 lb

## 2015-03-02 DIAGNOSIS — M79641 Pain in right hand: Secondary | ICD-10-CM

## 2015-03-02 DIAGNOSIS — M79642 Pain in left hand: Principal | ICD-10-CM

## 2015-03-02 DIAGNOSIS — K219 Gastro-esophageal reflux disease without esophagitis: Secondary | ICD-10-CM | POA: Diagnosis not present

## 2015-03-02 DIAGNOSIS — R52 Pain, unspecified: Secondary | ICD-10-CM | POA: Diagnosis not present

## 2015-03-02 DIAGNOSIS — G8929 Other chronic pain: Secondary | ICD-10-CM

## 2015-03-02 LAB — RHEUMATOID FACTOR: Rheumatoid fact SerPl-aCnc: <10 [IU]/mL

## 2015-03-02 MED ORDER — ACETAMINOPHEN 500 MG PO TABS: 1000.0000 mg | ORAL_TABLET | Freq: Two times a day (BID) | ORAL | Status: DC | PRN

## 2015-03-02 MED ORDER — GABAPENTIN 100 MG PO CAPS: 100.0000 mg | ORAL_CAPSULE | Freq: Three times a day (TID) | ORAL | Status: DC

## 2015-03-02 MED ORDER — PANTOPRAZOLE SODIUM 40 MG PO TBEC: 40.0000 mg | DELAYED_RELEASE_TABLET | Freq: Every day | ORAL | Status: DC

## 2015-03-02 NOTE — Progress Notes (Signed)
Patient ID: Megan Kemp, female   DOB: November 20, 1961, 54 y.o.   MRN: [id number]  HPI:  Pt presents to follow-up on hand cramping.  This has gone on for 5 years. She wears cockup splints every night. She was taking Tylenol and ibuprofen, however now is just doing extra strength Tylenol. She requests a refill of this today. She reports a history of carpal tunnel. She does not want surgery. She has been using over-the-counter muscle rub on her hands. She thinks her hands are somewhat weak. They're stiff in the morning. She also has a history of depression. Denies SI/HI.  Has history of GERD. Needs refill on her Protonix.  ROS: See HPI.  [place]: Chronic generalized pain, carpal tunnel syndrome, depression, generalized anxiety disorder, migraines  PHYSICAL EXAM: BP 116/72 mmHg  Pulse 86  Ht 5\' 8"  (1.727 m)  Wt 181 lb (82.101 kg)  BMI 27.53 kg/m2  LMP 05/06/2011 Gen: NAD, pleasant, cooperative HEENT: NCAT Lungs: NWOB Neuro: ambulates with bilat walking sticks.  Ext: full strength bilat lower ext bilat hands with degenerative changes. R MCP joint and some bilat PIP joints most affected No thenar wasting decr strength with grip bilat but question pt's effort 2+ radial pulses bilat Psych: tearful when discussing depression, full range of affect, well groomed, speech normal in rate and volume, normal eye contact  ASSESSMENT/PLAN:  Chronic generalized pain Primarily focused on her hand pain today. No muscle wasting. Grip is somewhat weak, but patient gave poor effort with this. New Rx sent in for gabapentin as patient never got this. Also refilled extra strength Tylenol I will also check x-rays of her bilateral hands to evaluate for arthritic changes.  She's had a partial serologic workup previously. I will add RF and ANA lab work today. Follow up with PCP Dr. Raeford Razor in one month. Consider possibly switching to Cymbalta for better pain control at that time.   GERD (gastroesophageal  reflux disease) Protonix refilled today per patient request.    FOLLOW UP: F/u in one month with PCP for generalized pain  [place] J. Ardelia Mems, [place]

## 2015-03-02 NOTE — Patient Instructions (Signed)
Nice to meet you today I sent in prescriptions for Protonix, Tylenol, and gabapentin. Make sure use caution when taking the gabapentin as it might make you sleepy We're checking some blood work and x-rays of your hands. Follow up with Dr. Raeford Razor in one month. You can talk to him at that time about possibly switching to Cymbalta  Be well, Dr. Ardelia Mems

## 2015-03-03 LAB — ANA: Anti Nuclear Antibody(ANA): NEGATIVE

## 2015-03-07 DIAGNOSIS — K219 Gastro-esophageal reflux disease without esophagitis: Secondary | ICD-10-CM | POA: Insufficient documentation

## 2015-03-07 NOTE — Progress Notes (Signed)
I was preceptor the day of this visit.   

## 2015-03-07 NOTE — Assessment & Plan Note (Signed)
Protonix refilled today per patient request.

## 2015-03-07 NOTE — Assessment & Plan Note (Addendum)
Primarily focused on her hand pain today. No muscle wasting. Grip is somewhat weak, but patient gave poor effort with this. New Rx sent in for gabapentin as patient never got this. Also refilled extra strength Tylenol I will also check x-rays of her bilateral hands to evaluate for arthritic changes.  She's had a partial serologic workup previously. I will add RF and ANA lab work today. Follow up with PCP Dr. Raeford Razor in one month. Consider possibly switching to Cymbalta for better pain control at that time.

## 2015-03-09 ENCOUNTER — Encounter: Payer: Self-pay | Admitting: Family Medicine

## 2015-04-21 ENCOUNTER — Telehealth: Payer: Self-pay | Admitting: Family Medicine

## 2015-04-21 NOTE — Telephone Encounter (Signed)
Spoke with patient and informed her of below. She is coming to see Dr. Raeford Razor Tuesday 04/25/2015 @ 10:15am

## 2015-04-21 NOTE — Telephone Encounter (Signed)
Spoke with patient in regards to her elbow othosis request.  She stated that she has pain in her left elbow and unable to extend it.  I have not seen her for this problem but her previous provider saw her in June 2015.  No imaging of chart review. Unsure if she has tried an injection to reduce pain if that is the problem. Complicated with depression and chronic pain syndrome.    Upon reviewing the request for othosis, it needs statement of condition in medical records. She will need to be seen by me before this request can be filled.   Rosemarie Ax, MD PGY-2, [facility] 04/21/2015, 8:58 AM

## 2015-04-24 ENCOUNTER — Telehealth: Payer: Self-pay | Admitting: Family Medicine

## 2015-04-24 NOTE — Telephone Encounter (Signed)
Needing authorization to fill prescription for Lidocaine Ointment for the patient / Megan Kemp, ASA

## 2015-04-24 NOTE — Telephone Encounter (Signed)
I cannot authorize her medication until I see her in clinic. She is scheduled to be seen tomorrow.   Rosemarie Ax, MD PGY-2, [facility] 04/24/2015, 1:42 PM

## 2015-04-25 ENCOUNTER — Ambulatory Visit (INDEPENDENT_AMBULATORY_CARE_PROVIDER_SITE_OTHER): Payer: Medicare HMO | Admitting: Family Medicine

## 2015-04-25 ENCOUNTER — Encounter: Payer: Self-pay | Admitting: Family Medicine

## 2015-04-25 VITALS — BP 111/74 | HR 77 | Temp 97.4°F | Ht 68.0 in | Wt 172.9 lb

## 2015-04-25 DIAGNOSIS — R52 Pain, unspecified: Secondary | ICD-10-CM

## 2015-04-25 DIAGNOSIS — F32A Depression, unspecified: Secondary | ICD-10-CM

## 2015-04-25 DIAGNOSIS — F411 Generalized anxiety disorder: Secondary | ICD-10-CM

## 2015-04-25 DIAGNOSIS — G8929 Other chronic pain: Secondary | ICD-10-CM

## 2015-04-25 DIAGNOSIS — F329 Major depressive disorder, single episode, unspecified: Secondary | ICD-10-CM

## 2015-04-25 NOTE — Progress Notes (Signed)
Subjective:    Patient ID: Megan Kemp, female    DOB: [birth year], 54 y.o.   MRN: [id number]  HPI  Megan Kemp is here for chronic pain.  05/31/12: cervical spine x-ray Relatively preserved cervical lordosis. Normal prevertebral soft tissue contours. Cervicothoracic junction alignment is within normal limits. Relatively preserved disc spaces, but bulky endplate spurring at C5. AP alignment within normal limits.C1-C2 alignment and odontoid within normal limits. 02/09/15 Hip xr: Mild degenerative changes of the hip joints are noted bilaterally 3/10/ 16 Hand x-ray: mild OA   Chronic pain: pain is occuring thourougt her body.  There is a nagging, stabbing pain. Her hands and elbow get cramped up and feels like it needs to pop off.  Heating gel from the store seems to help when she applies it. Not taking tylenol or ibuprofen seems to worsen her pain.  Pain is worse today due to the weather.  She doesn't work and stays at home during the day. She tries exercising which seems to help her pain.  No recent falls or trauma.   She has a hx of migraines and seen at [facility] and will be seen on June 12, 2015. Also seen there for chronic pelvic pain s/p hysterectomy with BSO on 02/10/13.    Current Outpatient Prescriptions on File Prior to Visit  Medication Sig Dispense Refill  . acetaminophen (TYLENOL) 500 MG tablet Take 2 tablets (1,000 mg total) by mouth every 12 (twelve) hours as needed. 60 tablet 1  . carbamide peroxide (DEBROX) 6.5 % otic solution Place 5 drops into both ears 2 (two) times daily. 15 mL 0  . cetirizine (ZYRTEC) 10 MG tablet Take 1 tablet (10 mg total) by mouth daily. 90 tablet 1  . diclofenac (VOLTAREN) 75 MG EC tablet Take 1 tablet (75 mg total) by mouth 2 (two) times daily. 30 tablet 1  . divalproex (DEPAKOTE ER) 500 MG 24 hr tablet Take 2 tablets (1,000 mg total) by mouth daily. 60 tablet 11  . docusate sodium (COLACE) 100 MG capsule Take 1 capsule (100 mg  total) by mouth daily as needed for mild constipation. 30 capsule 2  . FLUoxetine (PROZAC) 20 MG capsule Take 2 capsules (40 mg total) by mouth 2 (two) times daily. 120 capsule 5  . gabapentin (NEURONTIN) 100 MG capsule Take 1 capsule (100 mg total) by mouth 3 (three) times daily. 90 capsule 1  . ibuprofen (ADVIL,MOTRIN) 800 MG tablet Take 1 tablet (800 mg total) by mouth every 8 (eight) hours as needed. 30 tablet 0  . Multiple Vitamin (MULTIVITAMIN) tablet Take 1 tablet by mouth daily.      . nitroGLYCERIN (NITRODUR - DOSED IN MG/24 HR) 0.2 mg/hr CUT PATCH INTO QUARTERS AND PLACE 1 PATCH ON THE LEFT HEEL EACH DAY AFTER REMOVING OLD PATCH 30 patch 0  . pantoprazole (PROTONIX) 40 MG tablet Take 1 tablet (40 mg total) by mouth daily. 30 tablet 1  . triamcinolone cream (KENALOG) 0.1 % Apply 1 application topically 2 (two) times daily. 30 g 0  . VOLTAREN 1 % GEL APPLY TOPICALLY 4 TIMES A DAY AS NEEDED 1 Tube 3   No current facility-administered medications on file prior to visit.    SHx: no tobacco or EtOH   Health Maintenance: needs HIV screening.    Review of Systems See HPI     Objective:   Physical Exam BP 111/74 mmHg  Pulse 77  Temp(Src) 97.4 F (36.3 C) (Oral)  Ht 5'  8" (1.727 m)  Wt 172 lb 14.4 oz (78.427 kg)  BMI 26.30 kg/m2  LMP 05/06/2011 Gen: NAD, alert, cooperative with exam, well-appearing Skin: no rashes, normal turgor  Neuro: walking with a pair of walking sticks but otherwise gait normal. Neurovascularly intact  MSK: normal grip strength, 5/5 strength in UE/LE b/l, normal sensation in UE/LE b/l, no gross deformity or muscle wasting, no erythema or ecchymosis observed, no        Assessment & Plan:

## 2015-04-25 NOTE — Patient Instructions (Signed)
Thank you for coming in,   I will call you with the changes in your medications once I speak with the pharmacist.    I want to make sure that you can afford these medication changes.   Please bring all of your medications with you to each visit.    Please feel free to call with any questions or concerns at any time, at [phone]. --Dr. Raeford Razor

## 2015-04-26 ENCOUNTER — Encounter: Payer: Self-pay | Admitting: Family Medicine

## 2015-04-26 MED ORDER — GABAPENTIN 300 MG PO CAPS: 300.0000 mg | ORAL_CAPSULE | Freq: Three times a day (TID) | ORAL | Status: DC

## 2015-04-26 NOTE — Assessment & Plan Note (Addendum)
Pain occuring through out her body today. Mainly in her left foot, left elbow and both hands.  Serologic work up has been neg. Imaging has yielded some mild OA.  Believe that this is fibromyalgia.  Hx of depression and anxiety: taking Prozac  No concerns on physical exam today.  Has tried prescribed amitriptyline in the past but didn't take it for unknown reason  - will increase her gabapentin to 300 mg TID  - will write for topical lidocaine  - I have received several requests for different othosis devices. These will not improve her pain but hinder her ROM which would could exacerbate her pain  - taking prozac for depression. If no improvement with the increase in gabapentin. May need to add another agent.  PHQ-9 8 04/25/15 GAD-7 13 04/25/15  - no further imaging or lab testing at this time.  - will f/u in a month  - discussed with Dr. Lindell Noe.

## 2015-05-22 ENCOUNTER — Emergency Department (HOSPITAL_COMMUNITY)
Admission: EM | Admit: 2015-05-22 | Discharge: 2015-05-22 | Disposition: A | Discharge status: Home / Self Care | Payer: Medicare HMO | Attending: Emergency Medicine | Admitting: Emergency Medicine

## 2015-05-22 ENCOUNTER — Encounter (HOSPITAL_COMMUNITY): Payer: Self-pay | Admitting: Emergency Medicine

## 2015-05-22 DIAGNOSIS — Y9389 Activity, other specified: Secondary | ICD-10-CM | POA: Diagnosis not present

## 2015-05-22 DIAGNOSIS — Z8742 Personal history of other diseases of the female genital tract: Secondary | ICD-10-CM | POA: Insufficient documentation

## 2015-05-22 DIAGNOSIS — S80862A Insect bite (nonvenomous), left lower leg, initial encounter: Secondary | ICD-10-CM | POA: Insufficient documentation

## 2015-05-22 DIAGNOSIS — G43909 Migraine, unspecified, not intractable, without status migrainosus: Secondary | ICD-10-CM | POA: Insufficient documentation

## 2015-05-22 DIAGNOSIS — Y9289 Other specified places as the place of occurrence of the external cause: Secondary | ICD-10-CM | POA: Insufficient documentation

## 2015-05-22 DIAGNOSIS — Y998 Other external cause status: Secondary | ICD-10-CM | POA: Insufficient documentation

## 2015-05-22 DIAGNOSIS — F410 Panic disorder [episodic paroxysmal anxiety] without agoraphobia: Secondary | ICD-10-CM | POA: Diagnosis not present

## 2015-05-22 DIAGNOSIS — K219 Gastro-esophageal reflux disease without esophagitis: Secondary | ICD-10-CM | POA: Diagnosis not present

## 2015-05-22 DIAGNOSIS — Z79899 Other long term (current) drug therapy: Secondary | ICD-10-CM | POA: Insufficient documentation

## 2015-05-22 DIAGNOSIS — Z862 Personal history of diseases of the blood and blood-forming organs and certain disorders involving the immune mechanism: Secondary | ICD-10-CM | POA: Diagnosis not present

## 2015-05-22 DIAGNOSIS — W57XXXA Bitten or stung by nonvenomous insect and other nonvenomous arthropods, initial encounter: Secondary | ICD-10-CM | POA: Diagnosis not present

## 2015-05-22 DIAGNOSIS — Z91040 Latex allergy status: Secondary | ICD-10-CM | POA: Insufficient documentation

## 2015-05-22 DIAGNOSIS — G894 Chronic pain syndrome: Secondary | ICD-10-CM | POA: Diagnosis not present

## 2015-05-22 MED ORDER — BACITRACIN ZINC 500 UNIT/GM EX OINT: 1.0000 "application " | TOPICAL_OINTMENT | Freq: Two times a day (BID) | CUTANEOUS | Status: DC

## 2015-05-22 MED ORDER — BACITRACIN 500 UNIT/GM EX OINT
1.0000 "application " | TOPICAL_OINTMENT | Freq: Two times a day (BID) | CUTANEOUS | Status: DC
  Administered 2015-05-22: 1 via TOPICAL

## 2015-05-22 MED ORDER — DIPHENHYDRAMINE HCL 25 MG PO CAPS
25.0000 mg | ORAL_CAPSULE | Freq: Once | ORAL | Status: AC
  Administered 2015-05-22: 25 mg via ORAL
  Filled 2015-05-22: qty 1

## 2015-05-22 MED ORDER — DIPHENHYDRAMINE HCL 25 MG PO TABS: 25.0000 mg | ORAL_TABLET | Freq: Four times a day (QID) | ORAL | Status: DC | PRN

## 2015-05-22 NOTE — ED Notes (Signed)
Pt reports tick attached to left lower leg that she removed herself.  Now reports full body itching and shows small reddened area on left medial lower leg.  Denies difficulty in breathing

## 2015-05-22 NOTE — Discharge Instructions (Signed)
Read the information below.  Use the prescribed medication as directed.  Please discuss all new medications with your pharmacist.  You may return to the Emergency Department at any time for worsening condition or any new symptoms that concern you.   If you develop redness, swelling, pus draining from the wound, or fevers greater than 100.4, return to the ER immediately for a recheck.     Tick Bite Information Ticks are insects that attach themselves to the skin and draw blood for food. There are various types of ticks. Common types include wood ticks and deer ticks. Most ticks live in shrubs and grassy areas. Ticks can climb onto your body when you make contact with leaves or grass where the tick is waiting. The most common places on the body for ticks to attach themselves are the scalp, neck, armpits, waist, and groin. Most tick bites are harmless, but sometimes ticks carry germs that cause diseases. These germs can be spread to a person during the tick's feeding process. The chance of a disease spreading through a tick bite depends on:   The type of tick.  Time of year.   How long the tick is attached.   Geographic location.  HOW CAN YOU PREVENT TICK BITES? Take these steps to help prevent tick bites when you are outdoors:  Wear protective clothing. Long sleeves and long pants are best.   Wear white clothes so you can see ticks more easily.  Tuck your pant legs into your socks.   If walking on a trail, stay in the middle of the trail to avoid brushing against bushes.  Avoid walking through areas with long grass.  Put insect repellent on all exposed skin and along boot tops, pant legs, and sleeve cuffs.   Check clothing, hair, and skin repeatedly and before going inside.   Brush off any ticks that are not attached.  Take a shower or bath as soon as possible after being outdoors.  WHAT IS THE PROPER WAY TO REMOVE A TICK? Ticks should be removed as soon as possible to help  prevent diseases caused by tick bites.  If latex gloves are available, put them on before trying to remove a tick.   Using fine-point tweezers, grasp the tick as close to the skin as possible. You may also use curved forceps or a tick removal tool. Grasp the tick as close to its head as possible. Avoid grasping the tick on its body.  Pull gently with steady upward pressure until the tick lets go. Do not twist the tick or jerk it suddenly. This may break off the tick's head or mouth parts.  Do not squeeze or crush the tick's body. This could force disease-carrying fluids from the tick into your body.   After the tick is removed, wash the bite area and your hands with soap and water or other disinfectant such as alcohol.  Apply a small amount of antiseptic cream or ointment to the bite site.   Wash and disinfect any instruments that were used.  Do not try to remove a tick by applying a hot match, petroleum jelly, or fingernail polish to the tick. These methods do not work and may increase the chances of disease being spread from the tick bite.  WHEN SHOULD YOU SEEK MEDICAL CARE? Contact your health care provider if you are unable to remove a tick from your skin or if a part of the tick breaks off and is stuck in the skin.  After  a tick bite, you need to be aware of signs and symptoms that could be related to diseases spread by ticks. Contact your health care provider if you develop any of the following in the days or weeks after the tick bite:  Unexplained fever.  Rash. A circular rash that appears days or weeks after the tick bite may indicate the possibility of Lyme disease. The rash may resemble a target with a bull's-eye and may occur at a different part of your body than the tick bite.  Redness and swelling in the area of the tick bite.   Tender, swollen lymph glands.   Diarrhea.   Weight loss.   Cough.   Fatigue.   Muscle, joint, or bone pain.   Abdominal pain.    Headache.   Lethargy or a change in your level of consciousness.  Difficulty walking or moving your legs.   Numbness in the legs.   Paralysis.  Shortness of breath.   Confusion.   Repeated vomiting.  Document Released: 12/06/2000 Document Revised: 09/29/2013 Document Reviewed: 05/19/2013 [facility] Patient Information 2015 [place], [place]. This information is not intended to replace advice given to you by your health care provider. Make sure you discuss any questions you have with your health care provider.  Insect Bite Mosquitoes, flies, fleas, bedbugs, and many other insects can bite. Insect bites are different from insect stings. A sting is when venom is injected into the skin. Some insect bites can transmit infectious diseases. SYMPTOMS  Insect bites usually turn red, swell, and itch for 2 to 4 days. They often go away on their own. TREATMENT  Your caregiver may prescribe antibiotic medicines if a bacterial infection develops in the bite. HOME CARE INSTRUCTIONS  Do not scratch the bite area.  Keep the bite area clean and dry. Wash the bite area thoroughly with soap and water.  Put ice or cool compresses on the bite area.  Put ice in a plastic bag.  Place a towel between your skin and the bag.  Leave the ice on for 20 minutes, 4 times a day for the first 2 to 3 days, or as directed.  You may apply a baking soda paste, cortisone cream, or calamine lotion to the bite area as directed by your caregiver. This can help reduce itching and swelling.  Only take over-the-counter or prescription medicines as directed by your caregiver.  If you are given antibiotics, take them as directed. Finish them even if you start to feel better. You may need a tetanus shot if:  You cannot remember when you had your last tetanus shot.  You have never had a tetanus shot.  The injury broke your skin. If you get a tetanus shot, your arm may swell, get red, and feel warm to the  touch. This is common and not a problem. If you need a tetanus shot and you choose not to have one, there is a rare chance of getting tetanus. Sickness from tetanus can be serious. SEEK IMMEDIATE MEDICAL CARE IF:   You have increased pain, redness, or swelling in the bite area.  You see a red line on the skin coming from the bite.  You have a fever.  You have joint pain.  You have a headache or neck pain.  You have unusual weakness.  You have a rash.  You have chest pain or shortness of breath.  You have abdominal pain, nausea, or vomiting.  You feel unusually tired or sleepy. MAKE SURE YOU:  Understand these instructions.  Will watch your condition.  Will get help right away if you are not doing well or get worse. Document Released: 01/16/2005 Document Revised: 03/02/2012 Document Reviewed: 07/10/2011 [facility] Patient Information 2015 [place], [place]. This information is not intended to replace advice given to you by your health care provider. Make sure you discuss any questions you have with your health care provider.
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
## 2015-06-19 ENCOUNTER — Ambulatory Visit: Payer: Medicare HMO | Admitting: Family Medicine

## 2015-07-08 ENCOUNTER — Other Ambulatory Visit: Payer: Self-pay | Admitting: Family Medicine

## 2015-07-16 ENCOUNTER — Encounter (HOSPITAL_COMMUNITY): Payer: Self-pay | Admitting: *Deleted

## 2015-07-16 ENCOUNTER — Emergency Department (INDEPENDENT_AMBULATORY_CARE_PROVIDER_SITE_OTHER)
Admission: EM | Admit: 2015-07-16 | Discharge: 2015-07-16 | Disposition: A | Discharge status: Home / Self Care | Payer: Medicare HMO | Source: Home / Self Care | Attending: Family Medicine | Admitting: Family Medicine

## 2015-07-16 ENCOUNTER — Emergency Department (INDEPENDENT_AMBULATORY_CARE_PROVIDER_SITE_OTHER): Payer: Medicare HMO

## 2015-07-16 DIAGNOSIS — M7071 Other bursitis of hip, right hip: Secondary | ICD-10-CM | POA: Diagnosis not present

## 2015-07-16 DIAGNOSIS — R52 Pain, unspecified: Secondary | ICD-10-CM

## 2015-07-16 MED ORDER — MELOXICAM 7.5 MG PO TABS: 7.5000 mg | ORAL_TABLET | Freq: Two times a day (BID) | ORAL | Status: DC

## 2015-07-16 NOTE — ED Notes (Signed)
Denies injury.  C/O intermittent right hip pain x 5-6 mo - PCP gave her some medicine for pain.  Over past month, pain has gotten worse.  This week pain has increased even more, radiating down RLE.  Denies any parasthesias.
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
## 2015-07-16 NOTE — Discharge Instructions (Signed)
Use ice pack, medicine and crutches for pain and see orthopedist for recheck if pain continues.

## 2015-07-27 ENCOUNTER — Ambulatory Visit (INDEPENDENT_AMBULATORY_CARE_PROVIDER_SITE_OTHER): Payer: Medicare HMO | Admitting: Family Medicine

## 2015-07-27 VITALS — BP 109/78 | HR 73 | Temp 98.1°F | Ht 68.0 in

## 2015-07-27 DIAGNOSIS — M5431 Sciatica, right side: Secondary | ICD-10-CM | POA: Diagnosis not present

## 2015-07-27 MED ORDER — PREDNISONE 10 MG PO TABS: ORAL_TABLET | ORAL | Status: DC

## 2015-07-27 NOTE — Progress Notes (Signed)
Subjective:    Megan Kemp - 55 y.o. female MRN [id number]  Date of birth: [birth year]  HPI  Megan Kemp is here for right hip pain.   Patient was seen in the UC for right hip pain. She was dx with greater trochanteric bursitis and imaging was no acute findings.  She was referred to [facility] (orthopedic surgery) but she says she cannot go there since she owes them money.    Location: reports that it is occuring in the back of her right buttock and radiating down her right leg.  Pain started: long term  Pain is: sharp  Severity: 10/10  Medications tried: mobic, gabapentin,  Recent trauma: no Similar pain previously: yes   Symptoms Redness: no Swelling:no Fever: no Weakness: no Weight loss: no Rash: no  Health Maintenance:  Health Maintenance Due  Topic Date Due  . HIV Screening  04/27/1977  . PAP SMEAR  06/16/2015  . INFLUENZA VACCINE  07/24/2015    -  reports that she has never smoked. She has never used smokeless tobacco. - Review of Systems: Per HPI. All other systems reviewed and are negative. - Past Medical History: Patient Active Problem List   Diagnosis Date Noted  . GERD (gastroesophageal reflux disease) 03/07/2015  . Hx of colonic polyps 03/21/2014  . Hematometra 12/04/2012  . Chronic generalized pain 09/11/2012  . Carpal tunnel syndrome, bilateral 06/10/2012  . Generalized anxiety disorder 05/16/2011  . Dysphagia 04/18/2011  . Migraine 04/08/2011  . SYSTOLIC MURMUR 84/13/2440  . Depression 10/17/2010  . Sickle-cell trait 09/28/2009   - Medications: reviewed and updated Current Outpatient Prescriptions  Medication Sig Dispense Refill  . acetaminophen (TYLENOL) 500 MG tablet Take 2 tablets (1,000 mg total) by mouth every 12 (twelve) hours as needed. 60 tablet 1  . bacitracin ointment Apply 1 application topically 2 (two) times daily. 120 g 0  . carbamide peroxide (DEBROX) 6.5 % otic solution Place 5 drops into both ears 2 (two) times daily.  (Patient not taking: Reported on 04/25/2015) 15 mL 0  . cetirizine (ZYRTEC) 10 MG tablet Take 1 tablet (10 mg total) by mouth daily. (Patient not taking: Reported on 04/25/2015) 90 tablet 1  . diclofenac (VOLTAREN) 75 MG EC tablet Take 1 tablet (75 mg total) by mouth 2 (two) times daily. (Patient not taking: Reported on 04/25/2015) 30 tablet 1  . diphenhydrAMINE (BENADRYL) 25 MG tablet Take 1 tablet (25 mg total) by mouth every 6 (six) hours as needed for itching or allergies. 20 tablet 0  . divalproex (DEPAKOTE ER) 500 MG 24 hr tablet Take 2 tablets (1,000 mg total) by mouth daily. 60 tablet 11  . docusate sodium (COLACE) 100 MG capsule Take 1 capsule (100 mg total) by mouth daily as needed for mild constipation. 30 capsule 2  . FLUoxetine (PROZAC) 20 MG capsule Take 2 capsules (40 mg total) by mouth 2 (two) times daily. 120 capsule 5  . gabapentin (NEURONTIN) 300 MG capsule Take 1 capsule (300 mg total) by mouth 3 (three) times daily. 90 capsule 3  . ibuprofen (ADVIL,MOTRIN) 800 MG tablet Take 1 tablet (800 mg total) by mouth every 8 (eight) hours as needed. (Patient not taking: Reported on 04/25/2015) 30 tablet 0  . meloxicam (MOBIC) 7.5 MG tablet Take 1 tablet (7.5 mg total) by mouth 2 (two) times daily after a meal. 30 tablet 1  . Multiple Vitamin (MULTIVITAMIN) tablet Take 1 tablet by mouth daily.      Marland Kitchen NAPROXEN  PO Take by mouth.    . nitroGLYCERIN (NITRODUR - DOSED IN MG/24 HR) 0.2 mg/hr CUT PATCH INTO QUARTERS AND PLACE 1 PATCH ON THE LEFT HEEL EACH DAY AFTER REMOVING OLD PATCH (Patient not taking: Reported on 04/25/2015) 30 patch 0  . pantoprazole (PROTONIX) 40 MG tablet TAKE 1 TABLET (40 MG TOTAL) BY MOUTH DAILY. 30 tablet 1  . triamcinolone cream (KENALOG) 0.1 % Apply 1 application topically 2 (two) times daily. (Patient not taking: Reported on 04/25/2015) 30 g 0  . VOLTAREN 1 % GEL APPLY TOPICALLY 4 TIMES A DAY AS NEEDED 1 Tube 3   No current facility-administered medications for this visit.      Review of Systems See HPI     Objective:   Physical Exam BP 109/78 mmHg  Pulse 73  Temp(Src) 98.1 F (36.7 C) (Oral)  Ht 5\' 8"  (1.727 m)  LMP 05/06/2011 Gen: NAD, alert, cooperative with exam,  MSK: ambulating with crutches  Back:  Appearance: symmetric, no ecchymosis  Palpation: TTP along greater trochanter on right, glute medius and maximus, IT band  Hip:  Appearance: symmetric  Palpation: tenderness of greater trochanter Rotation Reduced: no reduced internal or external rotation Neuro: Strength  knee extension 5/5, knee flexion 5/5, dorsiflexion 5/5, plantar flexion 5/5 Reflexes: patella 2/2 Bilateral  Achilles 2/2 Bilateral Straight Leg Raise: negative Sensation to light touch intact: yes Neurovascularly intact                Assessment & Plan:

## 2015-07-27 NOTE — Patient Instructions (Signed)
Thank you for coming in,   I have given you a prednisone taper.  STOP taking the mobic   Prednisone may make you jittery, have an increased appetite or have some leg swelling.  Please call if you don't tolerate the medication.   I have also placed a referral to Physical therapy.   Please bring all of your medications with you to each visit.    Please feel free to call with any questions or concerns at any time, at [phone]. --Dr. Raeford Razor

## 2015-07-28 DIAGNOSIS — M5430 Sciatica, unspecified side: Secondary | ICD-10-CM | POA: Insufficient documentation

## 2015-07-28 NOTE — Assessment & Plan Note (Addendum)
Patient is reporting symptoms of sciatica. Possible for acute on chronic flare  No weakness, bladder/bowel incontinence, or saddle anesthesia.  Hx of imaging showing of her hip showed nothing acute.  She has a hx of fibromyalgia.  - prednisone with taper for 10 day course - referral to PT - if no improvement may need to consider increasing gabapentin, MRI low back, referral to Ortho

## 2015-08-08 ENCOUNTER — Ambulatory Visit: Payer: Medicare HMO | Attending: Family Medicine | Admitting: Physical Therapy

## 2015-08-08 DIAGNOSIS — R269 Unspecified abnormalities of gait and mobility: Secondary | ICD-10-CM | POA: Diagnosis present

## 2015-08-08 DIAGNOSIS — R293 Abnormal posture: Secondary | ICD-10-CM | POA: Diagnosis present

## 2015-08-08 DIAGNOSIS — R29898 Other symptoms and signs involving the musculoskeletal system: Secondary | ICD-10-CM | POA: Insufficient documentation

## 2015-08-08 DIAGNOSIS — M62838 Other muscle spasm: Secondary | ICD-10-CM | POA: Diagnosis present

## 2015-08-08 DIAGNOSIS — M25551 Pain in right hip: Secondary | ICD-10-CM | POA: Diagnosis not present

## 2015-08-08 NOTE — Therapy (Signed)
[place], [place], [address] Phone: [phone]   Fax:  [phone]  Physical Therapy Evaluation  Patient Details  Name: Megan Kemp MRN: [id number] Date of Birth: [birth year] Referring Provider:  Rosemarie Ax, MD  Encounter Date: 08/08/2015      PT End of Session - 08/08/15 1144    Visit Number 1   Number of Visits 12   Date for PT Re-Evaluation 09/19/15   Authorization Type Medicare, KX modifier by 15th visit, progress note by 8th or 9th visit.   PT Start Time 1100   PT Stop Time 1155   PT Time Calculation (min) 55 min   Activity Tolerance Patient limited by pain   Behavior During Therapy [facility] for tasks assessed/performed      Past Medical History  Diagnosis Date  . DUB (dysfunctional uterine bleeding)   . GERD (gastroesophageal reflux disease)   . Migraine with vertigo   . Memory loss   . Vertigo   . Anxiety   . Panic attacks   . Anemia   . Migraine   . CATARACT, RIGHT EYE 10/13/2009  . FIBROIDS, UTERUS 09/28/2009  . Sickle-cell trait 09/28/2009    Significant past medical history only.  Pt has not had any medical complications 2/2 this problem.    . Fibroid uterus 06/18/2012  . AUDITORY HALLUCINATION 01/09/2011    When depression severe in late 2011 and early 2012- had auditory hallucinations stating.  " suicide, suicide"  No hallucinations at this time.    . Chronic pain syndrome   . Carpal tunnel syndrome, bilateral 2012    denies wanting injection or surgery    Past Surgical History  Procedure Laterality Date  . Tubal ligation    . Endometrial ablation  2007  . Cataract extraction  age 61  . Head & neck wound repair / closure  2001    hit in head with pole  . Laparoscopy  11/11/2011    Procedure: LAPAROSCOPY OPERATIVE;  Surgeon: Osborne Oman, MD;  Location: [facility];  Service: Gynecology;  Laterality: N/A;  . Total abdominal hysterectomy w/ bilateral salpingoophorectomy   02/10/2013    Performed at [facility]    There were no vitals filed for this visit.  Visit Diagnosis:  Right hip pain - Plan: PT plan of care cert/re-cert  Weakness of right hip - Plan: PT plan of care cert/re-cert  Abnormality of gait - Plan: PT plan of care cert/re-cert  Abnormal posture - Plan: PT plan of care cert/re-cert  Muscle spasm of right lower extremity - Plan: PT plan of care cert/re-cert      Subjective Assessment - 08/08/15 1111    Subjective pt is a 54 y.o with CC of R sciatica that started about 6 months ago, pt reports starting insidiously. She reports pain that radaites to the toes of the R foot.    Limitations Lifting;Sitting;Standing;Walking;House hold activities   How long can you sit comfortably? 10 min   How long can you stand comfortably? 10 min   How long can you walk comfortably? 10 min   Diagnostic tests 07/16/2015 No acute or significant finding. Normal appearing hip joint.   Patient Stated Goals to be pain free,    Currently in Pain? Yes   Pain Score 10-Worst pain ever   Pain Location Leg   Pain Orientation Right   Pain Descriptors / Indicators Sharp;Aching;Tightness;Nagging;Spasm;Shooting   Pain Type Chronic pain   Pain Radiating  Towards Down RLE to the toes   Pain Onset More than a month ago   Pain Frequency Constant   Aggravating Factors  walk, standing, stairs, rolling over, and movement   Pain Relieving Factors arthritis ointment, icy hot,             [facility] PT Assessment - 08/08/15 1116    Assessment   Medical Diagnosis R Sciatic   Hand Dominance Right   Next MD Visit unknown   Precautions   Precautions None   Restrictions   Weight Bearing Restrictions No   Balance Screen   Has the patient fallen in the past 6 months No   Has the patient had a decrease in activity level because of a fear of falling?  No   Is the patient reluctant to leave their home because of a fear of falling?  No   Home Environment   Living  Environment Private residence   Living Arrangements Alone   Type of [place] to enter   Entrance Stairs-Number of Steps 3   Entrance Stairs-Rails Can reach both   [place] One level   [place] None   Prior Function   Level of Independence Independent;Independent with basic ADLs   Vocation On disability   Leisure sports, grandchildren, cooking   Cognition   Overall Cognitive Status Within Functional Limits for tasks assessed   Observation/Other Assessments   Focus on Therapeutic Outcomes (FOTO)  74% limited  predicted 45% limited   Posture/Postural Control   Posture/Postural Control Postural limitations   Postural Limitations Rounded Shoulders;Forward head   ROM / Strength   AROM / PROM / Strength AROM;PROM;Strength   AROM   AROM Assessment Site Hip   Right/Left Hip Right;Left   Right Hip Extension 15  pain at endrange   Right Hip Flexion 85  pain at end range   Right Hip ABduction 20  pain at endrange   PROM   PROM Assessment Site Hip   Right/Left Hip Right   Strength   Strength Assessment Site Hip   Palpation   Palpation comment tenderness located at the right posterior gluteal region and along the piriformis and distribution of the sciatic nerve   Ambulation/Gait   Gait Pattern Step-to pattern;Decreased arm swing - right;Decreased arm swing - left;Decreased stance time - right;Decreased step length - left;Antalgic;Trunk flexed  with crutches                   [facility] Adult PT Treatment/Exercise - 08/08/15 1116    Self-Care   Self-Care Posture   Posture during wlaking and proper use of crutches for support   Knee/Hip Exercises: Stretches   Piriformis Stretch 5 reps;30 seconds   Modalities   Modalities Electrical Stimulation;Moist Heat   Moist Heat Therapy   Number Minutes Moist Heat 15 Minutes   Moist Heat Location Hip  posterior aspect of R hip in L sidelying   Electrical Stimulation   Electrical Stimulation Location R  piriformis   Electrical Stimulation Action IFC   Electrical Stimulation Parameters 100% scan, 15 minutes, to tolerance   Electrical Stimulation Goals Pain                PT Education - 08/08/15 1147    Education provided Yes   Education Details evaluation findings, POC, goals, HEP, anatomical education   Person(s) Educated Patient   Methods Explanation   Comprehension Verbalized understanding  PT Short Term Goals - 08/08/15 1252    PT SHORT TERM GOAL #1   Title pt will be I with initial HEP (08/29/2015)   Time 3   Period Weeks   Status New   PT SHORT TERM GOAL #2   Title pt will be able to verbalize and demonstrate techniques to decrease R hip pain via postural awareness, lifting and carrying mechanics, and HEP (08/29/2015)   Time 3   Period Weeks   Status New           PT Long Term Goals - 08/08/15 1253    PT LONG TERM GOAL #1   Title at discharge pt will be I with all HEP given throughout HEP (09/19/2015)   Time 6   Period Weeks   Status New   PT LONG TERM GOAL #2   Title pt will be able to stand/ walk for >30 minutes with < 2/10 pain to assist with funcitonal edurance and community ambulation activities (09/19/2015)   Time 6   Period Weeks   Status New   PT LONG TERM GOAL #3   Title She will be demonstrate R hip AROM to WNL with <2/10 pain to help with funcitonal progression (09/19/2015)   Time 6   Period Weeks   Status New   PT LONG TERM GOAL #4   Title pt will demonstrate increased RLE strength to > 4+/5 with no  pain to assist with prolonged walkng and standing activities associated with ADLs (09/19/2015)   Time 6   Period Weeks   Status New   PT LONG TERM GOAL #5   Title She will increase her FOTO score to > 50 to demonstrate improved funcitonal capacity at discharge (09/19/2015)   Time 6   Period Weeks   Status New               Plan - 08/08/15 1152    Clinical Impression Statement Megan Kemp presents to OPPT with CC of R sciatic pain  that has been present for the last 6 months. Todays evaluation was limited secondary to patients pain reported to 10/10, requiring lying down in order to help decrease additional pain response. She demonstrates AROM WFL with pain noted during movement in all planes. Palpation reveals tendnerness along the posterior glute with increased pain in the piriformis with referral of symptoms along the sciatic nerve distrubution. MMT was deferred today due ot pain. Educated about stretching and anatomical causes of sciatic with piriformis involvement and pt reported understanding. she currently ambulates with crutches requring VC on proper gait and adjust crutches to proper height, she exhibits antalgic step to gati pattern with limited stance on R and decreased step length on L. she would benefit from skilled physical therpay to decrease pain and improve function by addressing the impairments listed.    Pt will benefit from skilled therapeutic intervention in order to improve on the following deficits Abnormal gait;Decreased activity tolerance;Decreased balance;Decreased endurance;Decreased range of motion;Difficulty walking;Impaired tone;Pain;Improper body mechanics;Postural dysfunction;Decreased strength;Decreased knowledge of use of DME;Impaired perceived functional ability;Hypomobility   Rehab Potential Good   PT Frequency 2x / week   PT Duration 6 weeks   PT Treatment/Interventions ADLs/Self Care Home Management;Cryotherapy;Electrical Stimulation;Moist Heat;Ultrasound;Functional mobility training;Therapeutic activities;Therapeutic exercise;Balance training;Neuromuscular re-education;Patient/family education;Manual techniques;Passive range of motion;Dry needling;Taping   PT Next Visit Plan assess response ot HEP, stretching of R posterior hip musculature, modalities PRN, education regarding dry needling   PT Home Exercise Plan piriformis, hamstring, and glute stretches   Consulted  and Agree with Plan of Care  Patient          G-Codes - 08-15-15 1303    Functional Assessment Tool Used FOTO 74% limitation   Functional Limitation Mobility: Walking and moving around   Mobility: Walking and Moving Around Current Status [phone]) At least 60 percent but less than 80 percent impaired, limited or restricted   Mobility: Walking and Moving Around Goal Status [phone]) At least 40 percent but less than 60 percent impaired, limited or restricted       Problem List Patient Active Problem List   Diagnosis Date Noted  . Sciatica 07/28/2015  . GERD (gastroesophageal reflux disease) 03/07/2015  . Hx of colonic polyps 03/21/2014  . Hematometra 12/04/2012  . Chronic generalized pain 09/11/2012  . Carpal tunnel syndrome, bilateral 06/10/2012  . Generalized anxiety disorder 05/16/2011  . Dysphagia 04/18/2011  . Migraine 04/08/2011  . SYSTOLIC MURMUR 57/50/5183  . Depression 10/17/2010  . Sickle-cell trait 09/28/2009   Starr Lake PT, DPT, LAT, ATC  2015-08-15  1:09 PM     [facility] [address] [place], [place], [address] Phone: [phone]   Fax:  [phone]

## 2015-08-08 NOTE — Patient Instructions (Signed)
   Nikoleta Dady PT, DPT, LAT, ATC  [facility] Phone: [phone]     

## 2015-08-09 ENCOUNTER — Ambulatory Visit: Payer: Medicare HMO | Admitting: Physical Therapy

## 2015-08-09 DIAGNOSIS — M25551 Pain in right hip: Secondary | ICD-10-CM | POA: Diagnosis not present

## 2015-08-09 DIAGNOSIS — R269 Unspecified abnormalities of gait and mobility: Secondary | ICD-10-CM

## 2015-08-09 DIAGNOSIS — M62838 Other muscle spasm: Secondary | ICD-10-CM

## 2015-08-09 NOTE — Patient Instructions (Addendum)

## 2015-08-09 NOTE — Therapy (Signed)
[facility] [address] [place], [place], [address] Phone: [phone]   Fax:  [phone]  Physical Therapy Treatment  Patient Details  Name: Megan Kemp MRN: [id number] Date of Birth: 1961-06-16 Referring Provider:  Myra Rude, MD  Encounter Date: 08/09/2015      PT End of Session - 08/09/15 1751    Visit Number 2   Number of Visits 12   Date for PT Re-Evaluation 09/19/15   PT Start Time 1635   PT Stop Time 1740   PT Time Calculation (min) 65 min   Activity Tolerance Patient limited by pain   Behavior During Therapy [facility] for tasks assessed/performed      Past Medical History  Diagnosis Date  . DUB (dysfunctional uterine bleeding)   . GERD (gastroesophageal reflux disease)   . Migraine with vertigo   . Memory loss   . Vertigo   . Anxiety   . Panic attacks   . Anemia   . Migraine   . CATARACT, RIGHT EYE 10/13/2009  . FIBROIDS, UTERUS 09/28/2009  . Sickle-cell trait 09/28/2009    Significant past medical history only.  Pt has not had any medical complications 2/2 this problem.    . Fibroid uterus 06/18/2012  . AUDITORY HALLUCINATION 01/09/2011    When depression severe in late 2011 and early 2012- had auditory hallucinations stating.  " suicide, suicide"  No hallucinations at this time.    . Chronic pain syndrome   . Carpal tunnel syndrome, bilateral 2012    denies wanting injection or surgery    Past Surgical History  Procedure Laterality Date  . Tubal ligation    . Endometrial ablation  2007  . Cataract extraction  age 66  . Head & neck wound repair / closure  2001    hit in head with pole  . Laparoscopy  11/11/2011    Procedure: LAPAROSCOPY OPERATIVE;  Surgeon: Tereso Newcomer, MD;  Location: [facility];  Service: Gynecology;  Laterality: N/A;  . Total abdominal hysterectomy w/ bilateral salpingoophorectomy  02/10/2013    Performed at [facility]    There were no vitals filed for this  visit.  Visit Diagnosis:  Right hip pain  Muscle spasm of right lower extremity  Abnormality of gait      Subjective Assessment - 08/09/15 1639    Subjective It does not hurt as much 9/10   The exercises are helpful.     Currently in Pain? Yes   Pain Score 9    Pain Location Buttocks   Pain Orientation Right   Pain Descriptors / Indicators Aching;Stabbing;Shooting;Tightness;Nagging;Spasm   Pain Radiating Towards To toes   Pain Frequency Constant   Aggravating Factors  walking, standing stairs sidelying  Rt   Pain Relieving Factors stretching   Multiple Pain Sites --  Neck 8/10.  Hands 8/10            [facility] PT Assessment - 08/09/15 0001    AROM   Lumbar Flexion 74  knees bent during flexion, pain during movement   Lumbar Extension 21  pain at end range   Lumbar - Right Side Bend 40  pain during movement   Lumbar - Left Side Bend 38  pain during movement                     [facility] Adult PT Treatment/Exercise - 08/09/15 1647    Lumbar Exercises: [employer]   Press Ups 5 reps  Press Ups Limitations did not change pain   Piriformis Stretch 3 reps;30 seconds   Knee/Hip Exercises: Stretches   Other Knee/Hip Stretches ITBand stretch 1 rep 20 seconds.   Other Knee/Hip Stretches Quadratus lumborum stretch sidelying with arm overhead.   Moist Heat Therapy   Number Minutes Moist Heat --  initially during self care and post session in tandem/ IFC   Moist Heat Location Hip   Electrical Stimulation   Electrical Stimulation Location Piriformis RT, lateral thigh   Electrical Stimulation Action IFC   Electrical Stimulation Parameters 18   Electrical Stimulation Goals Pain   Manual Therapy   Manual Therapy Taping   Kinesiotex Create Space   Kinesiotix   Create Space Lateral hip * pattern. 50% middle third.                PT Education - 08/09/15 1750    Education provided Yes   Person(s) Educated Patient   Methods Explanation;Demonstration;Verbal  cues;Handout   Comprehension Verbalized understanding;Returned demonstration          PT Short Term Goals - 08/08/15 1252    PT SHORT TERM GOAL #1   Title pt will be I with initial HEP (08/29/2015)   Time 3   Period Weeks   Status New   PT SHORT TERM GOAL #2   Title pt will be able to verbalize and demonstrate techniques to decrease R hip pain via postural awareness, lifting and carrying mechanics, and HEP (08/29/2015)   Time 3   Period Weeks   Status New           PT Long Term Goals - 08/08/15 1253    PT LONG TERM GOAL #1   Title at discharge pt will be I with all HEP given throughout HEP (09/19/2015)   Time 6   Period Weeks   Status New   PT LONG TERM GOAL #2   Title pt will be able to stand/ walk for >30 minutes with < 2/10 pain to assist with funcitonal edurance and community ambulation activities (09/19/2015)   Time 6   Period Weeks   Status New   PT LONG TERM GOAL #3   Title She will be demonstrate R hip AROM to WNL with <2/10 pain to help with funcitonal progression (09/19/2015)   Time 6   Period Weeks   Status New   PT LONG TERM GOAL #4   Title pt will demonstrate increased RLE strength to > 4+/5 with no  pain to assist with prolonged walkng and standing activities associated with ADLs (09/19/2015)   Time 6   Period Weeks   Status New   PT LONG TERM GOAL #5   Title She will increase her FOTO score to > 50 to demonstrate improved funcitonal capacity at discharge (09/19/2015)   Time 6   Period Weeks   Status New               Plan - 08/09/15 1751    Clinical Impression Statement assess tape. focus on self care, stretching amd pain relief. pain 8/10 post session.  She is worried  she may have hip cancer.  I referred her back to the MD for that question.  Progress toward home exercise goal.   PT Next Visit Plan assess taping, continue modalities. stretch    PT Home Exercise Plan quadratus lumborum, other stretches   Consulted and Agree with Plan of Care  Patient          G-Codes -  08/08/15 1303    Functional Assessment Tool Used FOTO 74% limitation   Functional Limitation Mobility: Walking and moving around   Mobility: Walking and Moving Around Current Status [phone]) At least 60 percent but less than 80 percent impaired, limited or restricted   Mobility: Walking and Moving Around Goal Status [phone]) At least 40 percent but less than 60 percent impaired, limited or restricted      Problem List Patient Active Problem List   Diagnosis Date Noted  . Sciatica 07/28/2015  . GERD (gastroesophageal reflux disease) 03/07/2015  . Hx of colonic polyps 03/21/2014  . Hematometra 12/04/2012  . Chronic generalized pain 09/11/2012  . Carpal tunnel syndrome, bilateral 06/10/2012  . Generalized anxiety disorder 05/16/2011  . Dysphagia 04/18/2011  . Migraine 04/08/2011  . SYSTOLIC MURMUR 01/16/2011  . Depression 10/17/2010  . Sickle-cell trait 09/28/2009    Gentri Guardado 08/09/2015, 6:00 PM  [facility] [address] [place], [place], [address] Phone: [phone]   Fax:  [phone]     Liz Beach, PTA 08/09/2015 6:00 PM Phone: [phone] Fax: [phone]

## 2015-08-15 ENCOUNTER — Ambulatory Visit: Payer: Medicare HMO | Admitting: Physical Therapy

## 2015-08-15 DIAGNOSIS — M25551 Pain in right hip: Secondary | ICD-10-CM | POA: Diagnosis not present

## 2015-08-15 DIAGNOSIS — M62838 Other muscle spasm: Secondary | ICD-10-CM

## 2015-08-15 DIAGNOSIS — R29898 Other symptoms and signs involving the musculoskeletal system: Secondary | ICD-10-CM

## 2015-08-15 DIAGNOSIS — R293 Abnormal posture: Secondary | ICD-10-CM

## 2015-08-15 NOTE — Therapy (Signed)
[facility], [place], [address] Phone: [phone]   Fax:  [phone]  Physical Therapy Treatment  Patient Details  Name: Megan Kemp MRN: [id number] Date of Birth: 27-Jul-1961 Referring Provider:  Rosemarie Ax, MD  Encounter Date: 08/15/2015      PT End of Session - 08/15/15 1817    Visit Number 3   Number of Visits 12   Date for PT Re-Evaluation 09/19/15    time in 1105 Time out 1205   Past Medical History  Diagnosis Date  . DUB (dysfunctional uterine bleeding)   . GERD (gastroesophageal reflux disease)   . Migraine with vertigo   . Memory loss   . Vertigo   . Anxiety   . Panic attacks   . Anemia   . Migraine   . CATARACT, RIGHT EYE 10/13/2009  . FIBROIDS, UTERUS 09/28/2009  . Sickle-cell trait 09/28/2009    Significant past medical history only.  Pt has not had any medical complications 2/2 this problem.    . Fibroid uterus 06/18/2012  . AUDITORY HALLUCINATION 01/09/2011    When depression severe in late 2011 and early 2012- had auditory hallucinations stating.  " suicide, suicide"  No hallucinations at this time.    . Chronic pain syndrome   . Carpal tunnel syndrome, bilateral 2012    denies wanting injection or surgery    Past Surgical History  Procedure Laterality Date  . Tubal ligation    . Endometrial ablation  2007  . Cataract extraction  age 35  . Head & neck wound repair / closure  2001    hit in head with pole  . Laparoscopy  11/11/2011    Procedure: LAPAROSCOPY OPERATIVE;  Surgeon: Osborne Oman, MD;  Location: [facility];  Service: Gynecology;  Laterality: N/A;  . Total abdominal hysterectomy w/ bilateral salpingoophorectomy  02/10/2013    Performed at [place]    There were no vitals filed for this visit.  Visit Diagnosis:  Right hip pain  Muscle spasm of right lower extremity  Weakness of right hip  Abnormal posture      Subjective Assessment -  08/15/15 1110    Subjective tape helped hip.  Wants tape for swelling lower leg RT and Hip tape replaced.  Pain 9/10  Doing her home exercises.                           [facility] Adult PT Treatment/Exercise - 08/15/15 1133    Lumbar Exercises: Stretches   Single Knee to Chest Stretch --  10 reps, feels good   Lower Trunk Rotation --  5 reps 10 seconds,  ROm increased with this   Pelvic Tilt --  tilt to find neutral   Quadruped Mid Back Stretch 1 rep  cat/camel. Low back stiff,  Child's pose and quadratus stret   Knee/Hip Exercises: Stretches   Passive Hamstring Stretch 3 reps;30 seconds  RT   Piriformis Stretch 3 reps;10 seconds   Knee/Hip Exercises: Supine   Other Supine Knee/Hip Exercises abdominal brace with march 10 reps  with cues initially.   Moist Heat Therapy   Number Minutes Moist Heat 15 Minutes   Moist Heat Location Hip   Manual Therapy   Manual Therapy Edema management;Taping   Kinesiotex Edema;Create Space  lateral hip *, fan lower leg/edema. I IT band/ create space  PT Short Term Goals - 08/15/15 1815    PT SHORT TERM GOAL #1   Title pt will be I with initial HEP (08/29/2015)   Time 3   Period Weeks   Status On-going   PT SHORT TERM GOAL #2   Title pt will be able to verbalize and demonstrate techniques to decrease R hip pain via postural awareness, lifting and carrying mechanics, and HEP (08/29/2015)   Time 3   Period Weeks   Status On-going           PT Long Term Goals - 08/08/15 1253    PT LONG TERM GOAL #1   Title at discharge pt will be I with all HEP given throughout HEP (09/19/2015)   Time 6   Period Weeks   Status New   PT LONG TERM GOAL #2   Title pt will be able to stand/ walk for >30 minutes with < 2/10 pain to assist with funcitonal edurance and community ambulation activities (09/19/2015)   Time 6   Period Weeks   Status New   PT LONG TERM GOAL #3   Title She will be demonstrate R hip AROM to WNL  with <2/10 pain to help with funcitonal progression (09/19/2015)   Time 6   Period Weeks   Status New   PT LONG TERM GOAL #4   Title pt will demonstrate increased RLE strength to > 4+/5 with no  pain to assist with prolonged walkng and standing activities associated with ADLs (09/19/2015)   Time 6   Period Weeks   Status New   PT LONG TERM GOAL #5   Title She will increase her FOTO score to > 50 to demonstrate improved funcitonal capacity at discharge (09/19/2015)   Time 6   Period Weeks   Status New               Plan - 08/15/15 1812    Clinical Impression Statement tape helpful so she wanted more tape, .  Stretching is focus today with exercise.  Pain is ratred a 9/10 today.  No new goals met        Problem List Patient Active Problem List   Diagnosis Date Noted  . Sciatica 07/28/2015  . GERD (gastroesophageal reflux disease) 03/07/2015  . Hx of colonic polyps 03/21/2014  . Hematometra 12/04/2012  . Chronic generalized pain 09/11/2012  . Carpal tunnel syndrome, bilateral 06/10/2012  . Generalized anxiety disorder 05/16/2011  . Dysphagia 04/18/2011  . Migraine 04/08/2011  . SYSTOLIC MURMUR 09/10/8021  . Depression 10/17/2010  . Sickle-cell trait 09/28/2009    HARRIS,KAREN 08/15/2015, 6:18 PM  [facility] [address] [place], [place], [address] Phone: [phone]   Fax:  [phone]     Melvenia Needles, PTA 08/15/2015 6:18 PM Phone: [phone] Fax: [phone]

## 2015-08-18 ENCOUNTER — Ambulatory Visit: Payer: Medicare HMO | Admitting: Physical Therapy

## 2015-08-18 DIAGNOSIS — M62838 Other muscle spasm: Secondary | ICD-10-CM

## 2015-08-18 DIAGNOSIS — M25551 Pain in right hip: Secondary | ICD-10-CM | POA: Diagnosis not present

## 2015-08-18 DIAGNOSIS — R269 Unspecified abnormalities of gait and mobility: Secondary | ICD-10-CM

## 2015-08-18 DIAGNOSIS — R293 Abnormal posture: Secondary | ICD-10-CM

## 2015-08-18 DIAGNOSIS — R29898 Other symptoms and signs involving the musculoskeletal system: Secondary | ICD-10-CM

## 2015-08-18 NOTE — Therapy (Signed)
[place], [place], [address] Phone: [phone]   Fax:  [phone]  Physical Therapy Treatment  Patient Details  Name: Megan Kemp MRN: [id number] Date of Birth: 1961-02-08 Referring Provider:  Rosemarie Ax, MD  Encounter Date: 08/18/2015      PT End of Session - 08/18/15 1217    Visit Number 4   Number of Visits 12   Date for PT Re-Evaluation 09/19/15   Authorization Type Medicare, KX modifier by 15th visit, progress note by 8th or 9th visit.   PT Start Time 1015   PT Stop Time 1120   PT Time Calculation (min) 65 min   Activity Tolerance Patient tolerated treatment well;Patient limited by pain   Behavior During Therapy [facility] for tasks assessed/performed      Past Medical History  Diagnosis Date  . DUB (dysfunctional uterine bleeding)   . GERD (gastroesophageal reflux disease)   . Migraine with vertigo   . Memory loss   . Vertigo   . Anxiety   . Panic attacks   . Anemia   . Migraine   . CATARACT, RIGHT EYE 10/13/2009  . FIBROIDS, UTERUS 09/28/2009  . Sickle-cell trait 09/28/2009    Significant past medical history only.  Pt has not had any medical complications 2/2 this problem.    . Fibroid uterus 06/18/2012  . AUDITORY HALLUCINATION 01/09/2011    When depression severe in late 2011 and early 2012- had auditory hallucinations stating.  " suicide, suicide"  No hallucinations at this time.    . Chronic pain syndrome   . Carpal tunnel syndrome, bilateral 2012    denies wanting injection or surgery    Past Surgical History  Procedure Laterality Date  . Tubal ligation    . Endometrial ablation  2007  . Cataract extraction  age 54  . Head & neck wound repair / closure  2001    hit in head with pole  . Laparoscopy  11/11/2011    Procedure: LAPAROSCOPY OPERATIVE;  Surgeon: Osborne Oman, MD;  Location: [facility];  Service: Gynecology;  Laterality: N/A;  . Total abdominal hysterectomy w/  bilateral salpingoophorectomy  02/10/2013    Performed at [facility]    There were no vitals filed for this visit.  Visit Diagnosis:  Right hip pain  Muscle spasm of right lower extremity  Weakness of right hip  Abnormal posture  Abnormality of gait      Subjective Assessment - 08/18/15 1019    Subjective "the tape is really helping make a difference" pt reports that it is slowly getting better   Currently in Pain? Yes   Pain Score 8    Pain Orientation Right   Pain Descriptors / Indicators Aching;Shooting;Tightness;Tingling   Pain Type Chronic pain   Pain Frequency Constant   Aggravating Factors  walking, standing, stairs, sidelying   Pain Relieving Factors stretching, KT tape                         [facility] Adult PT Treatment/Exercise - 08/18/15 0001    Lumbar Exercises: Stretches   Active Hamstring Stretch 2 reps;30 seconds   Piriformis Stretch 2 reps;30 seconds   Lumbar Exercises: Quadruped   [place] 10 reps   Other Quadruped Lumbar Exercises childs pose 2 x 20 sec   Knee/Hip Exercises: Stretches   Other Knee/Hip Stretches Quadratus lumborum stretch sidelying with arm overhead.   Knee/Hip Exercises: Supine  Other Supine Knee/Hip Exercises abdominal brace with march 10 reps  with cues initially.   Moist Heat Therapy   Number Minutes Moist Heat 10 Minutes   Moist Heat Location Hip  in Left sidelying   Electrical Stimulation   Electrical Stimulation Location Piriformis RT, lateral ankle   Electrical Stimulation Action Pre Mod   Electrical Stimulation Parameters 10 minutes, Level 14 for hip, level 8 for ankle   Electrical Stimulation Goals Pain   Manual Therapy   Manual Therapy Myofascial release;Soft tissue mobilization   Myofascial Release DTM rolling with tennis ball, and trigger point release in the R pirifomris/ glute med/ max   Kinesiotex Edema;Create Space  lateral hip, latera fibula, and IT band                   PT Short Term Goals - 08/15/15 1815    PT SHORT TERM GOAL #1   Title pt will be I with initial HEP (08/29/2015)   Time 3   Period Weeks   Status On-going   PT SHORT TERM GOAL #2   Title pt will be able to verbalize and demonstrate techniques to decrease R hip pain via postural awareness, lifting and carrying mechanics, and HEP (08/29/2015)   Time 3   Period Weeks   Status On-going           PT Long Term Goals - 08/18/15 1225    PT LONG TERM GOAL #1   Title at discharge pt will be I with all HEP given throughout HEP (09/19/2015)   Time 6   Period Weeks   Status On-going   PT LONG TERM GOAL #2   Title pt will be able to stand/ walk for >30 minutes with < 2/10 pain to assist with funcitonal edurance and community ambulation activities (09/19/2015)   Time 6   Period Weeks   Status On-going   PT LONG TERM GOAL #3   Title She will be demonstrate R hip AROM to WNL with <2/10 pain to help with funcitonal progression (09/19/2015)   Time 6   Period Weeks   Status On-going   PT LONG TERM GOAL #4   Title pt will demonstrate increased RLE strength to > 4+/5 with no  pain to assist with prolonged walkng and standing activities associated with ADLs (09/19/2015)   Time 6   Period Weeks   Status On-going   PT LONG TERM GOAL #5   Title She will increase her FOTO score to > 50 to demonstrate improved funcitonal capacity at discharge (09/19/2015)   Time 6   Period Weeks   Status On-going               Plan - 08/18/15 1218    Clinical Impression Statement Megan Kemp reports that she is doing a little better since the last visit. she reports that tape helps more. she was able to complete all exercises with and stated she liked the NuStep. She did report some minor discomfort and referral of pain during rolling with tennis ball and 2 x trigger point rlease but she reported some pain relief. Educated about KT tape and where she can find it.  No new goals met this visit.    PT Next Visit Plan  Continue modalities. stretch, Nustep, Manual if tolerated, KT taping    Consulted and Agree with Plan of Care Patient        Problem List Patient Active Problem List   Diagnosis Date Noted  . Sciatica  07/28/2015  . GERD (gastroesophageal reflux disease) 03/07/2015  . Hx of colonic polyps 03/21/2014  . Hematometra 12/04/2012  . Chronic generalized pain 09/11/2012  . Carpal tunnel syndrome, bilateral 06/10/2012  . Generalized anxiety disorder 05/16/2011  . Dysphagia 04/18/2011  . Migraine 04/08/2011  . SYSTOLIC MURMUR 95/74/7340  . Depression 10/17/2010  . Sickle-cell trait 09/28/2009   Starr Lake PT, DPT, LAT, ATC  08/18/2015  12:25 PM      [facility] [address] [place], [place], [address] Phone: [phone]   Fax:  [phone]

## 2015-08-22 ENCOUNTER — Ambulatory Visit: Payer: Medicare HMO | Admitting: Physical Therapy

## 2015-08-22 DIAGNOSIS — R29898 Other symptoms and signs involving the musculoskeletal system: Secondary | ICD-10-CM

## 2015-08-22 DIAGNOSIS — R269 Unspecified abnormalities of gait and mobility: Secondary | ICD-10-CM

## 2015-08-22 DIAGNOSIS — M25551 Pain in right hip: Secondary | ICD-10-CM

## 2015-08-22 DIAGNOSIS — M62838 Other muscle spasm: Secondary | ICD-10-CM

## 2015-08-22 DIAGNOSIS — R293 Abnormal posture: Secondary | ICD-10-CM

## 2015-08-22 NOTE — Therapy (Signed)
[place], [place], [address] Phone: [phone]   Fax:  [phone]  Physical Therapy Treatment  Patient Details  Name: Megan Kemp MRN: [id number] Date of Birth: 03-Nov-1961 Referring Provider:  Rosemarie Ax, MD  Encounter Date: 08/22/2015      PT End of Session - 08/22/15 1249    Visit Number 5   Number of Visits 12   Date for PT Re-Evaluation 09/19/15   Authorization Type Medicare, KX modifier by 15th visit, progress note by 8th or 9th visit.   PT Start Time 1145   PT Stop Time 1245   PT Time Calculation (min) 60 min   Activity Tolerance Patient tolerated treatment well   Behavior During Therapy [facility] for tasks assessed/performed      Past Medical History  Diagnosis Date  . DUB (dysfunctional uterine bleeding)   . GERD (gastroesophageal reflux disease)   . Migraine with vertigo   . Memory loss   . Vertigo   . Anxiety   . Panic attacks   . Anemia   . Migraine   . CATARACT, RIGHT EYE 10/13/2009  . FIBROIDS, UTERUS 09/28/2009  . Sickle-cell trait 09/28/2009    Significant past medical history only.  Pt has not had any medical complications 2/2 this problem.    . Fibroid uterus 06/18/2012  . AUDITORY HALLUCINATION 01/09/2011    When depression severe in late 2011 and early 2012- had auditory hallucinations stating.  " suicide, suicide"  No hallucinations at this time.    . Chronic pain syndrome   . Carpal tunnel syndrome, bilateral 2012    denies wanting injection or surgery    Past Surgical History  Procedure Laterality Date  . Tubal ligation    . Endometrial ablation  2007  . Cataract extraction  age 71  . Head & neck wound repair / closure  2001    hit in head with pole  . Laparoscopy  11/11/2011    Procedure: LAPAROSCOPY OPERATIVE;  Surgeon: Osborne Oman, MD;  Location: [facility];  Service: Gynecology;  Laterality: N/A;  . Total abdominal hysterectomy w/ bilateral  salpingoophorectomy  02/10/2013    Performed at [facility]    There were no vitals filed for this visit.  Visit Diagnosis:  Right hip pain  Muscle spasm of right lower extremity  Weakness of right hip  Abnormal posture  Abnormality of gait      Subjective Assessment - 08/22/15 1157    Subjective " I was doing pretty go this last weekend until I took the tape off and all the pain started coming back"   Currently in Pain? Yes   Pain Score 9   was a 7/10 this morning   Pain Location Buttocks   Pain Orientation Right   Pain Descriptors / Indicators Aching;Shooting;Tightness;Tingling   Pain Type Chronic pain   Pain Onset More than a month ago   Pain Frequency Constant   Aggravating Factors  walking, standing, stairs, sidelying                         [facility] Adult PT Treatment/Exercise - 08/22/15 1159    Lumbar Exercises: Stretches   Active Hamstring Stretch 2 reps;30 seconds   Single Knee to Chest Stretch 2 reps;30 seconds   Lower Trunk Rotation 2 reps;30 seconds   Pelvic Tilt 2 reps;30 seconds   ITB Stretch 2 reps;30 seconds  in L sidelying  Piriformis Stretch 2 reps;30 seconds   Knee/Hip Exercises: Stretches   Other Knee/Hip Stretches Quadratus lumborum stretch sidelying with arm overhead.   Knee/Hip Exercises: Aerobic   Nustep L 7 x 10 min   Knee/Hip Exercises: Supine   Other Supine Knee/Hip Exercises abdominal brace with march 10 reps  with cues initially.   Modalities   Modalities Ultrasound   Moist Heat Therapy   Number Minutes Moist Heat --   Moist Heat Location --   Electrical Stimulation   Electrical Stimulation Location Piriformis RT, lateral ankle   Electrical Stimulation Action IFC   Electrical Stimulation Parameters 15   Electrical Stimulation Goals Pain   Ultrasound   Ultrasound Location R glute medius/ minimus   Ultrasound Parameters 100% x 8 min @ 1.2   Ultrasound Goals Pain   Manual Therapy   Manual Therapy Joint  mobilization   Myofascial Release DTM rolling with tennis ball, and trigger point release in the R pirifomris/ glute med/ max   Kinesiotex Edema;Create Space  over R hip/ IT band, and distal fibula                PT Education - 08/22/15 1251    Education provided Yes   Education Details [place] education   Person(s) Educated Patient   Methods Explanation   Comprehension Verbalized understanding          PT Short Term Goals - 08/22/15 1252    PT SHORT TERM GOAL #1   Title pt will be I with initial HEP (08/29/2015)   Time 3   Period Weeks   Status On-going   PT SHORT TERM GOAL #2   Title pt will be able to verbalize and demonstrate techniques to decrease R hip pain via postural awareness, lifting and carrying mechanics, and HEP (08/29/2015)   Time 3   Period Weeks   Status On-going           PT Long Term Goals - 08/22/15 1252    PT LONG TERM GOAL #1   Title at discharge pt will be I with all HEP given throughout HEP (09/19/2015)   Time 6   Period Weeks   Status On-going   PT LONG TERM GOAL #2   Title pt will be able to stand/ walk for >30 minutes with < 2/10 pain to assist with funcitonal edurance and community ambulation activities (09/19/2015)   Time 6   Period Weeks   Status On-going   PT LONG TERM GOAL #3   Title She will be demonstrate R hip AROM to WNL with <2/10 pain to help with funcitonal progression (09/19/2015)   Time 6   Period Weeks   Status On-going   PT LONG TERM GOAL #4   Title pt will demonstrate increased RLE strength to > 4+/5 with no  pain to assist with prolonged walkng and standing activities associated with ADLs (09/19/2015)   Time 6   Period Weeks   Status On-going   PT LONG TERM GOAL #5   Time 6   Period Weeks   Status On-going               Plan - 08/22/15 1249    Clinical Impression Statement Lazariah states she was doing well over the weekend until she took the tape off of her yesterday. she was able to complete all exercises  well today with report of mild tightness in the hip. upon DTM and trigger point release of the glute medius/minimus she reported reproduction  of the pain down the leg which decreased in severity with prolonged pressure. applied the tape again over the leg and performed US over the glute medius.  opted not to use heat to assess benefit of [place].    PT Next Visit Plan Continue modalities. stretch, Nustep, Manual if tolerated, KT taping, continue [place] if reported benefit.     PT Home Exercise Plan educated on [place]   Consulted and Agree with Plan of Care Patient        Problem List Patient Active Problem List   Diagnosis Date Noted  . Sciatica 07/28/2015  . GERD (gastroesophageal reflux disease) 03/07/2015  . Hx of colonic polyps 03/21/2014  . Hematometra 12/04/2012  . Chronic generalized pain 09/11/2012  . Carpal tunnel syndrome, bilateral 06/10/2012  . Generalized anxiety disorder 05/16/2011  . Dysphagia 04/18/2011  . Migraine 04/08/2011  . SYSTOLIC MURMUR 06/00/4599  . Depression 10/17/2010  . Sickle-cell trait 09/28/2009   Starr Lake PT, DPT, LAT, ATC  08/22/2015  12:55 PM      [facility] [address] [place], [place], [address] Phone: [phone]   Fax:  [phone]

## 2015-08-24 ENCOUNTER — Ambulatory Visit: Payer: Medicare HMO | Attending: Family Medicine | Admitting: Physical Therapy

## 2015-08-24 DIAGNOSIS — R269 Unspecified abnormalities of gait and mobility: Secondary | ICD-10-CM | POA: Diagnosis present

## 2015-08-24 DIAGNOSIS — M25551 Pain in right hip: Secondary | ICD-10-CM | POA: Diagnosis not present

## 2015-08-24 DIAGNOSIS — R29898 Other symptoms and signs involving the musculoskeletal system: Secondary | ICD-10-CM | POA: Diagnosis present

## 2015-08-24 DIAGNOSIS — R293 Abnormal posture: Secondary | ICD-10-CM | POA: Diagnosis present

## 2015-08-24 DIAGNOSIS — M62838 Other muscle spasm: Secondary | ICD-10-CM | POA: Insufficient documentation

## 2015-08-24 NOTE — Therapy (Signed)
[place], [place], [address] Phone: [phone]   Fax:  [phone]  Physical Therapy Treatment  Patient Details  Name: Megan Kemp MRN: [id number] Date of Birth: [birth year] Referring Provider:  Rosemarie Ax, MD  Encounter Date: 08/24/2015      PT End of Session - 08/24/15 1531    Visit Number 6   Number of Visits 12   Date for PT Re-Evaluation 09/19/15   PT Start Time 1150   PT Stop Time 6789   PT Time Calculation (min) 45 min   Activity Tolerance Patient tolerated treatment well;No increased pain   Behavior During Therapy [facility] for tasks assessed/performed      Past Medical History  Diagnosis Date  . DUB (dysfunctional uterine bleeding)   . GERD (gastroesophageal reflux disease)   . Migraine with vertigo   . Memory loss   . Vertigo   . Anxiety   . Panic attacks   . Anemia   . Migraine   . CATARACT, RIGHT EYE 10/13/2009  . FIBROIDS, UTERUS 09/28/2009  . Sickle-cell trait 09/28/2009    Significant past medical history only.  Pt has not had any medical complications 2/2 this problem.    . Fibroid uterus 06/18/2012  . AUDITORY HALLUCINATION 01/09/2011    When depression severe in late 2011 and early 2012- had auditory hallucinations stating.  " suicide, suicide"  No hallucinations at this time.    . Chronic pain syndrome   . Carpal tunnel syndrome, bilateral 2012    denies wanting injection or surgery    Past Surgical History  Procedure Laterality Date  . Tubal ligation    . Endometrial ablation  2007  . Cataract extraction  age 89  . Head & neck wound repair / closure  2001    hit in head with pole  . Laparoscopy  11/11/2011    Procedure: LAPAROSCOPY OPERATIVE;  Surgeon: Osborne Oman, MD;  Location: [facility];  Service: Gynecology;  Laterality: N/A;  . Total abdominal hysterectomy w/ bilateral salpingoophorectomy  02/10/2013    Performed at [facility]    There were no  vitals filed for this visit.  Visit Diagnosis:  Right hip pain  Muscle spasm of right lower extremity  Abnormal posture  Abnormality of gait      Subjective Assessment - 08/24/15 1526    Currently in Pain? Yes   Pain Score 8    Pain Location Leg   Pain Orientation Left   Pain Descriptors / Indicators Aching;Shooting;Tightness;Tingling   Pain Type Chronic pain                         [facility] Adult PT Treatment/Exercise - 08/24/15 1150    Knee/Hip Exercises: Stretches   Passive Hamstring Stretch 3 reps;30 seconds   Piriformis Stretch 3 reps;30 seconds   Gastroc Stretch 3 reps;30 seconds  stretched along with hamstrings.   Other Knee/Hip Stretches IT band, sidelying.                  PT Short Term Goals - 08/22/15 1252    PT SHORT TERM GOAL #1   Title pt will be I with initial HEP (08/29/2015)   Time 3   Period Weeks   Status On-going   PT SHORT TERM GOAL #2   Title pt will be able to verbalize and demonstrate techniques to decrease R hip pain via postural awareness, lifting and  carrying mechanics, and HEP (08/29/2015)   Time 3   Period Weeks   Status On-going           PT Long Term Goals - 08/22/15 1252    PT LONG TERM GOAL #1   Title at discharge pt will be I with all HEP given throughout HEP (09/19/2015)   Time 6   Period Weeks   Status On-going   PT LONG TERM GOAL #2   Title pt will be able to stand/ walk for >30 minutes with < 2/10 pain to assist with funcitonal edurance and community ambulation activities (09/19/2015)   Time 6   Period Weeks   Status On-going   PT LONG TERM GOAL #3   Title She will be demonstrate R hip AROM to WNL with <2/10 pain to help with funcitonal progression (09/19/2015)   Time 6   Period Weeks   Status On-going   PT LONG TERM GOAL #4   Title pt will demonstrate increased RLE strength to > 4+/5 with no  pain to assist with prolonged walkng and standing activities associated with ADLs (09/19/2015)   Time 6    Period Weeks   Status On-going   PT LONG TERM GOAL #5   Time 6   Period Weeks   Status On-going               Plan - 08/24/15 1532    Clinical Impression Statement Patient requested complete replacement leg RT.  Stretching and endurance today.  Pain not yet significantly improving yet.  Hamstrings WNL.  Edema much improved in hip and leg.   PT Next Visit Plan Continue modalities. stretch, Nustep, Manual if tolerated, KT taping, continue [place] if reported benefit.     Consulted and Agree with Plan of Care Patient        Problem List Patient Active Problem List   Diagnosis Date Noted  . Sciatica 07/28/2015  . GERD (gastroesophageal reflux disease) 03/07/2015  . Hx of colonic polyps 03/21/2014  . Hematometra 12/04/2012  . Chronic generalized pain 09/11/2012  . Carpal tunnel syndrome, bilateral 06/10/2012  . Generalized anxiety disorder 05/16/2011  . Dysphagia 04/18/2011  . Migraine 04/08/2011  . SYSTOLIC MURMUR 82/42/3536  . Depression 10/17/2010  . Sickle-cell trait 09/28/2009    HARRIS,KAREN 08/24/2015, 3:35 PM  [facility] [address] [place], [place], [address] Phone: [phone]   Fax:  [phone]     Melvenia Needles, PTA 08/24/2015 3:35 PM Phone: [phone] Fax: [phone]

## 2015-08-29 ENCOUNTER — Encounter (HOSPITAL_COMMUNITY): Payer: Self-pay | Admitting: Emergency Medicine

## 2015-08-29 ENCOUNTER — Ambulatory Visit: Payer: Medicare HMO | Admitting: Physical Therapy

## 2015-08-29 ENCOUNTER — Emergency Department (INDEPENDENT_AMBULATORY_CARE_PROVIDER_SITE_OTHER)
Admission: EM | Admit: 2015-08-29 | Discharge: 2015-08-29 | Disposition: A | Discharge status: Home / Self Care | Payer: Medicare HMO | Source: Home / Self Care | Attending: Emergency Medicine | Admitting: Emergency Medicine

## 2015-08-29 DIAGNOSIS — L259 Unspecified contact dermatitis, unspecified cause: Secondary | ICD-10-CM | POA: Diagnosis not present

## 2015-08-29 MED ORDER — TRIAMCINOLONE ACETONIDE 0.1 % EX CREA: 1.0000 "application " | TOPICAL_CREAM | Freq: Two times a day (BID) | CUTANEOUS | Status: DC

## 2015-08-29 MED ORDER — TRIAMCINOLONE ACETONIDE 40 MG/ML IJ SUSP
INTRAMUSCULAR | Status: AC
  Filled 2015-08-29: qty 1

## 2015-08-29 MED ORDER — PREDNISONE 20 MG PO TABS: ORAL_TABLET | ORAL | Status: DC

## 2015-08-29 MED ORDER — TRIAMCINOLONE ACETONIDE 40 MG/ML IJ SUSP
40.0000 mg | Freq: Once | INTRAMUSCULAR | Status: AC
  Administered 2015-08-29: 40 mg via INTRAMUSCULAR

## 2015-08-29 NOTE — ED Notes (Signed)
Reports poison ivy for 2 weeks and areas are spreading.  Predominantly on right arm, right upper chest and new areas on right hip
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
## 2015-08-29 NOTE — Discharge Instructions (Signed)
Poison Ivy  Poison ivy is a inflammation of the skin (contact dermatitis) caused by touching the allergens on the leaves of the ivy plant following previous exposure to the plant. The rash usually appears 48 hours after exposure. The rash is usually bumps (papules) or blisters (vesicles) in a linear pattern. Depending on your own sensitivity, the rash may simply cause redness and itching, or it may also progress to blisters which may break open. These must be well cared for to prevent secondary bacterial (germ) infection, followed by scarring. Keep any open areas dry, clean, dressed, and covered with an antibacterial ointment if needed. The eyes may also get puffy. The puffiness is worst in the morning and gets better as the day progresses. This dermatitis usually heals without scarring, within 2 to 3 weeks without treatment.  HOME CARE INSTRUCTIONS   Thoroughly wash with soap and water as soon as you have been exposed to poison ivy. You have about one half hour to remove the plant resin before it will cause the rash. This washing will destroy the oil or antigen on the skin that is causing, or will cause, the rash. Be sure to wash under your fingernails as any plant resin there will continue to spread the rash. Do not rub skin vigorously when washing affected area. Poison ivy cannot spread if no oil from the plant remains on your body. A rash that has progressed to weeping sores will not spread the rash unless you have not washed thoroughly. It is also important to wash any clothes you have been wearing as these may carry active allergens. The rash will return if you wear the unwashed clothing, even several days later.  Avoidance of the plant in the future is the best measure. Poison ivy plant can be recognized by the number of leaves. Generally, poison ivy has three leaves with flowering branches on a single stem.  Diphenhydramine may be purchased over the counter and used as needed for itching. Do not drive with  this medication if it makes you drowsy.Ask your caregiver about medication for children.  SEEK MEDICAL CARE IF:  · Open sores develop.  · Redness spreads beyond area of rash.  · You notice purulent (pus-like) discharge.  · You have increased pain.  · Other signs of infection develop (such as fever).  Document Released: 12/06/2000 Document Revised: 03/02/2012 Document Reviewed: 05/19/2009  ExitCare® Patient Information ©2015 [employer]. This information is not intended to replace advice given to you by your health care provider. Make sure you discuss any questions you have with your health care provider.    Contact Dermatitis  Contact dermatitis is a reaction to certain substances that touch the skin. Contact dermatitis can be either irritant contact dermatitis or allergic contact dermatitis. Irritant contact dermatitis does not require previous exposure to the substance for a reaction to occur. Allergic contact dermatitis only occurs if you have been exposed to the substance before. Upon a repeat exposure, your body reacts to the substance.   CAUSES   Many substances can cause contact dermatitis. Irritant dermatitis is most commonly caused by repeated exposure to mildly irritating substances, such as:  · Makeup.  · Soaps.  · Detergents.  · Bleaches.  · Acids.  · Metal salts, such as nickel.  Allergic contact dermatitis is most commonly caused by exposure to:  · Poisonous plants.  · Chemicals (deodorants, shampoos).  · Jewelry.  · Latex.  · Neomycin in triple antibiotic cream.  · Preservatives in products, including   clothing.  SYMPTOMS   The area of skin that is exposed may develop:  · Dryness or flaking.  · Redness.  · Cracks.  · Itching.  · Pain or a burning sensation.  · Blisters.  With allergic contact dermatitis, there may also be swelling in areas such as the eyelids, mouth, or genitals.   DIAGNOSIS   Your caregiver can usually tell what the problem is by doing a physical exam. In cases where the cause is  uncertain and an allergic contact dermatitis is suspected, a patch skin test may be performed to help determine the cause of your dermatitis.  TREATMENT  Treatment includes protecting the skin from further contact with the irritating substance by avoiding that substance if possible. Barrier creams, powders, and gloves may be helpful. Your caregiver may also recommend:  · Steroid creams or ointments applied 2 times daily. For best results, soak the rash area in cool water for 20 minutes. Then apply the medicine. Cover the area with a plastic wrap. You can store the steroid cream in the refrigerator for a "chilly" effect on your rash. That may decrease itching. Oral steroid medicines may be needed in more severe cases.  · Antibiotics or antibacterial ointments if a skin infection is present.  · Antihistamine lotion or an antihistamine taken by mouth to ease itching.  · Lubricants to keep moisture in your skin.  · Burow's solution to reduce redness and soreness or to dry a weeping rash. Mix one packet or tablet of solution in 2 cups cool water. Dip a clean washcloth in the mixture, wring it out a bit, and put it on the affected area. Leave the cloth in place for 30 minutes. Do this as often as possible throughout the day.  · Taking several cornstarch or baking soda baths daily if the area is too large to cover with a washcloth.  Harsh chemicals, such as alkalis or acids, can cause skin damage that is like a burn. You should flush your skin for 15 to 20 minutes with cold water after such an exposure. You should also seek immediate medical care after exposure. Bandages (dressings), antibiotics, and pain medicine may be needed for severely irritated skin.   HOME CARE INSTRUCTIONS  · Avoid the substance that caused your reaction.  · Keep the area of skin that is affected away from hot water, soap, sunlight, chemicals, acidic substances, or anything else that would irritate your skin.  · Do not scratch the rash. Scratching  may cause the rash to become infected.  · You may take cool baths to help stop the itching.  · Only take over-the-counter or prescription medicines as directed by your caregiver.  · See your caregiver for follow-up care as directed to make sure your skin is healing properly.  SEEK MEDICAL CARE IF:   · Your condition is not better after 3 days of treatment.  · You seem to be getting worse.  · You see signs of infection such as swelling, tenderness, redness, soreness, or warmth in the affected area.  · You have any problems related to your medicines.  Document Released: 12/06/2000 Document Revised: 03/02/2012 Document Reviewed: 05/14/2011  ExitCare® Patient Information ©2015 [employer]. This information is not intended to replace advice given to you by your health care provider. Make sure you discuss any questions you have with your health care provider.

## 2015-08-31 ENCOUNTER — Ambulatory Visit: Payer: Medicare HMO | Admitting: Physical Therapy

## 2015-08-31 DIAGNOSIS — M62838 Other muscle spasm: Secondary | ICD-10-CM

## 2015-08-31 DIAGNOSIS — M25551 Pain in right hip: Secondary | ICD-10-CM

## 2015-08-31 DIAGNOSIS — R29898 Other symptoms and signs involving the musculoskeletal system: Secondary | ICD-10-CM

## 2015-08-31 DIAGNOSIS — R269 Unspecified abnormalities of gait and mobility: Secondary | ICD-10-CM
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
## 2015-09-05 ENCOUNTER — Ambulatory Visit: Payer: Medicare HMO | Admitting: Physical Therapy

## 2015-09-05 ENCOUNTER — Ambulatory Visit: Payer: Medicare HMO | Admitting: Family Medicine

## 2015-09-05 ENCOUNTER — Ambulatory Visit (INDEPENDENT_AMBULATORY_CARE_PROVIDER_SITE_OTHER): Payer: Medicare HMO | Admitting: Family Medicine

## 2015-09-05 ENCOUNTER — Encounter: Payer: Self-pay | Admitting: Family Medicine

## 2015-09-05 VITALS — BP 118/73 | HR 76 | Temp 98.2°F | Ht 68.0 in | Wt 163.1 lb

## 2015-09-05 DIAGNOSIS — B86 Scabies: Secondary | ICD-10-CM

## 2015-09-05 DIAGNOSIS — M62838 Other muscle spasm: Secondary | ICD-10-CM

## 2015-09-05 DIAGNOSIS — R269 Unspecified abnormalities of gait and mobility: Secondary | ICD-10-CM

## 2015-09-05 DIAGNOSIS — M25551 Pain in right hip: Secondary | ICD-10-CM

## 2015-09-05 DIAGNOSIS — R21 Rash and other nonspecific skin eruption: Secondary | ICD-10-CM | POA: Diagnosis not present

## 2015-09-05 DIAGNOSIS — R29898 Other symptoms and signs involving the musculoskeletal system: Secondary | ICD-10-CM

## 2015-09-05 DIAGNOSIS — R293 Abnormal posture: Secondary | ICD-10-CM

## 2015-09-05 MED ORDER — DIPHENHYDRAMINE HCL 25 MG PO TABS: 25.0000 mg | ORAL_TABLET | Freq: Four times a day (QID) | ORAL | Status: DC | PRN

## 2015-09-05 MED ORDER — TRIAMCINOLONE ACETONIDE 0.1 % EX CREA: 1.0000 "application " | TOPICAL_CREAM | Freq: Two times a day (BID) | CUTANEOUS | Status: DC

## 2015-09-05 MED ORDER — PERMETHRIN 5 % EX CREA: 1.0000 "application " | TOPICAL_CREAM | Freq: Once | CUTANEOUS | Status: DC

## 2015-09-05 NOTE — Progress Notes (Signed)
   Subjective:    Patient ID: Megan Kemp, female    DOB: 1961/02/21, 54 y.o.   MRN: [id number]  HPI  Patient presents for Same Day Appointment  CC: itching  # Rash on right arm, neck, groin:  Seen at urgent care 1 week ago, thought she had poison ivy so was given steroid cream and prednisone  Itching improves with the cream but still very itchy throughout the day  Not painful  Located mostly on her right forearm but has it on her right neck and right groin as well  Was concerned initially about poison ivy exposure, but today says the exposure was when grass was being cut and thought the wind brought it in contact with her skin.   She lives alone, but had stayed at an elderly woman's house whom has multiple people staying there ROS: no fevers/chills  Review of Systems   See HPI for ROS. All other systems reviewed and are negative.  Past medical history, surgical, family, and social history reviewed and updated in the EMR as appropriate.  Objective:  BP 118/73 mmHg  Pulse 76  Temp(Src) 98.2 F (36.8 C) (Oral)  Ht 5\' 8"  (1.727 m)  Wt 163 lb 1 oz (73.965 kg)  BMI 24.80 kg/m2  LMP 05/06/2011 Vitals and nursing note reviewed  General: NAD, has crutches next to her Skin: multiple linear and scattered papular excoriations along right forearm, right neck, right groin.     Assessment & Plan:  Rash, suspect scabies - received 1 week of topical and oral steroids without much relief. Story and appearance of rash, including locations, seem possibly more consistent with scabies infestation with a recent exposure being staying at another person's house shortly before the rash started. Treat permethrin 5% cream once, also given refill for benadryl and triamcinolone cream to help reduce symptoms. Educated regarding cleaning all bed sheets/linens.

## 2015-09-05 NOTE — Patient Instructions (Signed)

## 2015-09-05 NOTE — Patient Instructions (Signed)

## 2015-09-05 NOTE — Therapy (Signed)
[place], [place], [address] Phone: [phone]   Fax:  [phone]  Physical Therapy Treatment  Patient Details  Name: Megan Kemp MRN: [id number] Date of Birth: 09/12/1961 Referring Provider:  Rosemarie Ax, MD  Encounter Date: 09/05/2015      PT End of Session - 09/05/15 1235    Visit Number 8   Number of Visits 12   Date for PT Re-Evaluation 09/19/15   Authorization Type Medicare, KX modifier by 15th visit, progress note by 8th or 9th visit.   PT Start Time 1158  pt arrived 13 minutes late today   PT Stop Time 1245   PT Time Calculation (min) 47 min   Activity Tolerance Patient tolerated treatment well   Behavior During Therapy WFL for tasks assessed/performed      Past Medical History  Diagnosis Date  . DUB (dysfunctional uterine bleeding)   . GERD (gastroesophageal reflux disease)   . Migraine with vertigo   . Memory loss   . Vertigo   . Anxiety   . Panic attacks   . Anemia   . Migraine   . CATARACT, RIGHT EYE 10/13/2009  . FIBROIDS, UTERUS 09/28/2009  . Sickle-cell trait 09/28/2009    Significant past medical history only.  Pt has not had any medical complications 2/2 this problem.    . Fibroid uterus 06/18/2012  . AUDITORY HALLUCINATION 01/09/2011    When depression severe in late 2011 and early 2012- had auditory hallucinations stating.  " suicide, suicide"  No hallucinations at this time.    . Chronic pain syndrome   . Carpal tunnel syndrome, bilateral 2012    denies wanting injection or surgery    Past Surgical History  Procedure Laterality Date  . Tubal ligation    . Endometrial ablation  2007  . Cataract extraction  age 54  . Head & neck wound repair / closure  2001    hit in head with pole  . Laparoscopy  11/11/2011    Procedure: LAPAROSCOPY OPERATIVE;  Surgeon: Osborne Oman, MD;  Location: [facility];  Service: Gynecology;  Laterality: N/A;  . Total abdominal  hysterectomy w/ bilateral salpingoophorectomy  02/10/2013    Performed at [facility]    There were no vitals filed for this visit.  Visit Diagnosis:  Right hip pain  Muscle spasm of right lower extremity  Abnormality of gait  Weakness of right hip  Abnormal posture      Subjective Assessment - 09/05/15 1201    Subjective "I am feeling more pain today, I feel like the pain was from walking around the grocery store"   Currently in Pain? Yes   Pain Score 9    Pain Location Leg   Pain Orientation Right   Pain Descriptors / Indicators Aching;Shooting;Tingling   Pain Onset More than a month ago   Pain Frequency Constant   Aggravating Factors  walking, standing, sidelying   Pain Relieving Factors stretching, KT tape                         [facility] Adult PT Treatment/Exercise - 09/05/15 0001    Lumbar Exercises: Stretches   Active Hamstring Stretch 2 reps;30 seconds   Single Knee to Chest Stretch 2 reps;30 seconds   Lower Trunk Rotation 2 reps;30 seconds   ITB Stretch 2 reps;30 seconds   Piriformis Stretch 2 reps;30 seconds   Moist Heat Therapy  Number Minutes Moist Heat 10 Minutes   Moist Heat Location Hip;Other (comment)  R lateral shin, in L sidelying   Manual Therapy   Myofascial Release DTM rolling with tennis ball, and trigger point release in the R pirifomris/ glute med/ max, and fibularis longus/brevis                PT Education - 09/05/15 1234    Education provided Yes   Education Details dry needling education handout   Person(s) Educated Patient   Methods Explanation   Comprehension Verbalized understanding          PT Short Term Goals - 08/31/15 1657    PT SHORT TERM GOAL #1   Title pt will be I with initial HEP (08/29/2015)   Time 3   Period Weeks   Status Achieved   PT SHORT TERM GOAL #2   Title pt will be able to verbalize and demonstrate techniques to decrease R hip pain via postural awareness, lifting and carrying  mechanics, and HEP (08/29/2015)   Time 3   Period Weeks   Status Achieved           PT Long Term Goals - 09/05/15 1238    PT LONG TERM GOAL #1   Title at discharge pt will be I with all HEP given throughout HEP (09/19/2015)   Time 6   Period Weeks   Status On-going   PT LONG TERM GOAL #2   Title pt will be able to stand/ walk for >30 minutes with < 2/10 pain to assist with funcitonal edurance and community ambulation activities (09/19/2015)   Time 6   Period Weeks   Status On-going   PT LONG TERM GOAL #3   Title She will be demonstrate R hip AROM to WNL with <2/10 pain to help with funcitonal progression (09/19/2015)   Time 6   Period Weeks   Status On-going   PT LONG TERM GOAL #4   Title pt will demonstrate increased RLE strength to > 4+/5 with no  pain to assist with prolonged walkng and standing activities associated with ADLs (09/19/2015)   Time 6   Period Weeks   Status On-going   PT LONG TERM GOAL #5   Title She will increase her FOTO score to > 50 to demonstrate improved funcitonal capacity at discharge (09/19/2015)   Time 6   Period Weeks   Status On-going               Plan - 09/05/15 1235    Clinical Impression Statement Megan Kemp reports that she is increased pain today, and states she didnt do anything outside of her usual tasks besides grocery shopping. Focused todays treatment to decreased pain and muscle spasm via DTM and trigger point release. Following treatment she reported mild relief in pain. Educated about dry needling and provided hanout, pt stated she would think about it and let her therapist know next visit if she wanted to get scheduled for it.    PT Next Visit Plan assess prone press ups, continue Nu step, manual and taping and [place] prn, dry needling? MEDICARE PROGRESS NOTE   PT Home Exercise Plan dry needling education        Problem List Patient Active Problem List   Diagnosis Date Noted  . Sciatica 07/28/2015  . GERD (gastroesophageal  reflux disease) 03/07/2015  . Hx of colonic polyps 03/21/2014  . Hematometra 12/04/2012  . Chronic generalized pain 09/11/2012  . Carpal tunnel syndrome, bilateral 06/10/2012  .  Generalized anxiety disorder 05/16/2011  . Dysphagia 04/18/2011  . Migraine 04/08/2011  . SYSTOLIC MURMUR 37/85/8850  . Depression 10/17/2010  . Sickle-cell trait 09/28/2009   Starr Lake PT, DPT, LAT, ATC  09/05/2015  12:51 PM      [facility] [address] [place], [place], [address] Phone: [phone]   Fax:  [phone]

## 2015-09-07 ENCOUNTER — Ambulatory Visit: Payer: Medicare HMO | Admitting: Physical Therapy

## 2015-09-07 DIAGNOSIS — M25551 Pain in right hip: Secondary | ICD-10-CM

## 2015-09-07 DIAGNOSIS — R29898 Other symptoms and signs involving the musculoskeletal system: Secondary | ICD-10-CM

## 2015-09-07 DIAGNOSIS — M62838 Other muscle spasm: Secondary | ICD-10-CM

## 2015-09-07 NOTE — Therapy (Signed)
[place], [place], [address] Phone: [phone]   Fax:  [phone]  Physical Therapy Treatment  Patient Details  Name: Megan Kemp MRN: [id number] Date of Birth: 15-Aug-1961 Referring Provider:  Rosemarie Ax, MD  Encounter Date: 09/07/2015 Visit number:9 Date of re-eval: 09/19/2015 PT start [id number] PT end time:1500     PT End of Session - 09/07/15 1609    PT Start Time 1429   PT Stop Time 1500   PT Time Calculation (min) 31 min   Activity Tolerance Patient tolerated treatment well;No increased pain   Behavior During Therapy [facility] for tasks assessed/performed      Past Medical History  Diagnosis Date  . DUB (dysfunctional uterine bleeding)   . GERD (gastroesophageal reflux disease)   . Migraine with vertigo   . Memory loss   . Vertigo   . Anxiety   . Panic attacks   . Anemia   . Migraine   . CATARACT, RIGHT EYE 10/13/2009  . FIBROIDS, UTERUS 09/28/2009  . Sickle-cell trait 09/28/2009    Significant past medical history only.  Pt has not had any medical complications 2/2 this problem.    . Fibroid uterus 06/18/2012  . AUDITORY HALLUCINATION 01/09/2011    When depression severe in late 2011 and early 2012- had auditory hallucinations stating.  " suicide, suicide"  No hallucinations at this time.    . Chronic pain syndrome   . Carpal tunnel syndrome, bilateral 2012    denies wanting injection or surgery    Past Surgical History  Procedure Laterality Date  . Tubal ligation    . Endometrial ablation  2007  . Cataract extraction  age 76  . Head & neck wound repair / closure  2001    hit in head with pole  . Laparoscopy  11/11/2011    Procedure: LAPAROSCOPY OPERATIVE;  Surgeon: Osborne Oman, MD;  Location: [facility];  Service: Gynecology;  Laterality: N/A;  . Total abdominal hysterectomy w/ bilateral salpingoophorectomy  02/10/2013    Performed at [facility]    There were no  vitals filed for this visit.  Visit Diagnosis:  Weakness of right hip  Muscle spasm of right lower extremity  Right hip pain      Subjective Assessment - 09/07/15 1430    Subjective  9/10 pain,  Rt lateral leg.  tingling    Currently in Pain? Yes  see above.                         [facility] Adult PT Treatment/Exercise - 09/07/15 0001    Lumbar Exercises: Stretches   Standing Extension --  2 reps 10 seconds.   Knee/Hip Exercises: Aerobic   Nustep L7 5 minutes HR 110                PT Education - 09/07/15 1459    Education provided Yes   Education Details ankle band   Person(s) Educated Patient   Methods Explanation;Demonstration;Verbal cues;Handout;Tactile cues   Comprehension Verbalized understanding;Returned demonstration          PT Short Term Goals - 08/31/15 1657    PT SHORT TERM GOAL #1   Title pt will be I with initial HEP (08/29/2015)   Time 3   Period Weeks   Status Achieved   PT SHORT TERM GOAL #2   Title pt will be able to verbalize and demonstrate techniques to decrease R  hip pain via postural awareness, lifting and carrying mechanics, and HEP (08/29/2015)   Time 3   Period Weeks   Status Achieved           PT Long Term Goals - 09/18/2015 1615    PT LONG TERM GOAL #1   Title at discharge pt will be I with all HEP given throughout HEP (09/19/2015)   Baseline independent with exercises given so far   Time 6   Period Weeks   Status On-going   PT LONG TERM GOAL #2   Title pt will be able to stand/ walk for >30 minutes with < 2/10 pain to assist with funcitonal edurance and community ambulation activities (09/19/2015)   Baseline 20 minutes with 9/10 pain(Using crutches today)   Time 6   Period Weeks   Status On-going   PT LONG TERM GOAL #3   Title She will be demonstrate R hip AROM to WNL with <2/10 pain to help with funcitonal progression (09/19/2015)   Time 6   Period Weeks   PT LONG TERM GOAL #4   Title pt will demonstrate  increased RLE strength to > 4+/5 with no  pain to assist with prolonged walkng and standing activities associated with ADLs (09/19/2015)   Time 6   Period Weeks   Status On-going   PT LONG TERM GOAL #5   Title She will increase her FOTO score to > 50 to demonstrate improved funcitonal capacity at discharge (09/19/2015)   Time 6   Period Weeks   Status On-going               Plan - 09/18/15 1611    Clinical Impression Statement Lower leg pain continues.  Advanced home exercise to include ankle with bands /strengthening.  She  requests tape each visit.  She was able to get her HR to 110 on nustep.  Back extension increases leg pain standing          G-Codes - 09/18/15 1811    Functional Assessment Tool Used FOTO 74% limitation   Functional Limitation Mobility: Walking and moving around   Mobility: Walking and Moving Around Current Status [phone]) At least 60 percent but less than 80 percent impaired, limited or restricted   Mobility: Walking and Moving Around Goal Status [phone]) At least 40 percent but less than 60 percent impaired, limited or restricted      Problem List Patient Active Problem List   Diagnosis Date Noted  . Sciatica 07/28/2015  . GERD (gastroesophageal reflux disease) 03/07/2015  . Hx of colonic polyps 03/21/2014  . Hematometra 12/04/2012  . Chronic generalized pain 09/11/2012  . Carpal tunnel syndrome, bilateral 06/10/2012  . Generalized anxiety disorder 05/16/2011  . Dysphagia 04/18/2011  . Migraine 04/08/2011  . SYSTOLIC MURMUR 62/83/6629  . Depression 10/17/2010  . Sickle-cell trait 09/28/2009    Starr Lake Sep 18, 2015, 6:11 PM  [facility] [address] [place], [place], [address] Phone: [phone]   Fax:  [phone]     Melvenia Needles, PTA Sep 18, 2015 6:11 PM Phone: [phone] Fax: [phone]      Physical Therapy Progress Note  Dates of Reporting Period: 08/08/2015   to September 18, 2015  Objective Reports of Subjective Statement:   9/10 pain,  Rt lateral leg.  Tingling   Objective Measurements: see flow sheet   Goal Update: no new goals met this visit  Plan: continue with current POC   Reason Skilled Services are Required: hip/ leg pain and tingling, limited  endurance and walking/standing, muscle tightness.

## 2015-09-07 NOTE — Patient Instructions (Signed)
Dorsiflexion: Resisted   Facing anchor, tubing around left foot, pull toward face.  Repeat _10___ times per set. Do _1-2___ sets per session. Do _1___ sessions per day.  http://orth.exer.us/9   Copyright  VHI. All rights reserved.  Strengthening Inversion (Resistive)   Secure resistive band or tubing around stable furniture. Place right foot, near toes, in loop. With outside of foot toward furniture, pull toward big toe as you roll foot upward. Repeat 10 - 20____ times. Do _1___ sessions per day.  http://gt2.exer.us/428   Copyright  VHI. All rights reserved.  Strengthening Eversion (Resistive)   Secure resistive band or tubing around stable furniture. Place left foot, near toes, in other end of loop. With inside of foot toward furniture, pull toward little toe as you roll outer edge of foot up. Repeat 10 - 20____ times. Do ____ sessions per day.  http://gt2.exer.us/426   Copyright  VHI. All rights reserved.

## 2015-09-14 ENCOUNTER — Ambulatory Visit: Payer: Medicare HMO | Admitting: Physical Therapy

## 2015-09-14 DIAGNOSIS — M62838 Other muscle spasm: Secondary | ICD-10-CM

## 2015-09-14 DIAGNOSIS — M25551 Pain in right hip: Secondary | ICD-10-CM | POA: Diagnosis not present

## 2015-09-14 DIAGNOSIS — R29898 Other symptoms and signs involving the musculoskeletal system: Secondary | ICD-10-CM

## 2015-09-14 NOTE — Therapy (Addendum)
[facility] [address] [place], [place], [address] Phone: [phone]   Fax:  [phone]  Physical Therapy Treatment  Patient Details  Name: Megan Kemp MRN: [id number] Date of Birth: 29-Apr-1961 Referring Shatyra Becka:  Myra Rude, MD  Encounter Date: 09/14/2015      PT End of Session - 09/14/15 1431    PT Start Time 1337   PT Stop Time 1420   PT Time Calculation (min) 43 min   Activity Tolerance Patient tolerated treatment well;No increased pain   Behavior During Therapy [facility] for tasks assessed/performed      Past Medical History  Diagnosis Date  . DUB (dysfunctional uterine bleeding)   . GERD (gastroesophageal reflux disease)   . Migraine with vertigo   . Memory loss   . Vertigo   . Anxiety   . Panic attacks   . Anemia   . Migraine   . CATARACT, RIGHT EYE 10/13/2009  . FIBROIDS, UTERUS 09/28/2009  . Sickle-cell trait 09/28/2009    Significant past medical history only.  Pt has not had any medical complications 2/2 this problem.    . Fibroid uterus 06/18/2012  . AUDITORY HALLUCINATION 01/09/2011    When depression severe in late 2011 and early 2012- had auditory hallucinations stating.  " suicide, suicide"  No hallucinations at this time.    . Chronic pain syndrome   . Carpal tunnel syndrome, bilateral 2012    denies wanting injection or surgery    Past Surgical History  Procedure Laterality Date  . Tubal ligation    . Endometrial ablation  2007  . Cataract extraction  age 53  . Head & neck wound repair / closure  2001    hit in head with pole  . Laparoscopy  11/11/2011    Procedure: LAPAROSCOPY OPERATIVE;  Surgeon: Tereso Newcomer, MD;  Location: [facility];  Service: Gynecology;  Laterality: N/A;  . Total abdominal hysterectomy w/ bilateral salpingoophorectomy  02/10/2013    Performed at [facility]    There were no vitals filed for this visit.  Visit Diagnosis:  Weakness of right hip  Muscle  spasm of right lower extremity  Right hip pain      Subjective Assessment - 09/14/15 1346    Subjective RT hip and leg pain 8/10,  doing the band exercises with red, ready for the green band.   Pain Score 8    Pain Location Leg   Pain Orientation Right   Pain Descriptors / Indicators Aching;Tingling   Pain Radiating Towards to toes   Pain Frequency Constant   Aggravating Factors  walking, standing, sidelying.     Pain Relieving Factors tape stretching.     Multiple Pain Sites No                         [facility] Adult PT Treatment/Exercise - 09/14/15 1358    Lumbar Exercises: Stretches   Double Knee to Chest Stretch Limitations Feet on ball 10 X monitored fro neutral spine.     Lower Trunk Rotation Limitations legs on ball 10 X stiff initially.   Knee/Hip Exercises: Stretches   Other Knee/Hip Stretches all hip ROM gently stretched except for extension.     Knee/Hip Exercises: Aerobic   Nustep L7 10 minutes.   Knee/Hip Exercises: Supine   Heel Slides 10 reps  with neutral back monitoring   Bridges Limitations 10 X   Other Supine Knee/Hip Exercises HIP AROM  Flexion limited 10%, Hip ABD limited 10%,  Hip ER WNL, Hip IR limited 50 % hip extensionstanding WNL  RT   Manual Therapy   Manual Therapy Taping   Kinesiotex Edema  and I band for support Lower leg.                    PT Short Term Goals - 08/31/15 1657    PT SHORT TERM GOAL #1   Title pt will be I with initial HEP (08/29/2015)   Time 3   Period Weeks   Status Achieved   PT SHORT TERM GOAL #2   Title pt will be able to verbalize and demonstrate techniques to decrease R hip pain via postural awareness, lifting and carrying mechanics, and HEP (08/29/2015)   Time 3   Period Weeks   Status Achieved           PT Long Term Goals - 09/14/15 1436    PT LONG TERM GOAL #1   Title at discharge pt will be I with all HEP given throughout HEP (09/19/2015)   Baseline independent with exercises given so  far   Time 6   Period Weeks   Status On-going   PT LONG TERM GOAL #2   Title pt will be able to stand/ walk for >30 minutes with < 2/10 pain to assist with funcitonal edurance and community ambulation activities (09/19/2015)   Baseline 8/10 pain with walking, uses 2 crutches.  duration not assessed.   Time 6   Period Weeks   Status On-going   PT LONG TERM GOAL #3   Title She will be demonstrate R hip AROM to WNL with <2/10 pain to help with funcitonal progression (09/19/2015)   Baseline ER and EXtension RT WNL, other motions limited.   Time 6   Period Weeks   Status Partially Met   PT LONG TERM GOAL #4   Title pt will demonstrate increased RLE strength to > 4+/5 with no  pain to assist with prolonged walkng and standing activities associated with ADLs (09/19/2015)   Time 6   Period Weeks   Status Unable to assess   PT LONG TERM GOAL #5   Title She will increase her FOTO score to > 50 to demonstrate improved funcitonal capacity at discharge (09/19/2015)   Time 6   Period Weeks   Status Unable to assess               Plan - 09/14/15 1433    Clinical Impression Statement ROM improving IR limited 50 %.  She has a POC 09/19/2015  She wants to continue PT, SO she must see Gaylyn Rong.  He will decide ifd she needs renewal.     PT Next Visit Plan She declines dry needle.     PT Home Exercise Plan Gentle stretches.     Consulted and Agree with Plan of Care Patient        Problem List Patient Active Problem List   Diagnosis Date Noted  . Sciatica 07/28/2015  . GERD (gastroesophageal reflux disease) 03/07/2015  . Hx of colonic polyps 03/21/2014  . Hematometra 12/04/2012  . Chronic generalized pain 09/11/2012  . Carpal tunnel syndrome, bilateral 06/10/2012  . Generalized anxiety disorder 05/16/2011  . Dysphagia 04/18/2011  . Migraine 04/08/2011  . SYSTOLIC MURMUR 01/16/2011  . Depression 10/17/2010  . Sickle-cell trait 09/28/2009    HARRIS,KAREN 09/14/2015, 2:48 PM  [facility] Outpatient [facility] [address] [place],  [place], [address] Phone: [phone]   Fax:  [phone]     Liz Beach, PTA 09/14/2015 2:48 PM Phone: [phone] Fax: [phone]      Lulu Riding PT, DPT, LAT, ATC  09/14/2015  5:54 PM

## 2015-10-02 ENCOUNTER — Ambulatory Visit: Payer: Medicare HMO | Attending: Family Medicine | Admitting: Physical Therapy

## 2015-10-02 DIAGNOSIS — R29898 Other symptoms and signs involving the musculoskeletal system: Secondary | ICD-10-CM | POA: Diagnosis present

## 2015-10-02 DIAGNOSIS — R293 Abnormal posture: Secondary | ICD-10-CM | POA: Diagnosis present

## 2015-10-02 DIAGNOSIS — M25551 Pain in right hip: Secondary | ICD-10-CM

## 2015-10-02 DIAGNOSIS — M62838 Other muscle spasm: Secondary | ICD-10-CM | POA: Diagnosis present

## 2015-10-02 DIAGNOSIS — R269 Unspecified abnormalities of gait and mobility: Secondary | ICD-10-CM | POA: Diagnosis present

## 2015-10-02 NOTE — Therapy (Signed)
[place], [place], [address] Phone: [phone]   Fax:  [phone]  Physical Therapy Treatment / Re-certification  Patient Details  Name: Megan Kemp MRN: [id number] Date of Birth: May 15, 1961 Referring Provider:  Rosemarie Ax, MD  Encounter Date: 10/02/2015      PT End of Session - 10/02/15 1743    Visit Number 13   Number of Visits 21   Date for PT Re-Evaluation 10/30/15   Authorization Type Medicare, KX modifier by 15th visit, progress note by 8th or 9th visit.   PT Start Time 1415   PT Stop Time 1500   PT Time Calculation (min) 45 min   Activity Tolerance Patient tolerated treatment well   Behavior During Therapy WFL for tasks assessed/performed      Past Medical History  Diagnosis Date  . DUB (dysfunctional uterine bleeding)   . GERD (gastroesophageal reflux disease)   . Migraine with vertigo   . Memory loss   . Vertigo   . Anxiety   . Panic attacks   . Anemia   . Migraine   . CATARACT, RIGHT EYE 10/13/2009  . FIBROIDS, UTERUS 09/28/2009  . Sickle-cell trait 09/28/2009    Significant past medical history only.  Pt has not had any medical complications 2/2 this problem.    . Fibroid uterus 06/18/2012  . AUDITORY HALLUCINATION 01/09/2011    When depression severe in late 2011 and early 2012- had auditory hallucinations stating.  " suicide, suicide"  No hallucinations at this time.    . Chronic pain syndrome   . Carpal tunnel syndrome, bilateral 2012    denies wanting injection or surgery    Past Surgical History  Procedure Laterality Date  . Tubal ligation    . Endometrial ablation  2007  . Cataract extraction  age 54  . Head & neck wound repair / closure  2001    hit in head with pole  . Laparoscopy  11/11/2011    Procedure: LAPAROSCOPY OPERATIVE;  Surgeon: Osborne Oman, MD;  Location: [facility];  Service: Gynecology;  Laterality: N/A;  . Total abdominal hysterectomy w/  bilateral salpingoophorectomy  02/10/2013    Performed at [facility]    There were no vitals filed for this visit.  Visit Diagnosis:  Weakness of right hip - Plan: PT plan of care cert/re-cert  Muscle spasm of right lower extremity - Plan: PT plan of care cert/re-cert  Right hip pain - Plan: PT plan of care cert/re-cert  Abnormality of gait - Plan: PT plan of care cert/re-cert  Abnormal posture - Plan: PT plan of care cert/re-cert      Subjective Assessment - 10/02/15 1506    Subjective "I am feeling a little sore today rated at an 8/10"   Currently in Pain? Yes   Pain Score 8    Pain Location Leg   Pain Orientation Right   Pain Descriptors / Indicators Aching;Sharp   Pain Type Chronic pain   Pain Radiating Towards down to right toes   Aggravating Factors  walking/standing,             [facility] PT Assessment - 10/02/15 0001    Assessment   Medical Diagnosis R Sciatic   Hand Dominance Right   Next MD Visit unknown   Precautions   Precautions None   Restrictions   Weight Bearing Restrictions No   Balance Screen   Has the patient fallen in the past 6 months  No   Has the patient had a decrease in activity level because of a fear of falling?  No   Is the patient reluctant to leave their home because of a fear of falling?  No   Home Environment   Living Environment Private residence   Living Arrangements Alone   Type of [place] to enter   Entrance Stairs-Number of Steps 3   Entrance Stairs-Rails Can reach both   [place] One level   [place] None   Prior Function   Level of Independence Independent;Independent with basic ADLs   Vocation On disability   Leisure sports, grandchildren, cooking   Cognition   Overall Cognitive Status Within Functional Limits for tasks assessed   Observation/Other Assessments   Focus on Therapeutic Outcomes (FOTO)  28% limited   AROM   Right Hip Extension 20   Right Hip Flexion 130   Right  Hip ABduction 26   Lumbar Flexion 100   Lumbar Extension 30   Lumbar - Right Side Bend 42   Lumbar - Left Side Bend 42   Strength   Right/Left Hip Right;Left   Right Hip Flexion 4+/5   Right Hip ABduction 4+/5  pain during testing   Right Hip ADduction 4+/5   Left Hip Flexion 4+/5   Left Hip ABduction 4+/5   Left Hip ADduction 4+/5                     [facility] Adult PT Treatment/Exercise - 10/02/15 1511    Lumbar Exercises: Stretches   Passive Hamstring Stretch 2 reps;30 seconds   Single Knee to Chest Stretch 2 reps;30 seconds   Lower Trunk Rotation 3 reps;20 seconds   Piriformis Stretch 2 reps;30 seconds   Lumbar Exercises: Aerobic   Stationary Bike NuStep L6 x 6 min   Manual Therapy   Myofascial Release DTM rolling with tennis ball, and trigger point release in the R pirifomris/ glute med/ max, and fibularis longus/brevis   Kinesiotex Edema                PT Education - 10/02/15 1742    Education provided Yes   Education Details HEP review   Person(s) Educated Patient   Methods Explanation   Comprehension Verbalized understanding          PT Short Term Goals - 08/31/15 1657    PT SHORT TERM GOAL #1   Title pt will be I with initial HEP (08/29/2015)   Time 3   Period Weeks   Status Achieved   PT SHORT TERM GOAL #2   Title pt will be able to verbalize and demonstrate techniques to decrease R hip pain via postural awareness, lifting and carrying mechanics, and HEP (08/29/2015)   Time 3   Period Weeks   Status Achieved           PT Long Term Goals - 10/02/15 1747    PT LONG TERM GOAL #1   Title at discharge pt will be I with all HEP given throughout HEP (09/19/2015)   Baseline independent with exercises given so far   Time 6   Period Weeks   Status On-going   PT LONG TERM GOAL #2   Title pt will be able to stand/ walk for >30 minutes with < 2/10 pain to assist with funcitonal edurance and community ambulation activities (09/19/2015)    Baseline 8/10 pain with walking, uses 2 crutches.  duration not assessed.  Time 6   Period Weeks   Status On-going   PT LONG TERM GOAL #3   Title She will be demonstrate R hip AROM to WNL with <2/10 pain to help with funcitonal progression (09/19/2015)   Baseline ER and EXtension RT WNL, other motions limited.   Time 6   Period Weeks   Status On-going   PT LONG TERM GOAL #4   Title pt will demonstrate increased RLE strength to > 4+/5 with no  pain to assist with prolonged walkng and standing activities associated with ADLs (09/19/2015)   Time 6   Period Weeks   Status On-going   PT LONG TERM GOAL #5   Title She will increase her FOTO score to > 50 to demonstrate improved funcitonal capacity at discharge (09/19/2015)   Baseline FOTO score 62 19-Oct-2015   Time 6   Period Weeks   Status Achieved               Plan - 10-19-15 1743    Clinical Impression Statement Megan Kemp has made progress with increased mobility in the low back and strength in bil hip. She met LTG #5 this visit.  She continues to demonstrate pain with muscle tightness in the R hip around the glute med/minimus with referral of symptoms to the lateral leg upon palpation. She reported some relief with DTM and with the KT tape. Plan to continue with current POC to work toward her goals.    Pt will benefit from skilled therapeutic intervention in order to improve on the following deficits Abnormal gait;Decreased activity tolerance;Decreased balance;Decreased endurance;Decreased range of motion;Difficulty walking;Impaired tone;Pain;Improper body mechanics;Postural dysfunction;Decreased strength;Decreased knowledge of use of DME;Impaired perceived functional ability;Hypomobility   PT Frequency 2x / week   PT Duration 4 weeks   PT Treatment/Interventions ADLs/Self Care Home Management;Cryotherapy;Electrical Stimulation;Moist Heat;Ultrasound;Functional mobility training;Therapeutic activities;Therapeutic exercise;Balance  training;Neuromuscular re-education;Patient/family education;Manual techniques;Passive range of motion;Dry needling;Taping   PT Next Visit Plan stretching, manual for muscle tightness in the R hip, gait training with cane. KT tape, modalities PRN   PT Home Exercise Plan HEP review   Consulted and Agree with Plan of Care Patient          G-Codes - 10/19/15 1749    Functional Assessment Tool Used FOTO 28% limited   Functional Limitation Mobility: Walking and moving around   Mobility: Walking and Moving Around Current Status [phone]) At least 20 percent but less than 40 percent impaired, limited or restricted   Mobility: Walking and Moving Around Goal Status [phone]) At least 40 percent but less than 60 percent impaired, limited or restricted      Problem List Patient Active Problem List   Diagnosis Date Noted  . Sciatica 07/28/2015  . GERD (gastroesophageal reflux disease) 03/07/2015  . Hx of colonic polyps 03/21/2014  . Hematometra 12/04/2012  . Chronic generalized pain 09/11/2012  . Carpal tunnel syndrome, bilateral 06/10/2012  . Generalized anxiety disorder 05/16/2011  . Dysphagia 04/18/2011  . Migraine 04/08/2011  . SYSTOLIC MURMUR 95/08/3266  . Depression 10/17/2010  . Sickle-cell trait ([place]) 09/28/2009   Starr Lake PT, DPT, LAT, ATC  October 19, 2015  5:53 PM      [name] [address] [place], [place], [address] Phone: [phone]   Fax:  [phone]

## 2015-10-17 ENCOUNTER — Ambulatory Visit: Payer: Medicare HMO | Admitting: Physical Therapy

## 2015-10-17 DIAGNOSIS — R269 Unspecified abnormalities of gait and mobility: Secondary | ICD-10-CM

## 2015-10-17 DIAGNOSIS — R29898 Other symptoms and signs involving the musculoskeletal system: Secondary | ICD-10-CM | POA: Diagnosis not present

## 2015-10-17 DIAGNOSIS — R293 Abnormal posture: Secondary | ICD-10-CM

## 2015-10-17 DIAGNOSIS — M62838 Other muscle spasm: Secondary | ICD-10-CM

## 2015-10-17 DIAGNOSIS — M25551 Pain in right hip: Secondary | ICD-10-CM

## 2015-10-17 NOTE — Therapy (Signed)
[place], [place], [address] Phone: [phone]   Fax:  [phone]  Physical Therapy Treatment  Patient Details  Name: Megan Kemp MRN: [id number] Date of Birth: 1961-05-21 No Data Recorded  Encounter Date: 10/17/2015      PT End of Session - 10/17/15 1425    Visit Number 14   Number of Visits 21   Date for PT Re-Evaluation 10/30/15   Authorization Type Medicare, KX modifier by 15th visit, progress note by 8th or 9th visit.   PT Start Time 1100   PT Stop Time 1205   PT Time Calculation (min) 65 min   Activity Tolerance Patient tolerated treatment well      Past Medical History  Diagnosis Date  . DUB (dysfunctional uterine bleeding)   . GERD (gastroesophageal reflux disease)   . Migraine with vertigo   . Memory loss   . Vertigo   . Anxiety   . Panic attacks   . Anemia   . Migraine   . CATARACT, RIGHT EYE 10/13/2009  . FIBROIDS, UTERUS 09/28/2009  . Sickle-cell trait 09/28/2009    Significant past medical history only.  Pt has not had any medical complications 2/2 this problem.    . Fibroid uterus 06/18/2012  . AUDITORY HALLUCINATION 01/09/2011    When depression severe in late 2011 and early 2012- had auditory hallucinations stating.  " suicide, suicide"  No hallucinations at this time.    . Chronic pain syndrome   . Carpal tunnel syndrome, bilateral 2012    denies wanting injection or surgery    Past Surgical History  Procedure Laterality Date  . Tubal ligation    . Endometrial ablation  2007  . Cataract extraction  age 17  . Head & neck wound repair / closure  2001    hit in head with pole  . Laparoscopy  11/11/2011    Procedure: LAPAROSCOPY OPERATIVE;  Surgeon: Osborne Oman, MD;  Location: [facility];  Service: Gynecology;  Laterality: N/A;  . Total abdominal hysterectomy w/ bilateral salpingoophorectomy  02/10/2013    Performed at [facility]    There were no vitals  filed for this visit.  Visit Diagnosis:  Weakness of right hip  Muscle spasm of right lower extremity  Right hip pain  Abnormality of gait  Abnormal posture      Subjective Assessment - 10/17/15 1249    Subjective "I am struggling today"    Currently in Pain? Yes   Pain Score 9    Pain Location Hip   Pain Orientation Right   Pain Descriptors / Indicators Aching   Pain Type Chronic pain   Pain Onset More than a month ago   Pain Frequency Constant   Aggravating Factors  walking   Pain Relieving Factors tape, stretching, estim   Multiple Pain Sites No                         [facility] Adult PT Treatment/Exercise - 10/17/15 1438    Lumbar Exercises: Stretches   Active Hamstring Stretch 2 reps;30 seconds   Active Hamstring Stretch Limitations manual hold/relax   Piriformis Stretch 2 reps;30 seconds   Piriformis Stretch Limitations manual hold/relax   Knee/Hip Exercises: Aerobic   Nustep L7 8 minutes.   Knee/Hip Exercises: Supine   Heel Slides Strengthening;20 reps  with neutral back monitoring   Heel Slides Limitations with feet on physioball   Hip Adduction  Isometric 2 sets10 reps   Bridges Limitations 2 sets   Bridges with [employer] 20 reps   Other Supine Knee/Hip Exercises lumbar rotation with ball 2sets 20 reps   Other Supine Knee/Hip Exercises Hip Abd (limited) 10 reps   Electrical Stimulation   Electrical Stimulation Location Piriformis RT,    Electrical Stimulation Action IFC   Electrical Stimulation Parameters 34min   Electrical Stimulation Goals Pain                  PT Short Term Goals - 08/31/15 1657    PT SHORT TERM GOAL #1   Title pt will be I with initial HEP (08/29/2015)   Time 3   Period Weeks   Status Achieved   PT SHORT TERM GOAL #2   Title pt will be able to verbalize and demonstrate techniques to decrease R hip pain via postural awareness, lifting and carrying mechanics, and HEP (08/29/2015)   Time 3   Period Weeks    Status Achieved           PT Long Term Goals - 10/02/15 1747    PT LONG TERM GOAL #1   Title at discharge pt will be I with all HEP given throughout HEP (09/19/2015)   Baseline independent with exercises given so far   Time 6   Period Weeks   Status On-going   PT LONG TERM GOAL #2   Title pt will be able to stand/ walk for >30 minutes with < 2/10 pain to assist with funcitonal edurance and community ambulation activities (09/19/2015)   Baseline 8/10 pain with walking, uses 2 crutches.  duration not assessed.   Time 6   Period Weeks   Status On-going   PT LONG TERM GOAL #3   Title She will be demonstrate R hip AROM to WNL with <2/10 pain to help with funcitonal progression (09/19/2015)   Baseline ER and EXtension RT WNL, other motions limited.   Time 6   Period Weeks   Status On-going   PT LONG TERM GOAL #4   Title pt will demonstrate increased RLE strength to > 4+/5 with no  pain to assist with prolonged walkng and standing activities associated with ADLs (09/19/2015)   Time 6   Period Weeks   Status On-going   PT LONG TERM GOAL #5   Title She will increase her FOTO score to > 50 to demonstrate improved funcitonal capacity at discharge (09/19/2015)   Baseline FOTO score 62 10/02/2015   Time 6   Period Weeks   Status Achieved               Plan - 10/17/15 1429    Clinical Impression Statement Pt. continues to demonstrate pain and muscle tightness through the R hip around the glute med/minimus with referred symptoms to the lateral leg (per patient report). Pt. tolerated all ther ex with no increase in pain and reported a decrease in pain after interferential estim treatment over right greater trochanter.   PT Next Visit Plan Stretching for muscle tightness, and would recommend KT tape for pain control, hip strengthening       During this treatment session, the therapist was present, participating in and directing the treatment.  Problem List Patient Active Problem  List   Diagnosis Date Noted  . Sciatica 07/28/2015  . GERD (gastroesophageal reflux disease) 03/07/2015  . Hx of colonic polyps 03/21/2014  . Hematometra 12/04/2012  . Chronic generalized pain 09/11/2012  . Carpal tunnel syndrome,  bilateral 06/10/2012  . Generalized anxiety disorder 05/16/2011  . Dysphagia 04/18/2011  . Migraine 04/08/2011  . SYSTOLIC MURMUR 34/14/4360  . Depression 10/17/2010  . Sickle-cell trait ([place]) 09/28/2009   Hessie Diener, PTA 10/17/2015 3:00 PM Phone: [phone] Fax: [place], [place] 10/17/2015 2:45 PM PHONE:[phone] [facility] [place], [place], [address] Phone: [phone]   Fax:  [phone]  Name: Megan Kemp MRN: [id number] Date of Birth: June 13, 1961

## 2015-10-19 ENCOUNTER — Ambulatory Visit: Payer: Medicare HMO | Admitting: Physical Therapy

## 2015-10-19 DIAGNOSIS — M25551 Pain in right hip: Secondary | ICD-10-CM

## 2015-10-19 DIAGNOSIS — R269 Unspecified abnormalities of gait and mobility: Secondary | ICD-10-CM

## 2015-10-19 DIAGNOSIS — M62838 Other muscle spasm: Secondary | ICD-10-CM

## 2015-10-19 DIAGNOSIS — R29898 Other symptoms and signs involving the musculoskeletal system: Secondary | ICD-10-CM

## 2015-10-19 DIAGNOSIS — R293 Abnormal posture: Secondary | ICD-10-CM

## 2015-10-19 NOTE — Therapy (Signed)
[place], [place], [address] Phone: [phone]   Fax:  [phone]  Physical Therapy Treatment  Patient Details  Name: Megan Kemp MRN: [id number] Date of Birth: [birth year] No Data Recorded  Encounter Date: 10/19/2015      PT End of Session - 10/19/15 1147    Visit Number 15   Number of Visits 21   Date for PT Re-Evaluation 10/30/15   Authorization Type Medicare, KX modifier, progress note by 8th or 9th visit.   PT Start Time 1100   PT Stop Time 1200   PT Time Calculation (min) 60 min   Activity Tolerance Patient tolerated treatment well   Behavior During Therapy WFL for tasks assessed/performed      Past Medical History  Diagnosis Date  . DUB (dysfunctional uterine bleeding)   . GERD (gastroesophageal reflux disease)   . Migraine with vertigo   . Memory loss   . Vertigo   . Anxiety   . Panic attacks   . Anemia   . Migraine   . CATARACT, RIGHT EYE 10/13/2009  . FIBROIDS, UTERUS 09/28/2009  . Sickle-cell trait 09/28/2009    Significant past medical history only.  Pt has not had any medical complications 2/2 this problem.    . Fibroid uterus 06/18/2012  . AUDITORY HALLUCINATION 01/09/2011    When depression severe in late 2011 and early 2012- had auditory hallucinations stating.  " suicide, suicide"  No hallucinations at this time.    . Chronic pain syndrome   . Carpal tunnel syndrome, bilateral 2012    denies wanting injection or surgery    Past Surgical History  Procedure Laterality Date  . Tubal ligation    . Endometrial ablation  2007  . Cataract extraction  age 24  . Head & neck wound repair / closure  2001    hit in head with pole  . Laparoscopy  11/11/2011    Procedure: LAPAROSCOPY OPERATIVE;  Surgeon: Osborne Oman, MD;  Location: [facility];  Service: Gynecology;  Laterality: N/A;  . Total abdominal hysterectomy w/ bilateral salpingoophorectomy  02/10/2013    Performed at [facility]    There were no vitals filed for this visit.  Visit Diagnosis:  Weakness of right hip  Muscle spasm of right lower extremity  Right hip pain  Abnormality of gait  Abnormal posture      Subjective Assessment - 10/19/15 1109    Subjective "I am doing better today than yesterday but still am having some pain in the hip that is more in the hip today"    Currently in Pain? Yes   Pain Location Hip   Pain Orientation Right   Pain Descriptors / Indicators Aching   Pain Onset More than a month ago   Pain Frequency Constant                         [facility] Adult PT Treatment/Exercise - 10/19/15 0001    Lumbar Exercises: Stretches   Active Hamstring Stretch 2 reps;30 seconds   Single Knee to Chest Stretch 1 rep;30 seconds   Lower Trunk Rotation 3 reps;30 seconds   Pelvic Tilt 4 reps;30 seconds   Piriformis Stretch 2 reps;30 seconds   Lumbar Exercises: Aerobic   Stationary Bike NuStep L6 x 6 min   Electrical Stimulation   Electrical Stimulation Location Piriformis RT, lateral ankle   Electrical Stimulation Action IFC   Electrical Stimulation  Parameters 10 min, L18, 100% scan   Electrical Stimulation Goals Pain   Manual Therapy   Manual Therapy Joint mobilization   Joint Mobilization grade 2 P>A lumbar spine, hip distration, anterio hip mobs   Myofascial Release DTM rolling therabar and trigger point release in the R pirifomris/ glute med/ max, and fibularis longus/brevis   Kinesiotex Edema                PT Education - 10/19/15 1147    Education provided Yes   Education Details discussed possibly of pain being referred from the back    Person(s) Educated Patient   Methods Explanation   Comprehension Verbalized understanding          PT Short Term Goals - 08/31/15 1657    PT SHORT TERM GOAL #1   Title pt will be I with initial HEP (08/29/2015)   Time 3   Period Weeks   Status Achieved   PT SHORT TERM GOAL #2   Title pt will be  able to verbalize and demonstrate techniques to decrease R hip pain via postural awareness, lifting and carrying mechanics, and HEP (08/29/2015)   Time 3   Period Weeks   Status Achieved           PT Long Term Goals - 10/19/15 1151    PT LONG TERM GOAL #1   Title at discharge pt will be I with all HEP given throughout HEP (09/19/2015)   Baseline independent with exercises given so far   Time 6   Period Weeks   Status On-going   PT LONG TERM GOAL #2   Title pt will be able to stand/ walk for >30 minutes with < 2/10 pain to assist with funcitonal edurance and community ambulation activities (09/19/2015)   Baseline 8/10 pain with walking, uses 2 crutches.  duration not assessed.   Time 6   Period Weeks   Status On-going   PT LONG TERM GOAL #3   Title She will be demonstrate R hip AROM to WNL with <2/10 pain to help with funcitonal progression (09/19/2015)   Baseline ER and EXtension RT WNL, other motions limited.   Time 6   Period Weeks   Status On-going   PT LONG TERM GOAL #4   Title pt will demonstrate increased RLE strength to > 4+/5 with no  pain to assist with prolonged walkng and standing activities associated with ADLs (09/19/2015)   Time 6   Period Weeks   Status On-going   PT LONG TERM GOAL #5   Title She will increase her FOTO score to > 50 to demonstrate improved funcitonal capacity at discharge (09/19/2015)   Baseline FOTO score 62 10/02/2015   Time 6   Period Weeks   Status Achieved               Plan - 10/19/15 1148    Clinical Impression Statement Megan Kemp reports to having conitnuing pain that has increased lately. focused todays treatment on pain reduction via taping and instrument assisted STM over the R lateral hip. during lumbar mobs she reported some referral of pain during L5-L4 P>A mobs which may indicating pain occurring from the low back. talked about dry needling for the tight muscles in the ip and she reported she may try it next visit.    PT Next  Visit Plan Stretching for muscle tightness, and would recommend KT tape for pain control, hip strengthening   PT Home Exercise Plan prone press ups  Consulted and Agree with Plan of Care Patient        Problem List Patient Active Problem List   Diagnosis Date Noted  . Sciatica 07/28/2015  . GERD (gastroesophageal reflux disease) 03/07/2015  . Hx of colonic polyps 03/21/2014  . Hematometra 12/04/2012  . Chronic generalized pain 09/11/2012  . Carpal tunnel syndrome, bilateral 06/10/2012  . Generalized anxiety disorder 05/16/2011  . Dysphagia 04/18/2011  . Migraine 04/08/2011  . SYSTOLIC MURMUR 76/80/8811  . Depression 10/17/2010  . Sickle-cell trait ([place]) 09/28/2009   Starr Lake PT, DPT, LAT, ATC  10/19/2015  12:05 PM      [facility] [address] [place], [place], [address] Phone: [phone]   Fax:  [phone]  Name: Megan Kemp MRN: [id number] Date of Birth: Mar 08, 1961

## 2015-10-24 ENCOUNTER — Ambulatory Visit: Payer: Medicare HMO | Admitting: Physical Therapy

## 2015-10-26 ENCOUNTER — Ambulatory Visit: Payer: Medicare HMO | Admitting: Physical Therapy

## 2015-10-31 ENCOUNTER — Ambulatory Visit: Payer: Medicare HMO | Attending: Family Medicine | Admitting: Physical Therapy

## 2015-10-31 DIAGNOSIS — R269 Unspecified abnormalities of gait and mobility: Secondary | ICD-10-CM | POA: Insufficient documentation

## 2015-10-31 DIAGNOSIS — R293 Abnormal posture: Secondary | ICD-10-CM | POA: Insufficient documentation

## 2015-10-31 DIAGNOSIS — M62838 Other muscle spasm: Secondary | ICD-10-CM | POA: Diagnosis present

## 2015-10-31 DIAGNOSIS — R29898 Other symptoms and signs involving the musculoskeletal system: Secondary | ICD-10-CM | POA: Diagnosis present

## 2015-10-31 DIAGNOSIS — M25551 Pain in right hip: Secondary | ICD-10-CM

## 2015-10-31 NOTE — Therapy (Addendum)
[place], [place], [address] Phone: [phone]   Fax:  [phone]  Physical Therapy Treatment / Re-certification   Patient Details  Name: DEMECIA Kemp MRN: [id number] Date of Birth: 12-24-1960 No Data Recorded  Encounter Date: 10/31/2015      PT End of Session - 10/31/15 1110    Visit Number 16   Number of Visits 18   Date for PT Re-Evaluation 11/14/15   Authorization Type Medicare, KX modifier, progress note by 8th or 9th visit.   PT Start Time 1100   PT Stop Time 1145   PT Time Calculation (min) 45 min   Activity Tolerance Patient tolerated treatment well;Patient limited by pain   Behavior During Therapy [facility] for tasks assessed/performed      Past Medical History  Diagnosis Date  . DUB (dysfunctional uterine bleeding)   . GERD (gastroesophageal reflux disease)   . Migraine with vertigo   . Memory loss   . Vertigo   . Anxiety   . Panic attacks   . Anemia   . Migraine   . CATARACT, RIGHT EYE 10/13/2009  . FIBROIDS, UTERUS 09/28/2009  . Sickle-cell trait 09/28/2009    Significant past medical history only.  Pt has not had any medical complications 2/2 this problem.    . Fibroid uterus 06/18/2012  . AUDITORY HALLUCINATION 01/09/2011    When depression severe in late 2011 and early 2012- had auditory hallucinations stating.  " suicide, suicide"  No hallucinations at this time.    . Chronic pain syndrome   . Carpal tunnel syndrome, bilateral 2012    denies wanting injection or surgery    Past Surgical History  Procedure Laterality Date  . Tubal ligation    . Endometrial ablation  2007  . Cataract extraction  age 59  . Head & neck wound repair / closure  2001    hit in head with pole  . Laparoscopy  11/11/2011    Procedure: LAPAROSCOPY OPERATIVE;  Surgeon: Osborne Oman, MD;  Location: [facility];  Service: Gynecology;  Laterality: N/A;  . Total abdominal hysterectomy w/ bilateral  salpingoophorectomy  02/10/2013    Performed at [place]    There were no vitals filed for this visit.  Visit Diagnosis:  Weakness of right hip - Plan: PT plan of care cert/re-cert  Muscle spasm of right lower extremity - Plan: PT plan of care cert/re-cert  Right hip pain - Plan: PT plan of care cert/re-cert  Abnormality of gait - Plan: PT plan of care cert/re-cert  Abnormal posture - Plan: PT plan of care cert/re-cert      Subjective Assessment - 10/31/15 1108    Subjective " I feel like I am doing better"    Currently in Pain? Yes   Pain Score 8    Pain Location Hip   Pain Orientation Right   Pain Descriptors / Indicators Aching   Pain Type Chronic pain   Pain Onset More than a month ago   Pain Frequency Constant   Aggravating Factors  prlonged sitting/ walking   Pain Relieving Factors tape, stretching, e-stim            [facility] PT Assessment - 10/31/15 0001    Assessment   Medical Diagnosis R Sciatic   Hand Dominance Right   Next MD Visit unknown   Observation/Other Assessments   Focus on Therapeutic Outcomes (FOTO)  51% limited   AROM   Right Hip Extension  15   Right Hip Flexion 90   Right Hip ABduction 15   Lumbar Flexion 45  pain during movement   Lumbar Extension 25  pain during movement   Lumbar - Right Side Bend 40  pain during movement   Lumbar - Left Side Bend 40  pain during movement   Strength   Right Hip Flexion 4/5   Right Hip Extension 4/5   Right Hip ABduction 4/5   Right Hip ADduction 4/5   Left Hip Flexion 4+/5   Left Hip Extension 4+/5   Left Hip ABduction 4+/5   Left Hip ADduction 4+/5                     [facility] Adult PT Treatment/Exercise - 10/31/15 0001    Self-Care   Self-Care Other Self-Care Comments   Other Self-Care Comments  discussed current function and lack of progress or carry over of pain relief.    Lumbar Exercises: Stretches   Active Hamstring Stretch 2 reps;30 seconds   Single Knee to Chest  Stretch 1 rep;30 seconds   Pelvic Tilt 2 reps;30 seconds   Lumbar Exercises: Aerobic   Stationary Bike NuStep L6 x 53mn   Moist Heat Therapy   Number Minutes Moist Heat 15 Minutes   Moist Heat Location Hip;Other (comment)  in L sidelying   Electrical Stimulation   Electrical Stimulation Location Piriformis RT, lateral ankle   Electrical Stimulation Action IFC   Electrical Stimulation Parameters 15 min, L 15, 100% scan   Electrical Stimulation Goals Pain                PT Education - 10/31/15 1133    Education provided Yes   Education Details lack of progress and updated POC   Person(s) Educated Patient   Methods Explanation   Comprehension Verbalized understanding          PT Short Term Goals - 10/31/15 1121    PT SHORT TERM GOAL #1   Title pt will be I with initial HEP (08/29/2015)   Time 3   Period Weeks   Status Achieved   PT SHORT TERM GOAL #2   Title pt will be able to verbalize and demonstrate techniques to decrease R hip pain via postural awareness, lifting and carrying mechanics, and HEP (08/29/2015)   Time 3   Period Weeks   Status Achieved           PT Long Term Goals - 10/31/15 1121    PT LONG TERM GOAL #1   Title at discharge pt will be I with all HEP given throughout HEP (09/19/2015)   Baseline independent with exercises given so far   Time 6   Period Weeks   Status On-going   PT LONG TERM GOAL #2   Title pt will be able to stand/ walk for >30 minutes with < 2/10 pain to assist with funcitonal edurance and community ambulation activities (09/19/2015)   Baseline 8/10 pain with walking, uses 2 crutches.  duration not assessed.   Time 6   Period Weeks   Status On-going   PT LONG TERM GOAL #3   Title She will be demonstrate R hip AROM to WNL with <2/10 pain to help with funcitonal progression (09/19/2015)   Baseline ER and EXtension RT WNL, other motions limited.   Time 6   Period Weeks   Status On-going   PT LONG TERM GOAL #4   Title pt will  demonstrate increased RLE strength  to > 4+/5 with no  pain to assist with prolonged walkng and standing activities associated with ADLs (09/19/2015)   Time 6   Period Weeks   Status On-going   PT LONG TERM GOAL #5   Title She will increase her FOTO score to > 50 to demonstrate improved funcitonal capacity at discharge (09/19/2015)   Baseline FOTO score 62 10/02/2015   Time 6   Period Weeks   Status Achieved               Plan - 2015-11-09 1134    Clinical Impression Statement [employer] continues to demonstrate increased pain today and exhibits increased limitation in the trunk and hip mobility and strength due to pain. pt arrived to todays appointment with a large base quad cane. she has met no new goals this visit. discussed with pt that we will try to see if can get a TENS for her if signed by her physician. pt made an appointment with her phsyicain to following up for alternative treatment.    Pt will benefit from skilled therapeutic intervention in order to improve on the following deficits Abnormal gait;Decreased activity tolerance;Decreased balance;Decreased endurance;Decreased range of motion;Difficulty walking;Impaired tone;Pain;Improper body mechanics;Postural dysfunction;Decreased strength;Decreased knowledge of use of DME;Impaired perceived functional ability;Hypomobility   PT Frequency 1x / week   PT Duration 2 weeks   PT Treatment/Interventions ADLs/Self Care Home Management;Cryotherapy;Electrical Stimulation;Moist Heat;Ultrasound;Functional mobility training;Therapeutic activities;Therapeutic exercise;Balance training;Neuromuscular re-education;Patient/family education;Manual techniques;Passive range of motion;Dry needling;Taping   PT Next Visit Plan Stretching for muscle tightness, and would recommend KT tape for pain control, hip strengthening, provided TENs unit if script signed.    PT Home Exercise Plan HEP review   Consulted and Agree with Plan of Care Patient           G-Codes - Nov 09, 2015 1129    Functional Assessment Tool Used FOTO 51% limited   Functional Limitation Mobility: Walking and moving around   Mobility: Walking and Moving Around Current Status [phone]) At least 40 percent but less than 60 percent impaired, limited or restricted   Mobility: Walking and Moving Around Goal Status [phone]) At least 40 percent but less than 60 percent impaired, limited or restricted      Problem List Patient Active Problem List   Diagnosis Date Noted  . Sciatica 07/28/2015  . GERD (gastroesophageal reflux disease) 03/07/2015  . Hx of colonic polyps 03/21/2014  . Hematometra 12/04/2012  . Chronic generalized pain 09/11/2012  . Carpal tunnel syndrome, bilateral 06/10/2012  . Generalized anxiety disorder 05/16/2011  . Dysphagia 04/18/2011  . Migraine 04/08/2011  . SYSTOLIC MURMUR 93/90/3009  . Depression 10/17/2010  . Sickle-cell trait ([place]) 09/28/2009   Starr Lake PT, DPT, LAT, ATC  11/09/15  11:46 AM      [facility] [address] [place], [place], [address] Phone: [phone]   Fax:  [phone]  Name: Megan Kemp MRN: [id number] Date of Birth: 22-Feb-1961

## 2015-11-02 ENCOUNTER — Encounter: Payer: Medicare HMO | Admitting: Physical Therapy

## 2015-11-07 ENCOUNTER — Ambulatory Visit: Payer: Medicare HMO | Admitting: Physical Therapy

## 2015-11-09 ENCOUNTER — Encounter: Payer: Medicare HMO | Admitting: Physical Therapy

## 2015-11-09 ENCOUNTER — Other Ambulatory Visit: Payer: Self-pay

## 2015-11-09 DIAGNOSIS — Z1231 Encounter for screening mammogram for malignant neoplasm of breast: Secondary | ICD-10-CM

## 2015-11-10 ENCOUNTER — Ambulatory Visit: Payer: Medicare HMO | Admitting: Physical Therapy

## 2015-11-10 ENCOUNTER — Telehealth: Payer: Self-pay | Admitting: Family Medicine

## 2015-11-10 DIAGNOSIS — R29898 Other symptoms and signs involving the musculoskeletal system: Secondary | ICD-10-CM | POA: Diagnosis not present

## 2015-11-10 DIAGNOSIS — M62838 Other muscle spasm: Secondary | ICD-10-CM

## 2015-11-10 DIAGNOSIS — R269 Unspecified abnormalities of gait and mobility: Secondary | ICD-10-CM

## 2015-11-10 DIAGNOSIS — M25551 Pain in right hip: Secondary | ICD-10-CM

## 2015-11-10 DIAGNOSIS — R293 Abnormal posture: Secondary | ICD-10-CM

## 2015-11-10 NOTE — Therapy (Signed)
[place], [place], [address] Phone: [phone]   Fax:  [phone]  Physical Therapy Treatment  Patient Details  Name: Megan Kemp MRN: [id number] Date of Birth: [birth year] No Data Recorded  Encounter Date: 11/10/2015      PT End of Session - 11/10/15 1103    Visit Number 17   Number of Visits 18   Date for PT Re-Evaluation 11/14/15   Authorization Type Medicare, KX modifier, progress note by 8th or 9th visit.   PT Start Time 1100   PT Stop Time 1140   PT Time Calculation (min) 40 min      Past Medical History  Diagnosis Date  . DUB (dysfunctional uterine bleeding)   . GERD (gastroesophageal reflux disease)   . Migraine with vertigo   . Memory loss   . Vertigo   . Anxiety   . Panic attacks   . Anemia   . Migraine   . CATARACT, RIGHT EYE 10/13/2009  . FIBROIDS, UTERUS 09/28/2009  . Sickle-cell trait 09/28/2009    Significant past medical history only.  Pt has not had any medical complications 2/2 this problem.    . Fibroid uterus 06/18/2012  . AUDITORY HALLUCINATION 01/09/2011    When depression severe in late 2011 and early 2012- had auditory hallucinations stating.  " suicide, suicide"  No hallucinations at this time.    . Chronic pain syndrome   . Carpal tunnel syndrome, bilateral 2012    denies wanting injection or surgery    Past Surgical History  Procedure Laterality Date  . Tubal ligation    . Endometrial ablation  2007  . Cataract extraction  age 89  . Head & neck wound repair / closure  2001    hit in head with pole  . Laparoscopy  11/11/2011    Procedure: LAPAROSCOPY OPERATIVE;  Surgeon: Osborne Oman, MD;  Location: [facility];  Service: Gynecology;  Laterality: N/A;  . Total abdominal hysterectomy w/ bilateral salpingoophorectomy  02/10/2013    Performed at [place]    There were no vitals filed for this visit.  Visit Diagnosis:  Weakness of right  hip  Muscle spasm of right lower extremity  Right hip pain  Abnormality of gait  Abnormal posture      Subjective Assessment - 11/10/15 1102    Subjective lateral lower leg is hurting today   Currently in Pain? Yes   Pain Score 6    Pain Location Leg   Pain Descriptors / Indicators Tingling   Aggravating Factors  prolonged sitting and walking   Pain Relieving Factors tape, stretchiing , estim            [facility] PT Assessment - 11/10/15 1108    AROM   Overall AROM  --  right hip IR 20 left 30, knee to chest 110 bilateral   Right Hip Extension 20   Right Hip Flexion 110  knee to chest   Right Hip ABduction 30   Strength   Strength Assessment Site Knee   Right Hip Flexion 4+/5   Right Hip Extension 4+/5   Right Hip ABduction 4/5   Right Hip ADduction 4/5   Left Hip Flexion 4+/5   Left Hip Extension 4/5   Left Hip ABduction 4+/5   Left Hip ADduction 4+/5   Right/Left Knee Right;Left   Right Knee Flexion 4+/5   Right Knee Extension 4+/5   Left Knee Flexion 5/5   Left  Knee Extension 5/5                     [facility] Adult PT Treatment/Exercise - 11/10/15 1115    Lumbar Exercises: Stretches   Active Hamstring Stretch 3 reps;30 seconds   Single Knee to Chest Stretch 3 reps;30 seconds   Piriformis Stretch 3 reps;30 seconds   Lumbar Exercises: Aerobic   Stationary Bike NuStep L6 x 8 min   Lumbar Exercises: Sidelying   Hip Abduction 10 reps   Hip Abduction Weights (lbs) bilateral   Knee/Hip Exercises: Supine   Bridges Limitations 2 x 10    Other Supine Knee/Hip Exercises 3 way hip slr 2 x 10 each                   PT Short Term Goals - 10/31/15 1121    PT SHORT TERM GOAL #1   Title pt will be I with initial HEP (08/29/2015)   Time 3   Period Weeks   Status Achieved   PT SHORT TERM GOAL #2   Title pt will be able to verbalize and demonstrate techniques to decrease R hip pain via postural awareness, lifting and carrying mechanics, and HEP  (08/29/2015)   Time 3   Period Weeks   Status Achieved           PT Long Term Goals - 11/10/15 1104    PT LONG TERM GOAL #1   Title at discharge pt will be I with all HEP given throughout HEP (09/19/2015)   Baseline independent with exercises given so far   Time 6   Period Weeks   Status On-going   PT LONG TERM GOAL #2   Title pt will be able to stand/ walk for >30 minutes with < 2/10 pain to assist with funcitonal edurance and community ambulation activities (09/19/2015)   Baseline can ambulate 30 minutes without crutches however pain increased to 8/10 lateral hip to side of foot   Time 6   Period Weeks   Status Partially Met   PT LONG TERM GOAL #3   Title She will be demonstrate R hip AROM to WNL with <2/10 pain to help with funcitonal progression (09/19/2015)   Baseline IR slighty limited on right , all others WNL   Status Partially Met   PT LONG TERM GOAL #4   Title pt will demonstrate increased RLE strength to > 4+/5 with no  pain to assist with prolonged walkng and standing activities associated with ADLs (09/19/2015)   Time 6   Period Weeks   Status On-going   PT LONG TERM GOAL #5   Title She will increase her FOTO score to > 50 to demonstrate improved funcitonal capacity at discharge (09/19/2015)   Time 6   Period Weeks   Status Achieved               Plan - 11/10/15 1138    Clinical Impression Statement Pt enters clinic today with one crutch which she is carrying at her side. She reports ambulating 30 minutes or more without crutches however pain increases to 8/10. Her hip AROM is nearly symmetrical with only IR limited at on right. Her strength is ~ 4/10 to 4+ /5 bilateral hips. She was re issued 3 way SLR series to continue at home. She was given Tens order and plans to have it signed in person since fax communication with Her MD has not been successful.    PT Next Visit Plan  issue TENS and DC next visit of order signed.         Problem List Patient Active  Problem List   Diagnosis Date Noted  . Sciatica 07/28/2015  . GERD (gastroesophageal reflux disease) 03/07/2015  . Hx of colonic polyps 03/21/2014  . Hematometra 12/04/2012  . Chronic generalized pain 09/11/2012  . Carpal tunnel syndrome, bilateral 06/10/2012  . Generalized anxiety disorder 05/16/2011  . Dysphagia 04/18/2011  . Migraine 04/08/2011  . SYSTOLIC MURMUR 34/19/6222  . Depression 10/17/2010  . Sickle-cell trait ([place]) 09/28/2009    Dorene Ar, PTA 11/10/2015, 11:45 AM  [facility] [address] [place], [place], [address] Phone: [phone]   Fax:  [phone]  Name: Megan Kemp MRN: [id number] Date of Birth: 10-Dec-1961

## 2015-11-10 NOTE — Patient Instructions (Signed)
Hip Extension (Prone)   Lift left leg ___6_ inches from floor, keeping knee locked. Repeat _15___ times per set. Do ___2_ sets per session. Do __2__ sessions per day.  http://orth.exer.us/98    Copyright  VHI. All rights reserved.  Abduction: Side Leg Lift (Eccentric) - Side-Lying   Lie on side. Lift top leg slightly higher than shoulder level. Keep top leg straight with body, toes pointing forward. Slowly lower for 3-5 seconds. _15__ reps per set, __2_ sets per day, __7_ days per week. Add ___ lbs when you achieve ___ repetitions.  Copyright  VHI. All rights reserved.  Strengthening: Straight Leg Raise (Phase 1)   Tighten muscles on front of right thigh, then lift leg _12___ inches from surface, keeping knee locked.  Repeat __15__ times per set. Do __2__ sets per session. Do _2___ sessions per day.  http://orth.exer.us/614   Copyright  VHI. All rights reserved.

## 2015-11-10 NOTE — Telephone Encounter (Signed)
Pt is here to drop off a form to be completed so that she may pursue therapy. Please FAX completed form to [place] on [facility] at [phone]. -AND- call patient to let her know that it has been done. Thank you, Fonda Kinder, ASA

## 2015-11-13 ENCOUNTER — Other Ambulatory Visit: Payer: Self-pay | Admitting: Family Medicine

## 2015-11-13 ENCOUNTER — Ambulatory Visit (INDEPENDENT_AMBULATORY_CARE_PROVIDER_SITE_OTHER): Payer: Medicare HMO | Admitting: Family Medicine

## 2015-11-13 ENCOUNTER — Encounter: Payer: Self-pay | Admitting: Family Medicine

## 2015-11-13 VITALS — BP 132/86 | HR 73 | Temp 98.6°F | Wt 165.0 lb

## 2015-11-13 DIAGNOSIS — Z1159 Encounter for screening for other viral diseases: Secondary | ICD-10-CM

## 2015-11-13 DIAGNOSIS — Z13220 Encounter for screening for lipoid disorders: Secondary | ICD-10-CM

## 2015-11-13 DIAGNOSIS — M5431 Sciatica, right side: Secondary | ICD-10-CM

## 2015-11-13 DIAGNOSIS — E876 Hypokalemia: Secondary | ICD-10-CM | POA: Diagnosis not present

## 2015-11-13 DIAGNOSIS — Z114 Encounter for screening for human immunodeficiency virus [HIV]: Secondary | ICD-10-CM | POA: Diagnosis not present

## 2015-11-13 LAB — LIPID PANEL
CHOL/HDL RATIO: 2.5 ratio (ref <=5.0)
Cholesterol: 141 mg/dL (ref 125–200)
HDL: 57 mg/dL (ref >=46)
LDL Cholesterol: 65 mg/dL (ref <130)
Triglycerides: 96 mg/dL (ref <150)
VLDL: 19 mg/dL (ref <30)

## 2015-11-13 MED ORDER — GABAPENTIN 600 MG PO TABS: 600.0000 mg | ORAL_TABLET | Freq: Three times a day (TID) | ORAL | Status: DC

## 2015-11-13 MED ORDER — TRIAMCINOLONE ACETONIDE 0.1 % EX CREA: 1.0000 "application " | TOPICAL_CREAM | Freq: Two times a day (BID) | CUTANEOUS | Status: DC

## 2015-11-13 NOTE — Progress Notes (Signed)
Subjective:    Megan Kemp - 54 y.o. female MRN IN:[id number]  Date of birth: 01/04/1961  HPI  Megan Kemp is here for right hip pain.   Patient was previously seen at [facility] for right hip pain. She was dx with greater trochanteric bursitis and imaging was no acute findings.  She was referred to [facility] (orthopedic surgery) but she says she cannot go there since she owes them money.    Location: reports that it is occuring in the back of her right buttock and radiating down her right leg.  Pain started: long term  Pain is: sharp  Severity: 10/10  Medications tried: mobic, gabapentin,  Recent trauma: no Similar pain previously: yes   Symptoms Redness: no Swelling:no Fever: no Weakness: no Weight loss: no Rash: no  She has been referred to physical therapy and thinks it has helped some.  Symptoms improved with aleve   Health Maintenance:  Health Maintenance Due  Topic Date Due  . PAP SMEAR  06/16/2015    -  reports that she has never smoked. She has never used smokeless tobacco. - Review of Systems: Per HPI. All other systems reviewed and are negative. - Past Medical History: Patient Active Problem List   Diagnosis Date Noted  . Sciatica 07/28/2015  . GERD (gastroesophageal reflux disease) 03/07/2015  . Hx of colonic polyps 03/21/2014  . Hematometra 12/04/2012  . Chronic generalized pain 09/11/2012  . Carpal tunnel syndrome, bilateral 06/10/2012  . Generalized anxiety disorder 05/16/2011  . Dysphagia 04/18/2011  . Migraine 04/08/2011  . SYSTOLIC MURMUR [id number]  . Depression 10/17/2010  . Sickle-cell trait ([place]) 09/28/2009   - Medications: reviewed and updated Current Outpatient Prescriptions  Medication Sig Dispense Refill  . FLUoxetine (PROZAC) 20 MG capsule Take 2 capsules (40 mg total) by mouth 2 (two) times daily. 120 capsule 5  . Multiple Vitamin (MULTIVITAMIN) tablet Take 1 tablet by mouth daily.      Marland Kitchen NAPROXEN PO Take by mouth.    .  pantoprazole (PROTONIX) 40 MG tablet TAKE 1 TABLET (40 MG TOTAL) BY MOUTH DAILY. 30 tablet 1  . triamcinolone cream (KENALOG) 0.1 % Apply 1 application topically 2 (two) times daily. 30 g 0  . VOLTAREN 1 % GEL APPLY TOPICALLY 4 TIMES A DAY AS NEEDED 1 Tube 3  . divalproex (DEPAKOTE ER) 500 MG 24 hr tablet Take 2 tablets (1,000 mg total) by mouth daily. (Patient not taking: Reported on 11/13/2015) 60 tablet 11  . gabapentin (NEURONTIN) 600 MG tablet Take 1 tablet (600 mg total) by mouth 3 (three) times daily. 84 tablet 1  . predniSONE (DELTASONE) 20 MG tablet Take 3 tabs po on first day, 2 tabs second day, 2 tabs third day, 1 tab fourth day, 1 tab 5th day. Take with food. (Patient not taking: Reported on 11/13/2015) 9 tablet 0  . [DISCONTINUED] cetirizine (ZYRTEC) 10 MG tablet Take 1 tablet (10 mg total) by mouth daily. (Patient not taking: Reported on 04/25/2015) 90 tablet 1  . [DISCONTINUED] nitroGLYCERIN (NITRODUR - DOSED IN MG/24 HR) 0.2 mg/hr CUT PATCH INTO QUARTERS AND PLACE 1 PATCH ON THE LEFT HEEL EACH DAY AFTER REMOVING OLD PATCH (Patient not taking: Reported on 04/25/2015) 30 patch 0   No current facility-administered medications for this visit.     Review of Systems See HPI     Objective:   Physical Exam BP 132/86 mmHg  Pulse 73  Temp(Src) 98.6 F (37 C) (Oral)  Wt 165  lb (74.844 kg)  LMP 05/06/2011 Gen: NAD, alert, cooperative with exam,  MSK: ambulating with crutches  Back:  Appearance: symmetric, no ecchymosis  Palpation: TTP along greater trochanter on right, glute medius and maximus, IT band  Hip:  Appearance: symmetric  Palpation: tenderness of greater trochanter Rotation Reduced: no reduced internal or external rotation Neuro: Strength  knee extension 5/5, knee flexion 5/5, dorsiflexion 5/5, plantar flexion 5/5 Reflexes: patella 2/2 Bilateral  Achilles 2/2 Bilateral Straight Leg Raise: negative Sensation to light touch intact: yes Neurovascularly intact        Assessment & Plan:    Sciatica Still reporting symptoms sugguestive of sciatica vs greater trochanteric bursitis. Exam reassuring with normal musculature and sensation Having normal gait but occasionally use of crutches from time to time No saddle anesthesia or bowel or bladder incontinence - Increase gabapentin - I will order the TENS unit that she is a requesting - If symptoms persist for over an extended period of time may need to consider an MRI

## 2015-11-13 NOTE — Patient Instructions (Signed)
Thank you for coming in,   I have increased your gabapentin.   I will sign the TENS unit and place it to be faxed.   Please bring all of your medications with you to each visit.   Sign up for My Chart to have easy access to your labs results, and communication with your Primary care physician   Please feel free to call with any questions or concerns at any time, at [phone]. --Dr. Raeford Razor

## 2015-11-14 LAB — BASIC METABOLIC PANEL WITH GFR
BUN: 16 mg/dL (ref 7–25)
CALCIUM: 9.2 mg/dL (ref 8.6–10.4)
CO2: 26 mmol/L (ref 20–31)
Chloride: 102 mmol/L (ref 98–110)
Creat: 0.85 mg/dL (ref 0.50–1.05)
GFR, EST NON AFRICAN AMERICAN: 78 mL/min (ref >=60)
Glucose, Bld: 69 mg/dL (ref 65–99)
Potassium: 3.9 mmol/L (ref 3.5–5.3)
Sodium: 140 mmol/L (ref 135–146)

## 2015-11-14 LAB — HIV ANTIBODY (ROUTINE TESTING W REFLEX): HIV 1&2 Ab, 4th Generation: NONREACTIVE

## 2015-11-14 LAB — HEPATITIS C ANTIBODY: HCV Ab: NEGATIVE

## 2015-11-14 NOTE — Assessment & Plan Note (Addendum)
Still reporting symptoms sugguestive of sciatica vs greater trochanteric bursitis. Exam reassuring with normal musculature and sensation Having normal gait but occasionally use of crutches from time to time No saddle anesthesia or bowel or bladder incontinence - Increase gabapentin - I will order the TENS unit that she is a requesting - If symptoms persist for over an extended period of time may need to consider an MRI

## 2015-11-15 NOTE — Telephone Encounter (Signed)
Form placed in PCP box. Zimmerman Rumple, Kathyrn Warmuth D, CMA  

## 2015-11-20 ENCOUNTER — Encounter: Payer: Self-pay | Admitting: Family Medicine

## 2015-11-22 NOTE — Telephone Encounter (Signed)
Form completed and placed to be faxed.   Rosemarie Ax, MD PGY-3, [facility] 11/22/2015, 8:09 AM

## 2015-11-23 ENCOUNTER — Ambulatory Visit: Payer: Medicare HMO | Attending: Family Medicine | Admitting: Physical Therapy

## 2015-11-23 DIAGNOSIS — M62838 Other muscle spasm: Secondary | ICD-10-CM | POA: Diagnosis present

## 2015-11-23 DIAGNOSIS — R29898 Other symptoms and signs involving the musculoskeletal system: Secondary | ICD-10-CM | POA: Insufficient documentation

## 2015-11-23 DIAGNOSIS — M25551 Pain in right hip: Secondary | ICD-10-CM | POA: Diagnosis present

## 2015-11-23 DIAGNOSIS — R269 Unspecified abnormalities of gait and mobility: Secondary | ICD-10-CM

## 2015-11-23 DIAGNOSIS — R293 Abnormal posture: Secondary | ICD-10-CM | POA: Diagnosis present
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
## 2015-12-19 ENCOUNTER — Ambulatory Visit: Payer: Medicare HMO

## 2016-01-02 ENCOUNTER — Ambulatory Visit: Payer: Medicare HMO

## 2016-01-04 ENCOUNTER — Other Ambulatory Visit: Payer: Self-pay | Admitting: Family Medicine

## 2016-01-04 MED ORDER — TRIAMCINOLONE ACETONIDE 0.1 % EX CREA: 1.0000 "application " | TOPICAL_CREAM | Freq: Two times a day (BID) | CUTANEOUS | Status: DC

## 2016-01-04 NOTE — Telephone Encounter (Signed)
Pt was taking antibotic cream and its not working. Would like something else. " the old one is in the computer"  triamcinol  Would like to have a refill on this  [facility] [address]

## 2016-01-10 ENCOUNTER — Ambulatory Visit
Admission: RE | Admit: 2016-01-10 | Discharge: 2016-01-10 | Disposition: A | Discharge status: Home / Self Care | Payer: Medicare Other | Source: Ambulatory Visit

## 2016-01-10 DIAGNOSIS — Z1231 Encounter for screening mammogram for malignant neoplasm of breast: Secondary | ICD-10-CM

## 2016-01-29 ENCOUNTER — Other Ambulatory Visit: Payer: Self-pay | Admitting: *Deleted

## 2016-01-29 MED ORDER — GABAPENTIN 600 MG PO TABS: 600.0000 mg | ORAL_TABLET | Freq: Three times a day (TID) | ORAL | Status: DC

## 2016-02-08 ENCOUNTER — Ambulatory Visit (INDEPENDENT_AMBULATORY_CARE_PROVIDER_SITE_OTHER): Payer: Medicare Other | Admitting: Family Medicine

## 2016-02-08 ENCOUNTER — Encounter: Payer: Self-pay | Admitting: Family Medicine

## 2016-02-08 VITALS — BP 140/105 | HR 70 | Temp 97.9°F | Wt 173.7 lb

## 2016-02-08 DIAGNOSIS — F4321 Adjustment disorder with depressed mood: Secondary | ICD-10-CM | POA: Insufficient documentation

## 2016-02-08 DIAGNOSIS — R21 Rash and other nonspecific skin eruption: Secondary | ICD-10-CM | POA: Diagnosis not present

## 2016-02-08 DIAGNOSIS — G4700 Insomnia, unspecified: Secondary | ICD-10-CM | POA: Diagnosis not present

## 2016-02-08 MED ORDER — TRIAMCINOLONE ACETONIDE 0.1 % EX CREA: 1.0000 "application " | TOPICAL_CREAM | Freq: Two times a day (BID) | CUTANEOUS | Status: DC

## 2016-02-08 NOTE — Assessment & Plan Note (Signed)
Seems appropriate for the recent losses in her life.  Counseled her today  - advised that she should follow up with me as much as she like - she is willing to try integrative care if she follows up.

## 2016-02-08 NOTE — Patient Instructions (Signed)
Thank you for coming in,   Please try the sleep hygiene as described below. Try this for a week or two.   If the sleep hygiene doesn't work then you can try Melatonin 5 mg to be taken 30 minutes before bed.    Please bring all of your medications with you to each visit.   Sign up for My Chart to have easy access to your labs results, and communication with your Primary care physician   Please feel free to call with any questions or concerns at any time, at [phone]. --Dr. Raeford Razor   How can I improve my sleep hygiene?  One of the most important sleep hygiene practices is to spend an appropriate amount of time asleep in bed, not too little or too excessive. Sleep needs vary across ages and are especially impacted by lifestyle and health. However, there are recommendations that can provide guidance on how much sleep you need generally. Other good sleep hygiene practices include:  Limiting daytime naps to 30 minutes . Napping does not make up for inadequate nighttime sleep. However, a short nap of 20-30 minutes can help to improve mood, alertness and performance.   Avoiding stimulants such as  caffeine and nicotine close to bedtime.  And when it comes to alcohol, moderation is key 4.   While alcohol is well-known to help you fall asleep faster, too much close to bedtime can disrupt sleep in the second half of the night as the body begins to process the alcohol.     Exercising to promote good quality sleep.  As little as 10 minutes of aerobic exercise, such as walking or cycling, can drastically improve nighttime sleep quality.  For the best night's sleep, most people should avoid strenuous workouts close to bedtime. However, the effect of intense nighttime exercise on sleep differs from person to person, so find out what works best for you.    Steering clear of food that can be disruptive right before sleep.   Heavy or rich foods, fatty or fried meals, spicy dishes, citrus fruits, and  carbonated drinks can trigger indigestion for some people. When this occurs close to bedtime, it can lead to painful heartburn that disrupts sleep.  Ensuring adequate exposure to natural light.  This is particularly important for individuals who may not venture outside frequently. Exposure tosunlight during the day, as well as darkness at night, helps to maintain a healthy sleep-wake cycle .  Establishing a regular relaxing bedtime routine.  A regular nightly routine helps the body recognize that it is bedtime. This could include taking warm shower or bath, reading a book, or light stretches. When possible, try to avoid emotionally upsetting conversations and activities before attempting to sleep.  Making sure that the sleep environment is pleasant.  Mattress and pillows should be comfortable. The bedroom should be cool - between 60 and 67 degrees - for optimal sleep. Bright light from lamps, cell phone and TV screens can make it difficult to fall asleep4, so turn those light off or adjust them when possible. Consider using blackout curtains, eye shades, ear plugs, "white noise" machines, humidifiers, fans and other devices that can make the bedroom more relaxing.
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
## 2016-02-09 DIAGNOSIS — G4700 Insomnia, unspecified: Secondary | ICD-10-CM | POA: Insufficient documentation

## 2016-02-09 NOTE — Assessment & Plan Note (Signed)
Appears to be associated with her picking herself secondary to stress  Looks like lichen chronic simplex  She will wake up with itching and picking at it.  Will need to break the cycle and get her to sleep better  - triamcinolone sent in  - may need to try a stronger steroid if no improvement but I have concern about her ability to follow instructions with stronger steroid ointments.

## 2016-02-09 NOTE — Assessment & Plan Note (Signed)
She has a poor sleep schedule and hygeine  Her poor sleep contributes to her generalized pain d/o as well as picking of her hand and arm.  - instructed to implement a sleep hygiene - If unsuccessful with the sleep hygiene advice to obtain melatonin and take 30 minutes prior to bed. - If unsuccessful with that then may need to try trazodone. - Advised that she should follow-up with me in 2-4 weeks

## 2016-08-02 ENCOUNTER — Ambulatory Visit: Payer: Medicare Other | Admitting: Family Medicine

## 2016-08-30 ENCOUNTER — Ambulatory Visit: Payer: Medicare Other | Admitting: Family Medicine

## 2016-09-24 LAB — GLUCOSE, POCT (MANUAL RESULT ENTRY): POC GLUCOSE: 86 mg/dL (ref 70–99)

## 2016-09-26 ENCOUNTER — Emergency Department (HOSPITAL_COMMUNITY)
Admission: EM | Admit: 2016-09-26 | Discharge: 2016-09-26 | Disposition: A | Discharge status: Home / Self Care | Payer: Medicare Other | Attending: Emergency Medicine | Admitting: Emergency Medicine

## 2016-09-26 ENCOUNTER — Encounter (HOSPITAL_COMMUNITY): Payer: Self-pay

## 2016-09-26 ENCOUNTER — Emergency Department (HOSPITAL_COMMUNITY): Payer: Medicare Other

## 2016-09-26 DIAGNOSIS — R42 Dizziness and giddiness: Secondary | ICD-10-CM | POA: Insufficient documentation

## 2016-09-26 DIAGNOSIS — Z79899 Other long term (current) drug therapy: Secondary | ICD-10-CM | POA: Diagnosis not present

## 2016-09-26 DIAGNOSIS — R519 Headache, unspecified: Secondary | ICD-10-CM

## 2016-09-26 DIAGNOSIS — I10 Essential (primary) hypertension: Secondary | ICD-10-CM | POA: Insufficient documentation

## 2016-09-26 DIAGNOSIS — R51 Headache: Secondary | ICD-10-CM | POA: Diagnosis not present

## 2016-09-26 DIAGNOSIS — Z91040 Latex allergy status: Secondary | ICD-10-CM | POA: Diagnosis not present

## 2016-09-26 LAB — BASIC METABOLIC PANEL
Anion gap: 5 (ref 5–15)
BUN: 15 mg/dL (ref 6–20)
CO2: 29 mmol/L (ref 22–32)
Calcium: 9.4 mg/dL (ref 8.9–10.3)
Chloride: 103 mmol/L (ref 101–111)
Creatinine, Ser: 0.89 mg/dL (ref 0.44–1.00)
GFR calc Af Amer: >60 mL/min (ref >60)
GFR calc non Af Amer: >60 mL/min (ref >60)
GLUCOSE: 87 mg/dL (ref 65–99)
POTASSIUM: 3.7 mmol/L (ref 3.5–5.1)
Sodium: 137 mmol/L (ref 135–145)

## 2016-09-26 LAB — CBC
HCT: 38.9 % (ref 36.0–46.0)
Hemoglobin: 12.5 g/dL (ref 12.0–15.0)
MCH: 29.3 pg (ref 26.0–34.0)
MCHC: 32.1 g/dL (ref 30.0–36.0)
MCV: 91.1 fL (ref 78.0–100.0)
Platelets: 389 K/uL (ref 150–400)
RBC: 4.27 MIL/uL (ref 3.87–5.11)
RDW: 14.2 % (ref 11.5–15.5)
WBC: 5.6 K/uL (ref 4.0–10.5)

## 2016-09-26 LAB — I-STAT TROPONIN, ED: TROPONIN I, POC: 0 ng/mL (ref 0.00–0.08)

## 2016-09-26 MED ORDER — METOCLOPRAMIDE HCL 5 MG/ML IJ SOLN
10.0000 mg | Freq: Once | INTRAMUSCULAR | Status: AC
  Administered 2016-09-26: 10 mg via INTRAVENOUS
  Filled 2016-09-26: qty 2

## 2016-09-26 MED ORDER — LORAZEPAM 2 MG/ML IJ SOLN
1.0000 mg | Freq: Once | INTRAMUSCULAR | Status: AC
  Administered 2016-09-26: 1 mg via INTRAVENOUS
  Filled 2016-09-26: qty 1

## 2016-09-26 MED ORDER — DIPHENHYDRAMINE HCL 50 MG/ML IJ SOLN
25.0000 mg | Freq: Once | INTRAMUSCULAR | Status: AC
  Administered 2016-09-26: 25 mg via INTRAVENOUS
  Filled 2016-09-26: qty 1

## 2016-09-26 MED ORDER — LISINOPRIL 5 MG PO TABS: 5.0000 mg | ORAL_TABLET | Freq: Every day | ORAL | 0 refills | Status: DC

## 2016-09-26 MED ORDER — SODIUM CHLORIDE 0.9 % IV BOLUS (SEPSIS)
500.0000 mL | Freq: Once | INTRAVENOUS | Status: AC
  Administered 2016-09-26: 500 mL via INTRAVENOUS

## 2016-09-26 MED ORDER — LISINOPRIL 10 MG PO TABS
10.0000 mg | ORAL_TABLET | Freq: Once | ORAL | Status: AC
  Administered 2016-09-26: 10 mg via ORAL
  Filled 2016-09-26: qty 1

## 2016-09-26 NOTE — ED Notes (Signed)
EKG reviewed and signed by

## 2016-09-26 NOTE — ED Notes (Signed)
Patient is in good condition, is ambulatory but wanted a wheelchair to get to the ED lobby, pt states she will drive herself home; discharge instructions reviewed, prescription medications reviewed, follow up care reviewed, patient verbalized understanding

## 2016-09-26 NOTE — Discharge Instructions (Signed)
Take tylenol, motrin for headaches.   See your primary care doctor next week to recheck blood pressure.   Take lisinopril as prescribed.   Return to ER if you have worse headaches, vomiting, worse chest pain, dizziness.

## 2016-09-26 NOTE — ED Triage Notes (Addendum)
Pt presents to ED with reports of high blood pressure, dizziness, and weakness. Pt states she has no hx of HTN but when she had her BP [place] on [place] a local church "the lady told me it was high". Pt reports her BP was high upon checking today and her PCP told her to come in to the Ed. Pt denies CP and N/V. Current BP is 143/96
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
## 2016-10-04 ENCOUNTER — Encounter: Payer: Self-pay | Admitting: Family Medicine

## 2016-10-04 ENCOUNTER — Encounter: Payer: Self-pay | Admitting: Psychology

## 2016-10-04 ENCOUNTER — Ambulatory Visit (INDEPENDENT_AMBULATORY_CARE_PROVIDER_SITE_OTHER): Payer: Medicare Other | Admitting: Family Medicine

## 2016-10-04 VITALS — BP 130/85 | HR 74 | Temp 98.0°F | Ht 68.0 in | Wt 159.4 lb

## 2016-10-04 DIAGNOSIS — F329 Major depressive disorder, single episode, unspecified: Secondary | ICD-10-CM

## 2016-10-04 DIAGNOSIS — L989 Disorder of the skin and subcutaneous tissue, unspecified: Secondary | ICD-10-CM

## 2016-10-04 DIAGNOSIS — F32A Depression, unspecified: Secondary | ICD-10-CM

## 2016-10-04 DIAGNOSIS — F411 Generalized anxiety disorder: Secondary | ICD-10-CM | POA: Diagnosis not present

## 2016-10-04 DIAGNOSIS — I10 Essential (primary) hypertension: Secondary | ICD-10-CM

## 2016-10-04 DIAGNOSIS — L281 Prurigo nodularis: Secondary | ICD-10-CM | POA: Insufficient documentation

## 2016-10-04 LAB — BASIC METABOLIC PANEL WITH GFR
BUN: 13 mg/dL (ref 7–25)
CHLORIDE: 103 mmol/L (ref 98–110)
CO2: 29 mmol/L (ref 20–31)
CREATININE: 0.86 mg/dL (ref 0.50–1.05)
Calcium: 9.7 mg/dL (ref 8.6–10.4)
GFR, Est African American: 89 mL/min (ref >=60)
GFR, Est Non African American: 77 mL/min (ref >=60)
GLUCOSE: 81 mg/dL (ref 65–99)
Potassium: 4.4 mmol/L (ref 3.5–5.3)
Sodium: 139 mmol/L (ref 135–146)

## 2016-10-04 MED ORDER — TRIAMCINOLONE ACETONIDE 0.1 % EX CREA: 1.0000 "application " | TOPICAL_CREAM | Freq: Two times a day (BID) | CUTANEOUS | 0 refills | Status: DC

## 2016-10-04 MED ORDER — ACETAMINOPHEN 500 MG PO TABS
1000.0000 mg | ORAL_TABLET | Freq: Once | ORAL | Status: AC
  Administered 2016-10-04: 1000 mg via ORAL

## 2016-10-04 MED ORDER — LISINOPRIL 5 MG PO TABS: 5.0000 mg | ORAL_TABLET | Freq: Every day | ORAL | 2 refills | Status: DC

## 2016-10-04 NOTE — Patient Instructions (Signed)
It was a pleasure to meet you today! I will call you if the results of your labs are abnormal. If the site on your hand starts bleeding again or gets red or you get fevers, come back to the clinic. Otherwise, I want to see you back in 3 months.
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
## 2016-10-04 NOTE — Assessment & Plan Note (Signed)
PHQ 9 = 11 today, 8 1 year ago. No current SI or HI. Tearful when discussing family issues. Warm handoff to behavioral health done, will continue current meds for now.

## 2016-10-04 NOTE — Progress Notes (Signed)
Dr. Lindell Noe requested a [place].   Presenting Issue:  Patient tearful about relationship with son  Report of symptoms:  Patient feels down and angry with others. She has frequent suicidal thoughts and auditory hallucinations encouraging her to kill herself.  Duration of CURRENT symptoms:  Patient notes that she started experiencing these symptoms in 2011 when she was hospitalized for vertigo and/or a stomach surgery, and was put on a medication that caused psychotic symptoms. She was taken off of it and likes her current configuration of medication.  Age of onset of first mood disturbance:  Not assessed  Impact on function:  Patient is isolated and has frequent verbal altercations, occasionally physical, with others when she becomes angry. Many family members no longer want to associate with her.  Psychiatric History - Diagnoses: Past diagnoses include PTSD, Panic disorder, agoraphobia, and depression - Hospitalizations: No psychiatric hospitalizations - Pharmacotherapy: Currently on prozac, amyitriptyline, and gabapentin - Outpatient therapy: [facility] a few years [facility] which closed down Family history of psychiatric issues:  Sister with bipolar disorder  Current and history of substance use:  None  Medical conditions that might explain or contribute to symptoms:    PHQ-9:  Incomplete GAD-7:   MDQ (if indicated):  *Need to give next appointment  Assessment / Plan / Recommendations: Patient doesn't understand why family members and strangers treat her badly and are disrespectful. She became tearful discussing her son's harsh words. She lives alone and is on disability. She has had a difficult life recently with the passing away of grandmother, mother, and other family over the last several years. She finds strength in god, but doesn't always get along with people at church. Sometimes this results in verbal altercations, that occasionally become  physical if patient becomes very angry. She has threatened others before if she feels the need in order to protect herself but doesn't act on it. She has had auditory hallucinations telling her to kill herself for a few years but says she never has hurt herself and would never act on it given her love of her grandson and religion. She also hears god's voice telling her to keep going, and also occasionally, telling her that she is being taken advantage of by her family. She occasionally sees shadows but this doesn't bother her. Patient will return to meet [facility] Monday the 23rd at [place]. She is excited to have someone to talk to. [facility] consulted with PCP, Dr. Gwendlyn Deutscher, and Dr. Gwenlyn Saran about patient's suicidality.

## 2016-10-04 NOTE — Assessment & Plan Note (Signed)
GAD 7 = 14 today (13 04/2015), patient tearful on discussion of family arguments and lack of relationship with her sisters. Warm handoff to behavioral health, appreciate expertise. No med changes.

## 2016-10-04 NOTE — Progress Notes (Signed)
CC: Blood pressure, sore on hand  HPI: Megan Kemp presents to meet me as her new PCP, she is in good spirits but becomes quickly tearful when discussing her relationship with her only child, an adult son. He reports that they frequently get into verbal arguments and he tries to keep her grandchildren from her. Of note, she lives alone. She reports that she is disabled, but had a better relationship with her family when she worked and paid most of their bills. He reports that she has had multiple episodes of elevated blood pressures as an outpatient, at church, as well as at the pharmacy. Most recently, [facility] and systolic was over [id number], the pharmacist recommended that she immediately go to the ED. See ED notes for October 5, ED M.D. Started her on 5 mg lisinopril. She has been taking this religiously, and blood pressure is good today. She also reports that she is meditating and staying away from people that "get on her nerves."  Regarding the sore on her hand, she thinks this is been there for approximately a year Can't clearly recall the details of the inciting injury, although she says she hit it on the back of a pickup truck. Reports that it itches and burns, she thinks that something is inside of it, frequently picks at it daily and nightly. Denies fevers, endorses increasing size of this lesion over the past year. See picture in exam.  PHQ9 = 11 and gad 7 =14 filled out today, priors were 8 and 13 in 04/2015, will do warm handoff to integrated behavioral health.  CC, SH/smoking status, and VS noted  Objective: BP 130/85   Pulse 74   Temp 98 F (36.7 C) (Oral)   Ht 5\' 8"  (1.727 m)   Wt 159 lb 6.4 oz (72.3 kg)   LMP 05/06/2011   BMI 24.24 kg/m  Gen: NAD, alert, cooperative, and pleasant. CV: RRR, no murmur Resp: CTAB, no wheezes, non-labored Skin: scaly plaque with hyperpigmented borders, tender to palpation. Punch biopsy taken as below.       Assessment and plan:  HTN (hypertension) New diagnosis, no alarm symptoms. Recently started on lisinopril 5 mg per day by the ED after multiple repeat outpatient blood pressures greater than Q000111Q systolic. BP 130/85 today. Recommended that she keep outpatient log of BPs. Refilled lisinopril, drew BMP today to check Cr.   Generalized anxiety disorder GAD 7 = 14 today (13 04/2015), patient tearful on discussion of family arguments and lack of relationship with her sisters. Warm handoff to behavioral health, appreciate expertise. No med changes.  Depression PHQ 9 = 11 today, 8 1 year ago. No current SI or HI. Tearful when discussing family issues. Warm handoff to behavioral health done, will continue current meds for now.  Skin lesion of hand Present for >1 year. Pharynx this began with an injury by hitting her hand on a truck bed.Unable to specify any further. No recent fevers,denies erythema or warmth. On exam, scaly plaque of approximately 2 cm, tender to palpation. As patient reports increase in size recently, will biopsy today.  Consent obtained, punch biopsy with 4 mm punchperformed, patient had significant postprocedural bleeding, and this resolved with 3 treatments of silver nitrate and pressure for 5 minutes. Place pressure dressing, given return precautions regarding bleeding or signs of infection.Will send for dermatopathology.   Orders Placed This Encounter  Procedures  . BASIC METABOLIC PANEL WITH GFR    Meds ordered this  encounter  Medications  . lisinopril (PRINIVIL,ZESTRIL) 5 MG tablet    Sig: Take 1 tablet (5 mg total) by mouth daily.    Dispense:  30 tablet    Refill:  2  . triamcinolone cream (KENALOG) 0.1 %    Sig: Apply 1 application topically 2 (two) times daily.    Dispense:  30 g    Refill:  0     Ralene Ok, MD, PGY1 10/04/2016 3:42 PM

## 2016-10-04 NOTE — Addendum Note (Signed)
Addended by: Katharina Caper, Eldana Isip D on: 10/04/2016 04:55 PM   Modules accepted: Orders

## 2016-10-04 NOTE — Assessment & Plan Note (Addendum)
New diagnosis, no alarm symptoms. Recently started on lisinopril 5 mg per day by the ED after multiple repeat outpatient blood pressures greater than Q000111Q systolic. BP 130/85 today. Recommended that she keep outpatient log of BPs. Refilled lisinopril, drew BMP today to check Cr.

## 2016-10-07 ENCOUNTER — Encounter: Payer: Self-pay | Admitting: Family Medicine

## 2016-10-14 ENCOUNTER — Ambulatory Visit (INDEPENDENT_AMBULATORY_CARE_PROVIDER_SITE_OTHER): Payer: Medicare Other | Admitting: Psychology

## 2016-10-14 DIAGNOSIS — F329 Major depressive disorder, single episode, unspecified: Secondary | ICD-10-CM

## 2016-10-14 DIAGNOSIS — F32A Depression, unspecified: Secondary | ICD-10-CM

## 2016-10-14 NOTE — Progress Notes (Signed)
Dr. Lindell Noe requested a [place].     Presenting Issue:  Patient tearful about relationship with son   Report of symptoms:  Patient feels down and angry with others. She has frequent suicidal thoughts and auditory hallucinations encouraging her to kill herself.   Duration of CURRENT symptoms:  Patient notes that she started experiencing these symptoms in 2011 when she was hospitalized for vertigo and/or a stomach surgery, and was put on a medication that caused psychotic symptoms. She was taken off of it and likes her current configuration of medication.   Age of onset of first mood disturbance:  Not assessed   Impact on function:  Patient is isolated and has frequent verbal altercations, occasionally physical, with others when she becomes angry. Many family members no longer want to associate with her.   Psychiatric History - Diagnoses: Past diagnoses include PTSD, Panic disorder, agoraphobia, and depression - Hospitalizations:  No psychiatric hospitalizations - Pharmacotherapy: Currently on prozac, amyitriptyline, and gabapentin - Outpatient therapy: [facility] a few years ago which closed down Family history of psychiatric issues:  Sister with bipolar disorder   Current and history of substance use:  None   Medical conditions that might explain or contribute to symptoms:     PHQ-9:  Incomplete GAD-7:   MDQ (if indicated):  *Need to give next appointment   Assessment / Plan / Recommendations:  Patient doesn't understand why family members and strangers treat her badly and are disrespectful. She became tearful discussing her son's harsh words. She lives alone and is on disability. She has had a difficult life recently with the passing away of grandmother, mother, and other family over the last several years. She finds strength in god, but doesn't always get along with people at church. Sometimes this results in verbal altercations, that occasionally  become physical if patient becomes very angry. She has threatened others before if she feels the need in order to protect herself but doesn't act on it. She has had auditory hallucinations telling her to kill herself for a few years but says she never has hurt herself and would never act on it given her love of her grandson and religion. She also hears god's voice telling her to keep going, and also occasionally, telling her that she is being taken advantage of by her family. She occasionally sees shadows but this doesn't bother her. Patient will return to see [facility] Monday the 23rd at [place]. She is excited to have someone to talk to. [facility] consulted with PCP, Dr. Gwendlyn Deutscher, and Dr. Gwenlyn Saran about patient's suicidality.

## 2016-10-14 NOTE — Progress Notes (Signed)
Reason for follow-up:  Patient returning to discuss difficulties with anger and feeling isolated from family  Issues discussed:  We discussed different ways to manage anger and patients goal of "being peaceful" as well as possibility of medication changes.  Identified goals:  Patient will spend more time with her sister who is visiting and will remember to set a good example to her grandkids by not beginning or continuing arguments with others. She will also speak with her son again for the first time after their fight given his apology, and return in 2 weeks to see [facility].

## 2016-10-14 NOTE — Assessment & Plan Note (Signed)
Assessment/Plan/Recommendations: Patient reports feeling a little better in the past week. PHQ - 9 of 7. She spent time with her sister and old friends at a [employer] event which she was pleasantly surprised that she enjoyed. Generally, she acknowledged that she tries to isolate herself so she can be peaceful, but also is happier when she spends time with loved ones. She noted a friend Ms. Owens Shark, that can talk her down when she feels aggressive, but feels that being protective and defensive is what she knows to do. She thinks of herself as having a good side and a "fighter" side leading her to feel or act aggressively but also wants to stay out of trouble. Patient spoke about other topics when [facility] broached anger management strategies, but at the end of the session, said she will stay out of trouble. [facility] asked if she would be open to discussing medication, and patient politely stated she is not interested. Patient has a date with a former classmate and will see her sister in the next week. She will also speak to her son again as he spent $1000 repairing her well as an apologetic gesture. She will return in 2 weeks to see [facility].

## 2016-10-28 ENCOUNTER — Telehealth: Payer: Self-pay | Admitting: Family Medicine

## 2016-10-28 ENCOUNTER — Ambulatory Visit (INDEPENDENT_AMBULATORY_CARE_PROVIDER_SITE_OTHER): Payer: Medicare Other | Admitting: Psychology

## 2016-10-28 DIAGNOSIS — F32A Depression, unspecified: Secondary | ICD-10-CM

## 2016-10-28 DIAGNOSIS — F329 Major depressive disorder, single episode, unspecified: Secondary | ICD-10-CM

## 2016-10-28 NOTE — Telephone Encounter (Signed)
Per Dr. Erin Hearing, we don't add these patients on to our schedules. If there is something urgent she wants to discuss, she can be added as a same day, but otherwise I will follow up with the integrated care providers if there is anything they need to share with me.

## 2016-10-28 NOTE — Telephone Encounter (Signed)
Provider spoke with [facility] and he is aware of this.  The plan is to check with provider once the patient is done being seen. Jazmin Hartsell,CMA

## 2016-10-28 NOTE — Assessment & Plan Note (Signed)
Assessment/Plan/Recommendations: Patient was tired and irritable due to issues with her new car's alarm going off but was able to relax. Tearful when discussing how tired of her family she is and in remembering her deceased mother. She occasionally went off on tangents such as describing her brother's injury in detail. Over last two weeks, she enjoyed time with her best friend but her anger and frustration about her family is the same. She says that going out of house is difficult because people (family, homeless people) always ask her for money. She feels this is unique to [place]. She is determined to move to [place] or [place] in the next 1-2 years because she has family members in those places that she enjoys spending time with. [facility] asked patient pros and cons of this move. Patient couldn't identify cons but needs time to sell her house to have money to move and settle. Patient will [place] to help her relax when she is stressed and will return for follow-up in two weeks.

## 2016-10-28 NOTE — Telephone Encounter (Signed)
Pt is coming in today at 37, for an appointment with integrated care. Pt would like to know if Dr. Lindell Noe could speak with her just for a moment after her appointment, should be around 11:30am. Please advise. Thanks! ep

## 2016-10-28 NOTE — Progress Notes (Signed)
Reason for follow-up:  To discuss patient's social isolation, anger, and frustration with family  Issues discussed:  Patient spent good time with bestfriend from out of town, was not able to reconcile with her son. We discussed her future plans to move to [place] when she sells her house and the pros and cons of leaving [place]. Patient spoke to Dr. Lindell Noe at end of appointment about skin lesion on hand.  Identified goals:  The patient will practice visualizing being at the beach when she is feeling down or angry with her family. She will return in two weeks to see [facility].

## 2016-11-11 ENCOUNTER — Ambulatory Visit (INDEPENDENT_AMBULATORY_CARE_PROVIDER_SITE_OTHER): Payer: Medicare Other | Admitting: Psychology

## 2016-11-11 DIAGNOSIS — F32A Depression, unspecified: Secondary | ICD-10-CM

## 2016-11-11 DIAGNOSIS — F329 Major depressive disorder, single episode, unspecified: Secondary | ICD-10-CM

## 2016-11-11 NOTE — Progress Notes (Signed)
Reason for follow-up:  Patient facing depression and difficulties with irritability  Issues discussed:  We discussed patient's management of stressors over the past 2 weeks, her holiday plans, and strategies to cope with anger during the holidays. We also discussed patient's religious beliefs related to violence. Last, patient reports her hand wound is healing. She has been taking "1% Silver Sulfadiazine Cream" and would like [facility] to request this and previously prescribed form of tylenol for pain from patient's PCP.  Identified goals:  Patient will practice deep breathing when noticing early signs of anger (muscle tension, irritated thoughts) and visualize positive imagery during deep breathing. Patient will return December 11th at [facility] will communicate patient's medication requests to PCP.

## 2016-11-11 NOTE — Assessment & Plan Note (Signed)
Assessment/Plan/Recommendations: Patient showed more positive affect (smiling, laughing) and says the past two weeks have been better despite a childhood friend being killed recently in a car accident. She described stressors but says that she tries to remember what we discuss and not get angry. She spends time with grandkids and doesn't speak with her son, but says this works and keeps her from getting angry. We discussed how positive she feels when she is giving to others who are in greater need. Patient plans to donate clothing to her friend's family. Patient will be going to a close friends house for Thanksgiving. [facility] taught deep breathing and patient said she will try this when she first notices anger. Patient made joking comment about how our sessions are helpful and without them, she'd probably be in jail. [facility] clarified whether that was true and patient laughed and said she is scared of jail and wouldn't behave aggressively.

## 2016-11-11 NOTE — Patient Instructions (Addendum)
Rudi it was great to see you today!  Please take care of yourself over the Thanksgiving holiday! We discussed when youre starting to feel angry: 1) Deep breathing (breathe in to your stomach, out slowly) 2) Think about peaceful imagery while doing this (grandchildren)  I'll see you December 11th at [place].

## 2016-11-12 ENCOUNTER — Telehealth: Payer: Self-pay | Admitting: Family Medicine

## 2016-11-12 NOTE — Telephone Encounter (Signed)
I got a message from [facility] that Megan Kemp was requesting a new rx for silver sulfazine cream for her hand and some "special tylenol." Could you please call the patient and let her know that she can use an over the counter moisturizing ointment like vaseline or aquaphor for her hand, but that cream, which she borrowed from a friend, is for burns, and I wouldn't prescribe that for her skin condition. She can take regular tylenol or ibuprofen, and she can make an appointment if she is having worsening pain to the point that those aren't helping.

## 2016-11-12 NOTE — Telephone Encounter (Signed)
PT informed. Zimmerman Rumple, April D, CMA  

## 2016-12-02 ENCOUNTER — Ambulatory Visit: Payer: Medicare Other

## 2016-12-04 ENCOUNTER — Telehealth: Payer: Self-pay | Admitting: Family Medicine

## 2016-12-04 NOTE — Telephone Encounter (Signed)
Patient requesting refill on Acticin and Kenalog creams. Patient states she went to someone home and now is itching as if she may have scabies. Please advise.

## 2016-12-04 NOTE — Telephone Encounter (Signed)
Spoke with pt and advised of need for appt to be sure that the right medication is called in.  It has been over one year since her permetherin was called in.  She is agreeable, but does not want to come in until Monday since that is when her integrated care appt is.  Will see [facility] 12/09/16 @ 11:30. Fleeger, Salome Spotted, CMA

## 2016-12-06 ENCOUNTER — Ambulatory Visit: Payer: Medicare Other

## 2016-12-09 ENCOUNTER — Ambulatory Visit (INDEPENDENT_AMBULATORY_CARE_PROVIDER_SITE_OTHER): Payer: Medicare Other | Admitting: Family Medicine

## 2016-12-09 ENCOUNTER — Other Ambulatory Visit: Payer: Self-pay | Admitting: Family Medicine

## 2016-12-09 ENCOUNTER — Other Ambulatory Visit: Payer: Self-pay

## 2016-12-09 ENCOUNTER — Ambulatory Visit (INDEPENDENT_AMBULATORY_CARE_PROVIDER_SITE_OTHER): Payer: Medicare Other | Admitting: Psychology

## 2016-12-09 DIAGNOSIS — Z1231 Encounter for screening mammogram for malignant neoplasm of breast: Secondary | ICD-10-CM

## 2016-12-09 DIAGNOSIS — R21 Rash and other nonspecific skin eruption: Secondary | ICD-10-CM | POA: Diagnosis not present

## 2016-12-09 DIAGNOSIS — F32A Depression, unspecified: Secondary | ICD-10-CM

## 2016-12-09 DIAGNOSIS — F329 Major depressive disorder, single episode, unspecified: Secondary | ICD-10-CM

## 2016-12-09 MED ORDER — PREDNISONE 50 MG PO TABS: 50.0000 mg | ORAL_TABLET | Freq: Every day | ORAL | 0 refills | Status: DC

## 2016-12-09 MED ORDER — PERMETHRIN 5 % EX CREA: 1.0000 "application " | TOPICAL_CREAM | Freq: Once | CUTANEOUS | 0 refills | Status: AC

## 2016-12-09 NOTE — Progress Notes (Signed)
RASH Patient is complaining of a rash that started on her arms bilaterally and has since spread to her chest and back. Rash is itchy. No significant erythema. She notes some dryness over her body surfaces. She states that it all started when she visited one of her friend's houses and since that time she has felt very itchy.  Had rash for 2 months. Location: arms and back Medications tried: OTC hydrocortisone cream Similar rash in past: yes New medications or antibiotics: no Tick, Insect or new pet exposure: possibly  Recent travel: no New detergent or soap: no Immunocompromised: no  Symptoms Itching: yes Pain over rash: no Feeling ill all over: no Fever: no Mouth sores: no Face or tongue swelling: no Trouble breathing: no Joint swelling or pain: no  Review of Symptoms - see HPI PMH - Smoking status noted.    CC, SH/smoking status, and VS noted  Objective: BP 130/85   Pulse 74   Temp 97.5 F (36.4 C)   Wt 166 lb 6.4 oz (75.5 kg)   LMP 05/06/2011   SpO2 97%   BMI 25.30 kg/m  Gen: NAD, alert, cooperative. CV: Well-perfused. Resp: Non-labored. Neuro: Sensation intact throughout. Integument: Notable dry skin on the palms, interdigital spaces, flexor surfaces of her arms bilaterally, and back. Evidence of excoriations present bilaterally. No significant erythema. No evidence of specific papules, pustules, macules, plaques, vesicles or other lesions. No evidence of barrow tracking.  Assessment and plan:  Rash Patient is here with complaints of body pruritus. 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 pruritis secondary to skin dryness. Unfortunately, after discussing this with patient she was adamant that this was not the correct cause (skin dryness), saying "it's like it's crawling under my skin", and asked specifically for the treatment she had been given when she was diagnosed with scabies in  the past. Due to the relative benign course of permethrin cream as well as her symptoms that do not quite point out a specific etiology (there were no specific lesions/nodules) I will treat for scabies as the patient desires. I feel as though permethrin use will be able to rule out the possibility of scabies and we will be able to move forward with a skin moisturizing regimen, as I believe it is much more likely that patient will benefit from an aggressive skin moisturizing regimen. - Permethrin - Short course of oral steroids for pruritus    Meds ordered this encounter  Medications  . permethrin (ELIMITE) 5 % cream    Sig: Apply 1 application topically once.    Dispense:  60 g    Refill:  0  . predniSONE (DELTASONE) 50 MG tablet    Sig: Take 1 tablet (50 mg total) by mouth daily with breakfast.    Dispense:  5 tablet    Refill:  0     Elberta Leatherwood, MD,MS,  PGY3 12/09/2016 5:21 PM

## 2016-12-09 NOTE — Assessment & Plan Note (Signed)
Assessment/Plan/Recommendations: Patient is having difficulty with a neighbor who is filing complaints on her about her property in order to "get back at her" for not helping with medical issues. We discussed pro's and con's of placing a aggressive sign in her front yard. Megan Kemp congratulated patient on managing her anger in this situation thus far. Patient is reconnecting with churches and reports enjoying spending time and being around people who she feels, expect little from her and enjoy her company. She is also spending more time with her grandchildren while ignoring her son with whom she feels intense anger. She is also receiving her pension soon and is looking forward to taking a vacation with this money. Overall, patient is proud of herself, and will continue to manage anger by using the coping phrase "it's god's job, not mine" when she feels wronged and would like to teach others a Set designer. Patient will return January 8th.

## 2016-12-09 NOTE — Progress Notes (Signed)
Reason for follow-up:  Anger and interpersonal difficulties.  Issues discussed:  Patient receiving complaints about her property (car parking, front yard) put in by neighbor. Patient is attending churches regularly and feeling more connected to spirituality.  Identified goals:  Patient generated and will use coping phrase "it's god's job, not mine" when she is very frustrated with others and wants to address it with anger.

## 2016-12-09 NOTE — Assessment & Plan Note (Addendum)
Patient is here with complaints of body pruritus. 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 pruritis secondary to skin dryness. Unfortunately, after discussing this with patient she was adamant that this was not the correct cause (skin dryness), saying "it's like it's crawling under my skin", and asked specifically for the treatment she had been given when she was diagnosed with scabies in the past. Due to the relative benign course of permethrin cream as well as her symptoms that do not quite point out a specific etiology (there were no specific lesions/nodules) I will treat for scabies as the patient desires. I feel as though permethrin use will be able to rule out the possibility of scabies and we will be able to move forward with a skin moisturizing regimen, as I believe it is much more likely that patient will benefit from an aggressive skin moisturizing regimen. - Permethrin - Short course of oral steroids for pruritus

## 2016-12-09 NOTE — Patient Instructions (Signed)
Scabies, Adult Introduction Scabies is a skin condition that happens when very small insects get under the skin (infestation). This causes a rash and severe itchiness. Scabies can spread from person to person (is contagious). If you get scabies, it is common for others in your household to get scabies too. With proper treatment, symptoms usually go away in 2-4 weeks. Scabies usually does not cause lasting problems. What are the causes? This condition is caused by mites (Sarcoptes scabiei, or human itch mites) that can only be seen with a microscope. The mites get into the top layer of skin and lay eggs. Scabies can spread from person to person through:  Close contact with a person who has scabies.  Contact with infested items, such as towels, bedding, or clothing. What increases the risk? This condition is more likely to develop in:  People who live in nursing homes and other extended-care facilities.  People who have sexual contact with a partner who has scabies.  Young children who attend child care facilities.  People who care for others who are at increased risk for scabies. What are the signs or symptoms? Symptoms of this condition may include:  Severe itchiness. This is often worse at night.  A rash that includes tiny red bumps or blisters. The rash commonly occurs on the wrist, elbow, armpit, fingers, waist, groin, or buttocks. Bumps may form a line (burrow) in some areas.  Skin irritation. This can include scaly patches or sores. How is this diagnosed? This condition is diagnosed with a physical exam. Your health care provider will look closely at your skin. In some cases, your health care provider may take a sample of your affected skin (skin scraping) and have it examined under a microscope. How is this treated? This condition may be treated with:  Medicated cream or lotion that kills the mites. This is spread on the entire body and left on for several hours. Usually, one  treatment with medicated cream or lotion is enough to kill all of the mites. In severe cases, the treatment may be repeated.  Medicated cream that relieves itching.  Medicines that help to relieve itching.  Medicines that kill the mites. This treatment is rarely used. Follow these instructions at home:   Medicines  Take or apply over-the-counter and prescription medicines as told by your health care provider.  Apply medicated cream or lotion as told by your health care provider.  Do not wash off the medicated cream or lotion until the necessary amount of time has passed. Skin Care  Avoid scratching your affected skin.  Keep your fingernails closely trimmed to reduce injury from scratching.  Take cool baths or apply cool washcloths to help reduce itching. General instructions  Clean all items that you recently had contact with, including bedding, clothing, and furniture. Do this on the same day that your treatment starts.  Use hot water when you wash items.  Place unwashable items into closed, airtight plastic bags for at least 3 days. The mites cannot live for more than 3 days away from human skin.  Vacuum furniture and mattresses that you use.  Make sure that other people who may have been infested are examined by a health care provider. These include members of your household and anyone who may have had contact with infested items.  Keep all follow-up visits as told by your health care provider. This is important. Contact a health care provider if:  You have itching that does not go away after 4  weeks of treatment.  You continue to develop new bumps or burrows.  You have redness, swelling, or pain in your rash area after treatment.  You have fluid, blood, or pus coming from your rash. This information is not intended to replace advice given to you by your health care provider. Make sure you discuss any questions you have with your health care provider. Document  Released: 08/30/2015 Document Revised: 05/16/2016 Document Reviewed: 07/11/2015  2017 Elsevier

## 2016-12-24 ENCOUNTER — Other Ambulatory Visit: Payer: Self-pay | Admitting: Family Medicine

## 2016-12-30 ENCOUNTER — Ambulatory Visit (INDEPENDENT_AMBULATORY_CARE_PROVIDER_SITE_OTHER): Payer: Medicare Other | Admitting: Psychology

## 2016-12-30 DIAGNOSIS — F32A Depression, unspecified: Secondary | ICD-10-CM

## 2016-12-30 DIAGNOSIS — F329 Major depressive disorder, single episode, unspecified: Secondary | ICD-10-CM

## 2016-12-30 NOTE — Progress Notes (Signed)
Reason for follow-up:  Anger management and interpersonal difficulties  Issues discussed:  Patient coping with death of grandniece and interpersonal challenges over the last few weeks  Identified goals:  Patient is making progress in spending time with people she enjoys, managing anger, and avoiding triggers. She will return for follow-up in 3 weeks.

## 2016-12-30 NOTE — Assessment & Plan Note (Signed)
Assessment/Plan/Recommendations: Patient has been doing relatively better. Though she has days where she feels alone and down, she reports that spending time with friends she enjoys makes this much better. She is doing a good job avoiding people that frustrate her. Despite family tragedy of grandniece's death, she was able to attend funeral without fighting with particular family members. She is also receiving her person soon and excited about taking a vacation. [facility] suggested to patient that given her progress, we could push next appointment to 3 weeks out, patient left it up to [facility] and agreed.

## 2017-01-03 ENCOUNTER — Other Ambulatory Visit: Payer: Self-pay | Admitting: Family Medicine

## 2017-01-05 ENCOUNTER — Other Ambulatory Visit: Payer: Self-pay

## 2017-01-05 ENCOUNTER — Encounter (HOSPITAL_COMMUNITY): Payer: Self-pay | Admitting: Emergency Medicine

## 2017-01-05 ENCOUNTER — Emergency Department (HOSPITAL_COMMUNITY): Payer: Medicare Other

## 2017-01-05 ENCOUNTER — Emergency Department (HOSPITAL_COMMUNITY)
Admission: EM | Admit: 2017-01-05 | Discharge: 2017-01-05 | Disposition: A | Discharge status: Home / Self Care | Payer: Medicare Other | Attending: Emergency Medicine | Admitting: Emergency Medicine

## 2017-01-05 DIAGNOSIS — R112 Nausea with vomiting, unspecified: Secondary | ICD-10-CM | POA: Insufficient documentation

## 2017-01-05 DIAGNOSIS — Z79899 Other long term (current) drug therapy: Secondary | ICD-10-CM | POA: Insufficient documentation

## 2017-01-05 DIAGNOSIS — I10 Essential (primary) hypertension: Secondary | ICD-10-CM | POA: Insufficient documentation

## 2017-01-05 DIAGNOSIS — Z91040 Latex allergy status: Secondary | ICD-10-CM | POA: Insufficient documentation

## 2017-01-05 DIAGNOSIS — R197 Diarrhea, unspecified: Secondary | ICD-10-CM | POA: Diagnosis not present

## 2017-01-05 LAB — BASIC METABOLIC PANEL
Anion gap: 5 (ref 5–15)
BUN: 11 mg/dL (ref 6–20)
CALCIUM: 8.9 mg/dL (ref 8.9–10.3)
CO2: 29 mmol/L (ref 22–32)
CREATININE: 0.81 mg/dL (ref 0.44–1.00)
Chloride: 102 mmol/L (ref 101–111)
GFR calc Af Amer: >60 mL/min (ref >60)
GFR calc non Af Amer: >60 mL/min (ref >60)
GLUCOSE: 87 mg/dL (ref 65–99)
Potassium: 4.1 mmol/L (ref 3.5–5.1)
Sodium: 136 mmol/L (ref 135–145)

## 2017-01-05 LAB — CBC
HCT: 37.2 % (ref 36.0–46.0)
HEMOGLOBIN: 12 g/dL (ref 12.0–15.0)
MCH: 29.1 pg (ref 26.0–34.0)
MCHC: 32.3 g/dL (ref 30.0–36.0)
MCV: 90.1 fL (ref 78.0–100.0)
PLATELETS: 349 10*3/uL (ref 150–400)
RBC: 4.13 MIL/uL (ref 3.87–5.11)
RDW: 14.3 % (ref 11.5–15.5)
WBC: 4 10*3/uL (ref 4.0–10.5)

## 2017-01-05 LAB — URINALYSIS, ROUTINE W REFLEX MICROSCOPIC
BILIRUBIN URINE: NEGATIVE
GLUCOSE, UA: NEGATIVE mg/dL
HGB URINE DIPSTICK: NEGATIVE
KETONES UR: 5 mg/dL — AB
Leukocytes, UA: NEGATIVE
Nitrite: NEGATIVE
PH: 7 (ref 5.0–8.0)
Protein, ur: NEGATIVE mg/dL
SPECIFIC GRAVITY, URINE: 1.009 (ref 1.005–1.030)

## 2017-01-05 LAB — I-STAT TROPONIN, ED
TROPONIN I, POC: 0 ng/mL (ref 0.00–0.08)
Troponin i, poc: 0 ng/mL (ref 0.00–0.08)

## 2017-01-05 MED ORDER — SODIUM CHLORIDE 0.9 % IV BOLUS (SEPSIS)
1000.0000 mL | Freq: Once | INTRAVENOUS | Status: AC
  Administered 2017-01-05: 1000 mL via INTRAVENOUS

## 2017-01-05 MED ORDER — KETOROLAC TROMETHAMINE 30 MG/ML IJ SOLN
30.0000 mg | Freq: Once | INTRAMUSCULAR | Status: AC
  Administered 2017-01-05: 30 mg via INTRAVENOUS
  Filled 2017-01-05: qty 1

## 2017-01-05 MED ORDER — ONDANSETRON HCL 4 MG/2ML IJ SOLN
4.0000 mg | Freq: Once | INTRAMUSCULAR | Status: AC
  Administered 2017-01-05: 4 mg via INTRAVENOUS
  Filled 2017-01-05: qty 2

## 2017-01-05 MED ORDER — ONDANSETRON HCL 4 MG PO TABS: 4.0000 mg | ORAL_TABLET | Freq: Four times a day (QID) | ORAL | 0 refills | Status: DC | PRN

## 2017-01-05 NOTE — Discharge Instructions (Signed)
You likely have the stomach flu. Give yourself 1-2 weeks to feel better. Take nausea medications for nausea and vomiting.  Return for worsening symptoms, including severe abdominal pain, intractable vomiting, passing out, confusion, or any other symptoms concerning to you.

## 2017-01-05 NOTE — ED Notes (Signed)
ED Provider at bedside. 
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
## 2017-01-05 NOTE — ED Triage Notes (Signed)
Pt c/o diarrhea, emesis, chest pains radiating into left shoulder, body achesx 1 week.

## 2017-01-10 ENCOUNTER — Ambulatory Visit
Admission: RE | Admit: 2017-01-10 | Discharge: 2017-01-10 | Disposition: A | Discharge status: Home / Self Care | Payer: Medicare Other | Source: Ambulatory Visit | Attending: Family Medicine | Admitting: Family Medicine

## 2017-01-10 DIAGNOSIS — Z1231 Encounter for screening mammogram for malignant neoplasm of breast: Secondary | ICD-10-CM

## 2017-01-20 ENCOUNTER — Ambulatory Visit: Payer: Medicare Other

## 2017-01-20 DIAGNOSIS — F32A Depression, unspecified: Secondary | ICD-10-CM

## 2017-01-20 DIAGNOSIS — F329 Major depressive disorder, single episode, unspecified: Secondary | ICD-10-CM
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
## 2017-01-20 NOTE — Progress Notes (Unsigned)
Reason for follow-up:  Patient to check in with [facility] about anger and interpersonal difficulties  Issues discussed:  Patient inquired whether her cousin could see [facility] as well. She would also like to move back to follow-up appointments every 2 weeks. [facility] discussed importance of developing repertoire of skills and resources to continue progress in future.   Identified goals:  Patient will return in 2 weeks for follow-up. [facility] will check in about patient's brother (not [facility] patient) with Dr. Gwenlyn Saran.

## 2017-01-30 ENCOUNTER — Other Ambulatory Visit: Payer: Self-pay | Admitting: Family Medicine

## 2017-01-31 ENCOUNTER — Telehealth: Payer: Self-pay | Admitting: Family Medicine

## 2017-01-31 NOTE — Telephone Encounter (Signed)
Made pt an appt with me on Monday for PAP and HTN recheck as I got a refill for BP meds this week. Left a VM for Ms. Wiechman and asked her to call back to confirm that 11am on Monday would work.  Ralene Ok, MD

## 2017-02-03 ENCOUNTER — Encounter: Payer: Self-pay | Admitting: Family Medicine

## 2017-02-03 ENCOUNTER — Ambulatory Visit (INDEPENDENT_AMBULATORY_CARE_PROVIDER_SITE_OTHER): Payer: Medicare Other | Admitting: Family Medicine

## 2017-02-03 ENCOUNTER — Ambulatory Visit (INDEPENDENT_AMBULATORY_CARE_PROVIDER_SITE_OTHER): Payer: Medicare Other | Admitting: Psychology

## 2017-02-03 DIAGNOSIS — R21 Rash and other nonspecific skin eruption: Secondary | ICD-10-CM | POA: Diagnosis not present

## 2017-02-03 DIAGNOSIS — F329 Major depressive disorder, single episode, unspecified: Secondary | ICD-10-CM

## 2017-02-03 DIAGNOSIS — I10 Essential (primary) hypertension: Secondary | ICD-10-CM

## 2017-02-03 DIAGNOSIS — F411 Generalized anxiety disorder: Secondary | ICD-10-CM

## 2017-02-03 DIAGNOSIS — F32A Depression, unspecified: Secondary | ICD-10-CM

## 2017-02-03 MED ORDER — HYDROCORTISONE 1 % EX CREA: 1.0000 "application " | TOPICAL_CREAM | Freq: Two times a day (BID) | CUTANEOUS | 0 refills | Status: DC

## 2017-02-03 NOTE — Progress Notes (Signed)
   CC: HTN f/u  HPI I called patient for follow-up appointment as we were monitoring her blood pressure and she requested refills. Blood pressure today is low, patient reports that she only needed blood pressure medicine due to anger issues in the past.  Regarding kind screening, patient had a hysterectomy with [facility]. On my review of the op note it appears to have been a total hysterectomy with cuff. After further questioning, patient reports that [facility] told her that she did not need Pap smears any longer.  Notably, patient reports continued itching of her extremities. Feels as though there are things biting her. Had previously been on fluoxetine and Depakote, but was never able to make it to see the psychiatrist.   Socially, patient apparently just discovered this morning via telephone call that one of her friends passed away from cancer.   CC, SH/smoking status, and VS noted  Objective: BP 96/60 (BP Location: Right Arm, Patient Position: Sitting, Cuff Size: Normal)   Pulse 75   Temp 97.9 F (36.6 C) (Oral)   Ht 5\' 8"  (1.727 m)   Wt 169 lb (76.7 kg)   LMP 08/15/2011   SpO2 97%   BMI 25.70 kg/m  Gen: NAD, alert, cooperative, and tearful.  HEENT: NCAT, EOMI, PERRL CV: RRR, no murmur Resp: CTAB, no wheezes, non-labored Neuro: Alert and oriented, Speech clear, No gross deficits  Assessment and plan:  HTN (hypertension) Blood pressure 96/60 today. Patient has not had lisinopril for several days. Previously had been on 5 mg of lisinopril. Told to discontinue blood pressure medicines at this time, and will monitor. Return to clinic in 3 months  Rash Patient has extensive history of itching and perception of insects biting her. Today, again she reports waking up in the night with feelings of insects biting her particularly after visiting a friend's house. On skin exam she has 1 pinpoint erythematous lesion between her breasts, and some mild, diffuse redness over her right  extremity immediately after her scratching it.  I am suspicious that her anxiety is causing her to itch, and I shared this with her. She is willing to see a psychiatrist, but was insistent on a topical medicines at this time. Given hydrocortisone 1% cream. Ambulatory referral for psychiatry place, patient would prefer alternative provider to [facility], will have her follow-up in 3 months. Continuing counseling with [facility] here today.  Generalized anxiety disorder History of agoraphobia, now with persistent perception of insects biting her. Appropriately tearful today after finding out of a friend's passing. Ambulatory referral placed to psychiatry for full evaluation and possible additional medications. Had previously been on Depakote and fluoxetine.   Orders Placed This Encounter  Procedures  . Ambulatory referral to Psychiatry    Referral Priority:   Routine    Referral Type:   Psychiatric    Referral Reason:   Specialty Services Required    Requested Specialty:   Psychiatry    Number of Visits Requested:   1    Meds ordered this encounter  Medications  . hydrocortisone cream 1 %    Sig: Apply 1 application topically 2 (two) times daily.    Dispense:  30 g    Refill:  0     Ralene Ok, MD, PGY1 02/03/2017 12:05 PM

## 2017-02-03 NOTE — Progress Notes (Signed)
Reason for follow-up:  Check in about patient's anger and interpersonal difficulties.  Issues discussed:  She recently lost one of her best-friends and is anticipating birthday of her deceased mother. Patient has been itching arms and body more which she and her physician suspect may be due to anxiety. Patient is now open to referral to psychiatrist.   Identified goals:  Patient will receive a call from psychiatry to schedule appointment. She will practice deep breathing at night when scratching. Patient will spend time at mom's grave on mom's birthday. She will return for follow-up in 2 weeks.

## 2017-02-03 NOTE — Patient Instructions (Signed)
It was a pleasure to see you today! Thank you for choosing [facility] for your primary care. Lynisha L Bockus was seen for blood pressure recheck and itching. Come back to the clinic in 3 months. I will send a referral for the psychiatrist and they should call you.   Best,  Dr. Lindell Noe

## 2017-02-03 NOTE — Assessment & Plan Note (Signed)
Assessment/Plan/Recommendations: Despite losing close friend recently, patient reports she generally feels good. However, she was much more tearful than in recent appointments in thinking about her deceased mother. Patient's demeanor different in today's appointment, she was quieter, more tearful, and less angry. After discussing with Dr. Lindell Noe, she is open to psychiatric referral as well. Patient's itching has increased and was noticeable during appointment. [facility] and patient practiced deep breathing which patient found helpful during appointment and agreed to try at home when feeling itchy. Patient encouraged to spend time at mom's grave over next week and will return for follow-up in 2 weeks.

## 2017-02-03 NOTE — Assessment & Plan Note (Signed)
History of agoraphobia, now with persistent perception of insects biting her. Appropriately tearful today after finding out of a friend's passing. Ambulatory referral placed to psychiatry for full evaluation and possible additional medications. Had previously been on Depakote and fluoxetine.

## 2017-02-03 NOTE — Assessment & Plan Note (Addendum)
Patient has extensive history of itching and perception of insects biting her. Today, again she reports waking up in the night with feelings of insects biting her particularly after visiting a friend's house. On skin exam she has 1 pinpoint erythematous lesion between her breasts, and some mild, diffuse redness over her right extremity immediately after her scratching it.  I am suspicious that her anxiety is causing her to itch, and I shared this with her. She is willing to see a psychiatrist, but was insistent on a topical medicines at this time. Given hydrocortisone 1% cream. Ambulatory referral for psychiatry place, patient would prefer alternative provider to [facility], will have her follow-up in 3 months. Continuing counseling with [place] here today.

## 2017-02-03 NOTE — Assessment & Plan Note (Signed)
Blood pressure 96/60 today. Patient has not had lisinopril for several days. Previously had been on 5 mg of lisinopril. Told to discontinue blood pressure medicines at this time, and will monitor. Return to clinic in 3 months

## 2017-02-04 ENCOUNTER — Telehealth: Payer: Self-pay | Admitting: Family Medicine

## 2017-02-04 NOTE — Telephone Encounter (Signed)
Patient states that hydrocortisone 1% cream given by Dr. Lindell Noe is not working for her rash. She is very uncomfortable. She is requesting something stronger. Please advise.

## 2017-02-04 NOTE — Telephone Encounter (Signed)
Patient can be seen for rash in a same day visit tomorrow, but I told her yesterday that I think it is likely a neurochemical imbalance causing her to itch. I placed a psych referral yesterday. Maybe we could change that to urgent? I will be out of town until Monday.

## 2017-02-05 NOTE — Telephone Encounter (Signed)
Spoke with pt and gave her the below information and she said that she would just wait for the referral to go through. Katharina Caper, April D, [place]

## 2017-02-05 NOTE — Telephone Encounter (Signed)
Psych referral has already been sent to [facility], and due to the security of the referral, I am no longer able to see it once sent. I am unable to change it at this time, but will call [facility] tomorrow to see if they can get patient in soon.   April & Nehemiah Settle, can you call patient and advise her of this?  Thanks!

## 2017-02-10 NOTE — Telephone Encounter (Signed)
Called to check status at [facility]. Awating callback from Joslin, referral coordinator.

## 2017-02-17 ENCOUNTER — Ambulatory Visit: Payer: Medicare Other

## 2017-02-24 ENCOUNTER — Ambulatory Visit (INDEPENDENT_AMBULATORY_CARE_PROVIDER_SITE_OTHER): Payer: Medicare Other | Admitting: Psychology

## 2017-02-24 DIAGNOSIS — F32A Depression, unspecified: Secondary | ICD-10-CM

## 2017-02-24 DIAGNOSIS — F329 Major depressive disorder, single episode, unspecified: Secondary | ICD-10-CM

## 2017-02-24 NOTE — Assessment & Plan Note (Signed)
Assessment/Plan/Recommendations: Brief appointment today given mix-up about appointment time. Patient is doing well and does not have any concerns. She spent quality time with friends from church and was able to ignore when family frustrated her. She got a dog on valentine's day which seems to be excellent company at home. Patient reported her hand rash is healing and her itching has reduced. She says she has been trying to relax more at night (deep breathing, spending time with dog). She has not received a call from the psychiatry referral we have placed. [facility] will check-in with Dr. Lindell Noe about this and see patient in 2 weeks.

## 2017-02-24 NOTE — Progress Notes (Signed)
Reason for follow-up:  Anger and Interpersonal difficulties, anxiety  Issues discussed:  Patient managing interpersonal stressors well. She feels it is easier to let go when she feels "wronged" by others. Her hand rash is healing and her itching at night has decreased. She states she has not received a call from the psychiatry referral we placed.  Identified goals:  Patient will return in two weeks for follow-up. [facility] will check in about psychiatry referral.

## 2017-03-10 ENCOUNTER — Ambulatory Visit: Payer: Medicare Other | Admitting: Psychology

## 2017-03-10 DIAGNOSIS — F329 Major depressive disorder, single episode, unspecified: Secondary | ICD-10-CM

## 2017-03-10 DIAGNOSIS — F32A Depression, unspecified: Secondary | ICD-10-CM
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
## 2017-03-24 ENCOUNTER — Telehealth: Payer: Self-pay | Admitting: Family Medicine

## 2017-03-24 ENCOUNTER — Ambulatory Visit (INDEPENDENT_AMBULATORY_CARE_PROVIDER_SITE_OTHER): Payer: Medicare Other | Admitting: Psychology

## 2017-03-24 DIAGNOSIS — F32A Depression, unspecified: Secondary | ICD-10-CM

## 2017-03-24 DIAGNOSIS — F329 Major depressive disorder, single episode, unspecified: Secondary | ICD-10-CM

## 2017-03-24 NOTE — Progress Notes (Signed)
Reason for follow-up:  Anger and interpersonal difficulties  Issues discussed:  Patient's management of ongoing issue involving cousin is improving. [facility] introduced wise mind model which patient seemed to respond well to.  Identified goals:  Patient will speak to cousin after he gets paid but is confident she can speak to him without aggressive actions. She will return in 2 weeks.

## 2017-03-24 NOTE — Telephone Encounter (Signed)
Application for disability parking  form dropped off for at front desk for completion.  Verified that patient section of form has been completed.  Last DOS/WCC with PCP was 03/24/17  Placed form in team folder to be completed by clinical staff.  Crista Luria

## 2017-03-24 NOTE — Assessment & Plan Note (Signed)
Assessment/Plan/Recommendations: Patient's affect positive. She has been doing well in the last two weeks which she attributes to attending church regularly once again and spending less time with her family. She feels calmer about the situation with her cousin. She believes this is because of her time at church as speaking with her pastor. We discussed the wise-mind model in handling her difficulty with her cousin integrating both her anger and knowledge of pros and cons of actions to find the right decision. Patient responded well to the idea and generated examples of how she might use the model to guide her behavior. Patient will drop off handicap parking renewal at the front office for her PCP and return in 2 weeks. Patient will meet with psychiatrist on 4/24.

## 2017-03-26 NOTE — Telephone Encounter (Signed)
Clinical info completed on Disability placard form.  Place form in Dr. Rosario Jacks box for completion.  Megan Kemp, Clarece Drzewiecki D, [place]

## 2017-03-27 NOTE — Telephone Encounter (Signed)
Patient informed that handicap form was completed and placed up front for pickup.  Derl Barrow, RN

## 2017-04-07 ENCOUNTER — Ambulatory Visit: Payer: Medicare Other

## 2017-04-13 ENCOUNTER — Emergency Department (HOSPITAL_COMMUNITY)
Admission: EM | Admit: 2017-04-13 | Discharge: 2017-04-13 | Disposition: A | Discharge status: Home / Self Care | Payer: Medicare Other | Attending: Emergency Medicine | Admitting: Emergency Medicine

## 2017-04-13 ENCOUNTER — Emergency Department (HOSPITAL_COMMUNITY): Payer: Medicare Other

## 2017-04-13 ENCOUNTER — Encounter (HOSPITAL_COMMUNITY): Payer: Self-pay | Admitting: Adult Health

## 2017-04-13 DIAGNOSIS — N200 Calculus of kidney: Secondary | ICD-10-CM

## 2017-04-13 DIAGNOSIS — Z79899 Other long term (current) drug therapy: Secondary | ICD-10-CM | POA: Diagnosis not present

## 2017-04-13 DIAGNOSIS — Z91040 Latex allergy status: Secondary | ICD-10-CM | POA: Diagnosis not present

## 2017-04-13 DIAGNOSIS — N139 Obstructive and reflux uropathy, unspecified: Secondary | ICD-10-CM | POA: Insufficient documentation

## 2017-04-13 DIAGNOSIS — N1330 Unspecified hydronephrosis: Secondary | ICD-10-CM

## 2017-04-13 DIAGNOSIS — R1031 Right lower quadrant pain: Secondary | ICD-10-CM | POA: Diagnosis present

## 2017-04-13 DIAGNOSIS — N132 Hydronephrosis with renal and ureteral calculous obstruction: Secondary | ICD-10-CM | POA: Insufficient documentation

## 2017-04-13 LAB — CBC
HEMATOCRIT: 38.2 % (ref 36.0–46.0)
Hemoglobin: 12.6 g/dL (ref 12.0–15.0)
MCH: 29.2 pg (ref 26.0–34.0)
MCHC: 33 g/dL (ref 30.0–36.0)
MCV: 88.4 fL (ref 78.0–100.0)
PLATELETS: 358 10*3/uL (ref 150–400)
RBC: 4.32 MIL/uL (ref 3.87–5.11)
RDW: 14.1 % (ref 11.5–15.5)
WBC: 7.9 10*3/uL (ref 4.0–10.5)

## 2017-04-13 LAB — URINALYSIS, ROUTINE W REFLEX MICROSCOPIC
BACTERIA UA: NONE SEEN
Bilirubin Urine: NEGATIVE
Glucose, UA: NEGATIVE mg/dL
Hgb urine dipstick: NEGATIVE
Ketones, ur: NEGATIVE mg/dL
Leukocytes, UA: NEGATIVE
Nitrite: NEGATIVE
PH: 9 — AB (ref 5.0–8.0)
Protein, ur: 30 mg/dL — AB
SPECIFIC GRAVITY, URINE: 1.025 (ref 1.005–1.030)

## 2017-04-13 LAB — COMPREHENSIVE METABOLIC PANEL
ALBUMIN: 4.1 g/dL (ref 3.5–5.0)
ALT: 22 U/L (ref 14–54)
AST: 28 U/L (ref 15–41)
Alkaline Phosphatase: 58 U/L (ref 38–126)
Anion gap: 11 (ref 5–15)
BILIRUBIN TOTAL: 0.6 mg/dL (ref 0.3–1.2)
BUN: 15 mg/dL (ref 6–20)
CHLORIDE: 104 mmol/L (ref 101–111)
CO2: 25 mmol/L (ref 22–32)
CREATININE: 1.04 mg/dL — AB (ref 0.44–1.00)
Calcium: 9.2 mg/dL (ref 8.9–10.3)
GFR calc Af Amer: >60 mL/min (ref >60)
GFR calc non Af Amer: 60 mL/min — ABNORMAL LOW (ref >60)
GLUCOSE: 159 mg/dL — AB (ref 65–99)
Potassium: 3.5 mmol/L (ref 3.5–5.1)
Sodium: 140 mmol/L (ref 135–145)
TOTAL PROTEIN: 7.4 g/dL (ref 6.5–8.1)

## 2017-04-13 LAB — PREGNANCY, URINE: PREG TEST UR: NEGATIVE

## 2017-04-13 LAB — LIPASE, BLOOD: Lipase: 24 U/L (ref 11–51)

## 2017-04-13 MED ORDER — ONDANSETRON 8 MG PO TBDP
8.0000 mg | ORAL_TABLET | Freq: Once | ORAL | Status: AC
  Administered 2017-04-13: 8 mg via ORAL
  Filled 2017-04-13: qty 1

## 2017-04-13 MED ORDER — TAMSULOSIN HCL 0.4 MG PO CAPS: 0.4000 mg | ORAL_CAPSULE | Freq: Every day | ORAL | 0 refills | Status: DC

## 2017-04-13 MED ORDER — KETOROLAC TROMETHAMINE 30 MG/ML IJ SOLN
30.0000 mg | Freq: Once | INTRAMUSCULAR | Status: AC
  Administered 2017-04-13: 30 mg via INTRAVENOUS
  Filled 2017-04-13: qty 1

## 2017-04-13 MED ORDER — ONDANSETRON 8 MG PO TBDP: 8.0000 mg | ORAL_TABLET | Freq: Three times a day (TID) | ORAL | 0 refills | Status: DC | PRN

## 2017-04-13 MED ORDER — FENTANYL CITRATE (PF) 100 MCG/2ML IJ SOLN
50.0000 ug | Freq: Once | INTRAMUSCULAR | Status: AC
  Administered 2017-04-13: 50 ug via INTRAVENOUS
  Filled 2017-04-13: qty 2

## 2017-04-13 MED ORDER — OXYCODONE-ACETAMINOPHEN 5-325 MG PO TABS: 1.0000 | ORAL_TABLET | ORAL | 0 refills | Status: DC | PRN

## 2017-04-13 MED ORDER — TAMSULOSIN HCL 0.4 MG PO CAPS
0.4000 mg | ORAL_CAPSULE | Freq: Once | ORAL | Status: AC
  Administered 2017-04-13: 0.4 mg via ORAL
  Filled 2017-04-13: qty 1

## 2017-04-13 NOTE — Discharge Instructions (Signed)
Please read instructions below. Talk with your primary care provider about any new medications. You can take 1-2 of the oxycodone every 4-6 hours as needed for pain. You can also take 600mg  of advil (ibuprofen) every 6 hours as needed for additional pain control. Do not take tylenol with oxycodone. Take flomax (tamsulosin) once a day. You can take zofran every 8 hours as needed for nausea. Call the Urology office in the morning to schedule a follow up appointment. Return to the ER for fever, inability to urinate, or new or worsening symptoms.
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
## 2017-04-13 NOTE — ED Notes (Signed)
Delay in collecting a urine sample, EMS took the pt to the bathroom when she first came in.

## 2017-04-13 NOTE — ED Triage Notes (Signed)
Presents with RLQ abdominal pain began last night and worsened today while at church associated with nausea, vomiting, diarhea. Pain described a sharp and non-radiating.  feteal position makes pain better. Given 4 mg zofran prior to arrival.

## 2017-04-14 ENCOUNTER — Ambulatory Visit (INDEPENDENT_AMBULATORY_CARE_PROVIDER_SITE_OTHER): Payer: Medicare Other | Admitting: Psychology

## 2017-04-14 DIAGNOSIS — F32A Depression, unspecified: Secondary | ICD-10-CM

## 2017-04-14 DIAGNOSIS — F329 Major depressive disorder, single episode, unspecified: Secondary | ICD-10-CM
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
## 2017-04-15 ENCOUNTER — Ambulatory Visit (HOSPITAL_COMMUNITY): Payer: Medicare Other | Admitting: Psychiatry

## 2017-04-24 ENCOUNTER — Other Ambulatory Visit: Payer: Self-pay | Admitting: Urology

## 2017-04-30 ENCOUNTER — Encounter: Payer: Self-pay | Admitting: Family Medicine

## 2017-04-30 ENCOUNTER — Ambulatory Visit (INDEPENDENT_AMBULATORY_CARE_PROVIDER_SITE_OTHER): Payer: Medicare Other | Admitting: Family Medicine

## 2017-04-30 DIAGNOSIS — H6501 Acute serous otitis media, right ear: Secondary | ICD-10-CM

## 2017-04-30 DIAGNOSIS — H6690 Otitis media, unspecified, unspecified ear: Secondary | ICD-10-CM | POA: Insufficient documentation

## 2017-04-30 MED ORDER — AMOXICILLIN 500 MG PO CAPS: 500.0000 mg | ORAL_CAPSULE | Freq: Two times a day (BID) | ORAL | 0 refills | Status: DC

## 2017-04-30 MED ORDER — AMOXICILLIN 500 MG PO CAPS: 500.0000 mg | ORAL_CAPSULE | Freq: Three times a day (TID) | ORAL | 0 refills | Status: DC

## 2017-04-30 NOTE — Patient Instructions (Signed)
Start taking amoxicillin twice daily for 7 days total.  If your symptoms do not resolve, please follow up in clinic.   Otitis Media, Adult Otitis media is redness, soreness, and puffiness (swelling) in the space just behind your eardrum (middle ear). It may be caused by allergies or infection. It often happens along with a cold. Follow these instructions at home:  Take your medicine as told. Finish it even if you start to feel better.  Only take over-the-counter or prescription medicines for pain, discomfort, or fever as told by your doctor.  Follow up with your doctor as told. Contact a doctor if:  You have otitis media only in one ear, or bleeding from your nose, or both.  You notice a lump on your neck.  You are not getting better in 3-5 days.  You feel worse instead of better. Get help right away if:  You have pain that is not helped with medicine.  You have puffiness, redness, or pain around your ear.  You get a stiff neck.  You cannot move part of your face (paralysis).  You notice that the bone behind your ear hurts when you touch it. This information is not intended to replace advice given to you by your health care provider. Make sure you discuss any questions you have with your health care provider. Document Released: 05/27/2008 Document Revised: 05/16/2016 Document Reviewed: 07/06/2013 Elsevier Interactive Patient Education  2017 [employer].

## 2017-04-30 NOTE — Assessment & Plan Note (Addendum)
Exam consistent with otitis media on the right. Her cerumen impaction most likely contributed to this. Overall pt is well appearing.  - amoxicillin BID x 7 days - strict return precautions discussed.

## 2017-04-30 NOTE — Progress Notes (Signed)
Subjective: CC: ear pain  HPI: Patient is a 56 y.o. female presenting to clinic today for "waves in ears".  Patient notes R ear pain x 1 week. It feels like she's "on a roller coaster." and there are "waves in her ear.  She notes feeling like she's under the water; sounds are muffled.  Dull ache as well.  She feels off balance but denies dizziness.  Endorses pulsatile tinnitus.  No N/V.  Maybe some rhinorrhea, nasal congestion, and cough. No SOB or chest pain.  Taking oxycodone for kidney stones; mostly only taking at bedtime. Still having pain that is R sided/flank. She's having surgery at the end of May. No hematuria. Pain tolerable right now.  Social History: never smoker   ROS: All other systems reviewed and are negative.  Past Medical History Patient Active Problem List   Diagnosis Date Noted  . Otitis media 04/30/2017  . HTN (hypertension) 10/04/2016  . 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 Insomnia 02/09/2016  . Grief 02/08/2016  . Rash 02/08/2016  . Sciatica 07/28/2015  . GERD (gastroesophageal reflux disease) 03/07/2015  . Hx of colonic polyps 03/21/2014  . Hematometra 12/04/2012  . Chronic generalized pain 09/11/2012  . Carpal tunnel syndrome, bilateral 06/10/2012  . Generalized anxiety disorder 05/16/2011  . Dysphagia 04/18/2011  . Migraine 04/08/2011  . SYSTOLIC MURMUR 78/29/5621  . Depression 10/17/2010  . Sickle-cell trait ([place]) 09/28/2009    Medications- reviewed and updated Current Outpatient Prescriptions  Medication Sig Dispense Refill  . acetaminophen (TYLENOL) 500 MG tablet Take 500 mg by mouth every 6 (six) hours as needed.    Marland Kitchen amoxicillin (AMOXIL) 500 MG capsule Take 1 capsule (500 mg total) by mouth 2 (two) times daily. 14 capsule 0  . gabapentin (NEURONTIN) 600 MG tablet TAKE 1 TABLET BY MOUTH 3 TIMES DAILY. 90 tablet 0  . hydrocortisone cream 1 % Apply 1 application topically 2 (two) times daily. (Patient not taking: Reported on 04/13/2017)  30 g 0  . ondansetron (ZOFRAN ODT) 8 MG disintegrating tablet Take 1 tablet (8 mg total) by mouth every 8 (eight) hours as needed for nausea or vomiting. 15 tablet 0  . oxyCODONE-acetaminophen (PERCOCET/ROXICET) 5-325 MG tablet Take 1-2 tablets by mouth every 4 (four) hours as needed for severe pain. 20 tablet 0  . tamsulosin (FLOMAX) 0.4 MG CAPS capsule Take 1 capsule (0.4 mg total) by mouth daily. 5 capsule 0  . triamcinolone cream (KENALOG) 0.1 % Apply 1 application topically 2 (two) times daily. (Patient not taking: Reported on 01/05/2017) 30 g 0  . VOLTAREN 1 % GEL APPLY TOPICALLY 4 TIMES A DAY AS NEEDED (Patient not taking: Reported on 01/05/2017) 1 Tube 3   No current facility-administered medications for this visit.     Objective: Office vital signs reviewed. BP 120/82   Pulse 67   Temp 97.8 F (36.6 C) (Oral)   Wt 165 lb (74.8 kg)   LMP 08/15/2011   BMI 25.09 kg/m    Physical Examination:  General: Awake, alert,  Well- nourished, NAD ENMT:  L TM intact, normal light reflex, no erythema, no bulging. R TM occluded, after irritation canal irritated and erythematous. Erythematous TM with bulging and poor visualization of landmarks.  Nasal turbinates moist. MMM, Oropharynx clear without erythema or tonsillar exudate/hypertrophy Eyes: Conjunctiva non-injected.   Cardio: RRR, no m/r/g noted.  Pulm: No increased WOB.  CTAB, without wheezes, rhonchi or crackles noted.   Assessment/Plan: Otitis media Exam consistent with otitis media on the  right. Her cerumen impaction most likely contributed to this. Overall pt is well appearing.  - amoxicillin BID x 7 days - strict return precautions discussed.    No orders of the defined types were placed in this encounter.   Meds ordered this encounter  Medications  . DISCONTD: amoxicillin (AMOXIL) 500 MG capsule    Sig: Take 1 capsule (500 mg total) by mouth 3 (three) times daily.    Dispense:  14 capsule    Refill:  0  . amoxicillin  (AMOXIL) 500 MG capsule    Sig: Take 1 capsule (500 mg total) by mouth 2 (two) times daily.    Dispense:  14 capsule    Refill:  [facility] PGY-3, [place]

## 2017-05-02 ENCOUNTER — Ambulatory Visit (INDEPENDENT_AMBULATORY_CARE_PROVIDER_SITE_OTHER): Payer: Medicare Other | Admitting: Psychology

## 2017-05-02 DIAGNOSIS — F329 Major depressive disorder, single episode, unspecified: Secondary | ICD-10-CM

## 2017-05-02 DIAGNOSIS — F32A Depression, unspecified: Secondary | ICD-10-CM
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
## 2017-05-12 ENCOUNTER — Ambulatory Visit (INDEPENDENT_AMBULATORY_CARE_PROVIDER_SITE_OTHER): Payer: Medicare Other | Admitting: Psychology

## 2017-05-12 ENCOUNTER — Encounter (HOSPITAL_BASED_OUTPATIENT_CLINIC_OR_DEPARTMENT_OTHER): Payer: Self-pay | Admitting: *Deleted

## 2017-05-12 DIAGNOSIS — F32A Depression, unspecified: Secondary | ICD-10-CM

## 2017-05-12 DIAGNOSIS — F329 Major depressive disorder, single episode, unspecified: Secondary | ICD-10-CM

## 2017-05-12 NOTE — Patient Instructions (Addendum)
It was great to see you Megan Kemp!  Please call [facility] for additional details about your upcoming surgery: [phone]  I will see you in 2 weeks for our next appointment. Goodluck with your surgery!  [address]
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
## 2017-05-12 NOTE — Progress Notes (Signed)
Reason for follow-up:  Anger, interpersonal difficulties  Issues discussed:  Patient's friend died recently from suicide or overdose. Patient expressed sorrow but feels she is appropriately coping. Patient having more interaction with her son who has been treating her better.  Identified goals:  Patient given number for [facility] and [facility] to give her cousin who is seeking services. She will call [facility] to confirm surgery details. Patient will return in 2 weeks.

## 2017-05-13 ENCOUNTER — Encounter (HOSPITAL_BASED_OUTPATIENT_CLINIC_OR_DEPARTMENT_OTHER): Payer: Self-pay | Admitting: *Deleted

## 2017-05-13 NOTE — Progress Notes (Signed)
NPO AFTER MN.  ARRIVE AT 0945.  NEEDS ISTAT.  ASK MDA ABOUT EKG.  WILL TAKE GABAPENTIN AM DOS W/ SIPS OF WATER.  PT IS FOLLOWED BY PSYCHIATRIST AT [facility] FOR PARANOID BEHAVIOR BUT DX.

## 2017-05-22 ENCOUNTER — Ambulatory Visit (HOSPITAL_BASED_OUTPATIENT_CLINIC_OR_DEPARTMENT_OTHER): Payer: Medicare Other | Admitting: Anesthesiology

## 2017-05-22 ENCOUNTER — Encounter (HOSPITAL_BASED_OUTPATIENT_CLINIC_OR_DEPARTMENT_OTHER)
Admission: RE | Disposition: A | Discharge status: Home / Self Care | Payer: Self-pay | Source: Ambulatory Visit | Attending: Urology

## 2017-05-22 ENCOUNTER — Encounter (HOSPITAL_BASED_OUTPATIENT_CLINIC_OR_DEPARTMENT_OTHER): Payer: Self-pay

## 2017-05-22 ENCOUNTER — Ambulatory Visit (HOSPITAL_BASED_OUTPATIENT_CLINIC_OR_DEPARTMENT_OTHER)
Admission: RE | Admit: 2017-05-22 | Discharge: 2017-05-22 | Disposition: A | Discharge status: Home / Self Care | Payer: Medicare Other | Source: Ambulatory Visit | Attending: Urology | Admitting: Urology

## 2017-05-22 DIAGNOSIS — F410 Panic disorder [episodic paroxysmal anxiety] without agoraphobia: Secondary | ICD-10-CM | POA: Insufficient documentation

## 2017-05-22 DIAGNOSIS — F329 Major depressive disorder, single episode, unspecified: Secondary | ICD-10-CM | POA: Diagnosis not present

## 2017-05-22 DIAGNOSIS — Z801 Family history of malignant neoplasm of trachea, bronchus and lung: Secondary | ICD-10-CM | POA: Insufficient documentation

## 2017-05-22 DIAGNOSIS — D573 Sickle-cell trait: Secondary | ICD-10-CM | POA: Insufficient documentation

## 2017-05-22 DIAGNOSIS — G894 Chronic pain syndrome: Secondary | ICD-10-CM | POA: Insufficient documentation

## 2017-05-22 DIAGNOSIS — Z8249 Family history of ischemic heart disease and other diseases of the circulatory system: Secondary | ICD-10-CM | POA: Diagnosis not present

## 2017-05-22 DIAGNOSIS — K219 Gastro-esophageal reflux disease without esophagitis: Secondary | ICD-10-CM | POA: Insufficient documentation

## 2017-05-22 DIAGNOSIS — G4730 Sleep apnea, unspecified: Secondary | ICD-10-CM | POA: Insufficient documentation

## 2017-05-22 DIAGNOSIS — I10 Essential (primary) hypertension: Secondary | ICD-10-CM | POA: Diagnosis not present

## 2017-05-22 DIAGNOSIS — N201 Calculus of ureter: Secondary | ICD-10-CM

## 2017-05-22 DIAGNOSIS — F419 Anxiety disorder, unspecified: Secondary | ICD-10-CM | POA: Insufficient documentation

## 2017-05-22 DIAGNOSIS — Z833 Family history of diabetes mellitus: Secondary | ICD-10-CM | POA: Diagnosis not present

## 2017-05-22 DIAGNOSIS — M1990 Unspecified osteoarthritis, unspecified site: Secondary | ICD-10-CM | POA: Insufficient documentation

## 2017-05-22 HISTORY — DX: Unspecified osteoarthritis, unspecified site: M19.90

## 2017-05-22 HISTORY — DX: Urgency of urination: R39.15

## 2017-05-22 HISTORY — PX: CYSTOSCOPY/RETROGRADE/URETEROSCOPY/STONE EXTRACTION WITH BASKET: SHX5317

## 2017-05-22 HISTORY — DX: Personal history of adenomatous and serrated colon polyps: Z86.0101

## 2017-05-22 HISTORY — DX: Generalized anxiety disorder: F41.1

## 2017-05-22 HISTORY — DX: Calculus of ureter: N20.1

## 2017-05-22 HISTORY — DX: Personal history of other benign neoplasm: Z86.018

## 2017-05-22 HISTORY — DX: Presence of dental prosthetic device (complete) (partial): Z97.2

## 2017-05-22 HISTORY — DX: Personal history of traumatic brain injury: Z87.820

## 2017-05-22 HISTORY — DX: Blindness, one eye, unspecified eye: H54.40

## 2017-05-22 HISTORY — DX: Frequency of micturition: R35.0

## 2017-05-22 HISTORY — DX: Presence of dental prosthetic device (complete) (partial): K08.109

## 2017-05-22 HISTORY — DX: Essential (primary) hypertension: I10

## 2017-05-22 HISTORY — DX: Hematuria, unspecified: R31.9

## 2017-05-22 HISTORY — DX: Personal history of colonic polyps: Z86.010

## 2017-05-22 HISTORY — DX: Presence of spectacles and contact lenses: Z97.3

## 2017-05-22 HISTORY — DX: Personal history of other mental and behavioral disorders: Z86.59

## 2017-05-22 HISTORY — DX: Agoraphobia, unspecified: F40.00

## 2017-05-22 LAB — POCT I-STAT 4, (NA,K, GLUC, HGB,HCT)
Glucose, Bld: 94 mg/dL (ref 65–99)
HEMATOCRIT: 39 % (ref 36.0–46.0)
HEMOGLOBIN: 13.3 g/dL (ref 12.0–15.0)
POTASSIUM: 3.9 mmol/L (ref 3.5–5.1)
SODIUM: 143 mmol/L (ref 135–145)

## 2017-05-22 SURGERY — CYSTOSCOPY, WITH CALCULUS REMOVAL USING BASKET
Anesthesia: General | Laterality: Right

## 2017-05-22 MED ORDER — LACTATED RINGERS IV SOLN
INTRAVENOUS | Status: DC
  Administered 2017-05-22 (×2): via INTRAVENOUS
  Filled 2017-05-22: qty 1000

## 2017-05-22 MED ORDER — SUCCINYLCHOLINE CHLORIDE 200 MG/10ML IV SOSY
PREFILLED_SYRINGE | INTRAVENOUS | Status: AC
  Filled 2017-05-22: qty 10

## 2017-05-22 MED ORDER — OXYCODONE HCL 5 MG PO TABS
5.0000 mg | ORAL_TABLET | Freq: Once | ORAL | Status: DC | PRN
Start: 2017-05-22 — End: 2017-05-22
  Filled 2017-05-22: qty 1

## 2017-05-22 MED ORDER — PROPOFOL 500 MG/50ML IV EMUL
INTRAVENOUS | Status: AC
  Filled 2017-05-22: qty 50

## 2017-05-22 MED ORDER — LIDOCAINE 2% (20 MG/ML) 5 ML SYRINGE
INTRAMUSCULAR | Status: DC | PRN
  Administered 2017-05-22: 60 mg via INTRAVENOUS

## 2017-05-22 MED ORDER — PHENAZOPYRIDINE HCL 200 MG PO TABS: 200.0000 mg | ORAL_TABLET | Freq: Three times a day (TID) | ORAL | 0 refills | Status: DC | PRN

## 2017-05-22 MED ORDER — SUGAMMADEX SODIUM 200 MG/2ML IV SOLN
INTRAVENOUS | Status: AC
  Filled 2017-05-22: qty 4

## 2017-05-22 MED ORDER — PROPOFOL 10 MG/ML IV BOLUS
INTRAVENOUS | Status: DC | PRN
  Administered 2017-05-22: 200 mg via INTRAVENOUS

## 2017-05-22 MED ORDER — MIDAZOLAM HCL 5 MG/5ML IJ SOLN
INTRAMUSCULAR | Status: DC | PRN
  Administered 2017-05-22: 2 mg via INTRAVENOUS

## 2017-05-22 MED ORDER — KETOROLAC TROMETHAMINE 30 MG/ML IJ SOLN
INTRAMUSCULAR | Status: AC
  Filled 2017-05-22: qty 1

## 2017-05-22 MED ORDER — MIDAZOLAM HCL 2 MG/2ML IJ SOLN
INTRAMUSCULAR | Status: AC
  Filled 2017-05-22: qty 2

## 2017-05-22 MED ORDER — EPHEDRINE SULFATE-NACL 50-0.9 MG/10ML-% IV SOSY
PREFILLED_SYRINGE | INTRAVENOUS | Status: DC | PRN
  Administered 2017-05-22 (×2): 10 mg via INTRAVENOUS

## 2017-05-22 MED ORDER — FENTANYL CITRATE (PF) 100 MCG/2ML IJ SOLN
INTRAMUSCULAR | Status: AC
  Filled 2017-05-22: qty 2

## 2017-05-22 MED ORDER — DEXAMETHASONE SODIUM PHOSPHATE 4 MG/ML IJ SOLN
INTRAMUSCULAR | Status: DC | PRN
  Administered 2017-05-22: 10 mg via INTRAVENOUS

## 2017-05-22 MED ORDER — CEFAZOLIN SODIUM-DEXTROSE 2-4 GM/100ML-% IV SOLN
INTRAVENOUS | Status: AC
  Filled 2017-05-22: qty 100

## 2017-05-22 MED ORDER — KETOROLAC TROMETHAMINE 30 MG/ML IJ SOLN
INTRAMUSCULAR | Status: DC | PRN
  Administered 2017-05-22: 30 mg via INTRAVENOUS

## 2017-05-22 MED ORDER — FENTANYL CITRATE (PF) 100 MCG/2ML IJ SOLN
INTRAMUSCULAR | Status: DC | PRN
  Administered 2017-05-22: 25 ug via INTRAVENOUS
  Administered 2017-05-22: 50 ug via INTRAVENOUS

## 2017-05-22 MED ORDER — ONDANSETRON HCL 4 MG/2ML IJ SOLN
INTRAMUSCULAR | Status: DC | PRN
  Administered 2017-05-22: 4 mg via INTRAVENOUS

## 2017-05-22 MED ORDER — PROPOFOL 10 MG/ML IV BOLUS
INTRAVENOUS | Status: AC
  Filled 2017-05-22: qty 40

## 2017-05-22 MED ORDER — SCOPOLAMINE 1 MG/3DAYS TD PT72
1.0000 | MEDICATED_PATCH | TRANSDERMAL | Status: DC
Start: 2017-05-22 — End: 2017-05-22
  Administered 2017-05-22: 1 via TRANSDERMAL
  Administered 2017-05-22: 1.5 mg via TRANSDERMAL
  Filled 2017-05-22: qty 1

## 2017-05-22 MED ORDER — LIDOCAINE 2% (20 MG/ML) 5 ML SYRINGE
INTRAMUSCULAR | Status: AC
  Filled 2017-05-22: qty 15

## 2017-05-22 MED ORDER — LACTATED RINGERS IV SOLN
INTRAVENOUS | Status: DC
  Administered 2017-05-22: 14:00:00 via INTRAVENOUS
  Filled 2017-05-22: qty 1000

## 2017-05-22 MED ORDER — ONDANSETRON HCL 4 MG/2ML IJ SOLN
INTRAMUSCULAR | Status: AC
  Filled 2017-05-22: qty 2

## 2017-05-22 MED ORDER — MEPERIDINE HCL 25 MG/ML IJ SOLN
6.2500 mg | INTRAMUSCULAR | Status: DC | PRN
Start: 2017-05-22 — End: 2017-05-22
  Filled 2017-05-22: qty 1

## 2017-05-22 MED ORDER — BELLADONNA ALKALOIDS-OPIUM 16.2-60 MG RE SUPP
RECTAL | Status: AC
  Filled 2017-05-22: qty 1

## 2017-05-22 MED ORDER — FENTANYL CITRATE (PF) 100 MCG/2ML IJ SOLN
25.0000 ug | INTRAMUSCULAR | Status: DC | PRN
  Administered 2017-05-22: 25 ug via INTRAVENOUS
  Filled 2017-05-22: qty 1

## 2017-05-22 MED ORDER — SCOPOLAMINE 1 MG/3DAYS TD PT72
MEDICATED_PATCH | TRANSDERMAL | Status: AC
  Filled 2017-05-22: qty 1

## 2017-05-22 MED ORDER — FLUORESCEIN SODIUM 10 % IV SOLN
INTRAVENOUS | Status: AC
  Filled 2017-05-22: qty 5

## 2017-05-22 MED ORDER — PROMETHAZINE HCL 25 MG/ML IJ SOLN
6.2500 mg | INTRAMUSCULAR | Status: DC | PRN
  Filled 2017-05-22: qty 1

## 2017-05-22 MED ORDER — ACETAMINOPHEN 10 MG/ML IV SOLN
INTRAVENOUS | Status: DC | PRN
  Administered 2017-05-22: 1000 mg via INTRAVENOUS

## 2017-05-22 MED ORDER — CEFAZOLIN SODIUM-DEXTROSE 2-4 GM/100ML-% IV SOLN
2.0000 g | INTRAVENOUS | Status: AC
  Administered 2017-05-22: 2 g via INTRAVENOUS
  Filled 2017-05-22: qty 100

## 2017-05-22 MED ORDER — OXYCODONE HCL 5 MG/5ML PO SOLN
5.0000 mg | Freq: Once | ORAL | Status: DC | PRN
  Filled 2017-05-22: qty 5

## 2017-05-22 MED ORDER — LIDOCAINE 2% (20 MG/ML) 5 ML SYRINGE
INTRAMUSCULAR | Status: AC
  Filled 2017-05-22: qty 5

## 2017-05-22 SURGICAL SUPPLY — 37 items
BAG DRAIN URO-CYSTO SKYTR STRL (DRAIN) ×3 IMPLANT
BAG DRN UROCATH (DRAIN) ×1
BASKET LASER NITINOL 1.9FR (BASKET) IMPLANT
BASKET STNLS GEMINI 4WIRE 3FR (BASKET) IMPLANT
BASKET ZERO TIP NITINOL 2.4FR (BASKET) IMPLANT
BOOTIES KNEE HIGH SLOAN (MISCELLANEOUS) ×3 IMPLANT
BSKT STON RTRVL 120 1.9FR (BASKET)
BSKT STON RTRVL GEM 120X11 3FR (BASKET)
BSKT STON RTRVL ZERO TP 2.4FR (BASKET)
CATH INTERMIT  6FR 70CM (CATHETERS) IMPLANT
CATH URET 5FR 28IN CONE TIP (BALLOONS)
CATH URET 5FR 28IN OPEN ENDED (CATHETERS) ×2 IMPLANT
CATH URET 5FR 70CM CONE TIP (BALLOONS) IMPLANT
CATH URET DUAL LUMEN 6-10FR 50 (CATHETERS) ×2 IMPLANT
CLOTH BEACON ORANGE TIMEOUT ST (SAFETY) ×3 IMPLANT
ELECT REM PT RETURN 9FT ADLT (ELECTROSURGICAL)
ELECTRODE REM PT RTRN 9FT ADLT (ELECTROSURGICAL) IMPLANT
GLOVE BIO SURGEON STRL SZ7.5 (GLOVE) ×3 IMPLANT
GOWN STRL REUS W/ TWL LRG LVL3 (GOWN DISPOSABLE) ×1 IMPLANT
GOWN STRL REUS W/ TWL XL LVL3 (GOWN DISPOSABLE) ×1 IMPLANT
GOWN STRL REUS W/TWL LRG LVL3 (GOWN DISPOSABLE) ×3
GOWN STRL REUS W/TWL XL LVL3 (GOWN DISPOSABLE) ×3
GUIDEWIRE 0.038 PTFE COATED (WIRE) ×2 IMPLANT
GUIDEWIRE ANG ZIPWIRE 038X150 (WIRE) IMPLANT
GUIDEWIRE STR DUAL SENSOR (WIRE) ×3 IMPLANT
IV NS IRRIG 3000ML ARTHROMATIC (IV SOLUTION) ×4 IMPLANT
IV SOD CHL 0.9% 1000ML (IV SOLUTION) ×2 IMPLANT
KIT BALLIN UROMAX 15FX10 (LABEL) IMPLANT
KIT BALLN UROMAX 15FX4 (MISCELLANEOUS) IMPLANT
KIT BALLN UROMAX 26 75X4 (MISCELLANEOUS)
KIT RM TURNOVER CYSTO AR (KITS) ×3 IMPLANT
MANIFOLD NEPTUNE II (INSTRUMENTS) ×3 IMPLANT
SET HIGH PRES BAL DIL (LABEL)
SHEATH ACCESS URETERAL 38CM (SHEATH) ×2 IMPLANT
STENT URET 6FRX24 CONTOUR (STENTS) ×2 IMPLANT
TUBE CONNECTING 12'X1/4 (SUCTIONS) ×1
TUBE CONNECTING 12X1/4 (SUCTIONS) ×1 IMPLANT

## 2017-05-22 NOTE — Transfer of Care (Signed)
Immediate Anesthesia Transfer of Care Note  Patient: Megan Kemp  Procedure(s) Performed: Procedure(s): CYSTOSCOPY/RETROGRADE/URETEROSCOPY/STONE EXTRACTION WITH BASKET/ STENT (Right)  Patient Location: PACU  Anesthesia Type:General  Level of Consciousness: awake, alert , oriented and patient cooperative  Airway & Oxygen Therapy: Patient Spontanous Breathing and Patient connected to nasal cannula oxygen  Post-op Assessment: Report given to RN and Post -op Vital signs reviewed and stable  Post vital signs: Reviewed and stable  Last Vitals:  Vitals:   05/22/17 0939  BP: 135/75  Pulse: 64  Resp: 18  Temp: 36.6 C    Last Pain:  Vitals:   05/22/17 1019  TempSrc:   PainSc: 8       Patients Stated Pain Goal: 8 (62/95/28 [id number])  Complications: No apparent anesthesia complications

## 2017-05-22 NOTE — Discharge Instructions (Signed)
CYSTOSCOPY HOME CARE INSTRUCTIONS  Activity: Rest for the remainder of the day. Do not drive or operate equipment today. You may resume normal activities in one to two days as instructed by your physician.   Meals: Drink plenty of liquids and eat light foods such as gelatin or soup this evening. You may return to a normal meal plan tomorrow.  Return to Work: You may return to work in one to two days or as instructed by your physician.  Special Instructions / Symptoms: Call your physician if any of these symptoms occur:   -persistent or heavy bleeding  -bleeding which continues after first few urination  -large blood clots that are difficult to pass  -urine stream diminishes or stops completely  -fever equal to or higher than 101 degrees Farenheit.  -cloudy urine with a strong, foul odor  -severe pain  Females should always wipe from front to back after elimination. You may feel some burning pain when you urinate. This should disappear with time. Applying moist heat to the lower abdomen or a hot tub bath may help relieve the pain.   Follow-Up / Date of Return Visit to Your Physician:  Call for an appointment to arrange follow-up.  Patient Signature:  ________________________________________________________  Nurse's Signature:  ________________________________________________________  Post Anesthesia Home Care Instructions  Activity: Get plenty of rest for the remainder of the day. A responsible individual must stay with you for 24 hours following the procedure.  For the next 24 hours, DO NOT: -Drive a car -Paediatric nurse -Drink alcoholic beverages -Take any medication unless instructed by your physician -Make any legal decisions or sign important papers.  Meals: Start with liquid foods such as gelatin or soup. Progress to regular foods as tolerated. Avoid greasy, spicy, heavy foods. If nausea and/or vomiting occur, drink only clear liquids until the nausea and/or vomiting  subsides. Call your physician if vomiting continues.  Special Instructions/Symptoms: Your throat may feel dry or sore from the anesthesia or the breathing tube placed in your throat during surgery. If this causes discomfort, gargle with warm salt water. The discomfort should disappear within 24 hours.  If you had a scopolamine patch placed behind your ear for the management of post- operative nausea and/or vomiting:  1. The medication in the patch is effective for 72 hours, after which it should be removed.  Wrap patch in a tissue and discard in the trash. Wash hands thoroughly with soap and water. 2. You may remove the patch earlier than 72 hours if you experience unpleasant side effects which may include dry mouth, dizziness or visual disturbances. 3. Avoid touching the patch. Wash your hands with soap and water after contact with the patch.   Dietary Guidelines to Help Prevent Kidney Stones Kidney stones are deposits of minerals and salts that form inside your kidneys. Your risk of developing kidney stones may be greater depending on your diet, your lifestyle, the medicines you take, and whether you have certain medical conditions. Most people can reduce their chances of developing kidney stones by following the instructions below. Depending on your overall health and the type of kidney stones you tend to develop, your dietitian may give you more specific instructions. What are tips for following this plan? Reading food labels  Choose foods with "no salt added" or "low-salt" labels. Limit your sodium intake to less than 1500 mg per day.  Choose foods with calcium for each meal and snack. Try to eat about 300 mg of calcium at each meal.  Foods that contain 200-500 mg of calcium per serving include: ? 8 oz (237 ml) of milk, fortified nondairy milk, and fortified fruit juice. ? 8 oz (237 ml) of kefir, yogurt, and soy yogurt. ? 4 oz (118 ml) of tofu. ? 1 oz of cheese. ? 1 cup (300 g) of dried  figs. ? 1 cup (91 g) of cooked broccoli. ? 1-3 oz can of sardines or mackerel.  Most people need 1000 to 1500 mg of calcium each day. Talk to your dietitian about how much calcium is recommended for you. Shopping  Buy plenty of fresh fruits and vegetables. Most people do not need to avoid fruits and vegetables, even if they contain nutrients that may contribute to kidney stones.  When shopping for convenience foods, choose: ? Whole pieces of fruit. ? Premade salads with dressing on the side. ? Low-fat fruit and yogurt smoothies.  Avoid buying frozen meals or prepared deli foods.  Look for foods with live cultures, such as yogurt and kefir. Cooking  Do not add salt to food when cooking. Place a salt shaker on the table and allow each person to add his or her own salt to taste.  Use vegetable protein, such as beans, textured vegetable protein (TVP), or tofu instead of meat in pasta, casseroles, and soups. Meal planning  Eat less salt, if told by your dietitian. To do this: ? Avoid eating processed or premade food. ? Avoid eating fast food.  Eat less animal protein, including cheese, meat, poultry, or fish, if told by your dietitian. To do this: ? Limit the number of times you have meat, poultry, fish, or cheese each week. Eat a diet free of meat at least 2 days a week. ? Eat only one serving each day of meat, poultry, fish, or seafood. ? When you prepare animal protein, cut pieces into small portion sizes. For most meat and fish, one serving is about the size of one deck of cards.  Eat at least 5 servings of fresh fruits and vegetables each day. To do this: ? Keep fruits and vegetables on hand for snacks. ? Eat 1 piece of fruit or a handful of berries with breakfast. ? Have a salad and fruit at lunch. ? Have two kinds of vegetables at dinner.  Limit foods that are high in a substance called oxalate. These include: ? Spinach. ? Rhubarb. ? Beets. ? Potato chips and french  fries. ? Nuts.  If you regularly take a diuretic medicine, make sure to eat at least 1-2 fruits or vegetables high in potassium each day. These include: ? Avocado. ? Banana. ? Orange, prune, carrot, or tomato juice. ? Baked potato. ? Cabbage. ? Beans and split peas. General instructions  Drink enough fluid to keep your urine clear or pale yellow. This is the most important thing you can do.  Talk to your health care provider and dietitian about taking daily supplements. Depending on your health and the cause of your kidney stones, you may be advised: ? Not to take supplements with vitamin C. ? To take a calcium supplement. ? To take a daily probiotic supplement. ? To take other supplements such as magnesium, fish oil, or vitamin B6.  Take all medicines and supplements as told by your health care provider.  Limit alcohol intake to no more than 1 drink a day for nonpregnant women and 2 drinks a day for men. One drink equals 12 oz of beer, 5 oz of wine, or 1 oz of  hard liquor.  Lose weight if told by your health care provider. Work with your dietitian to find strategies and an eating plan that works best for you. What foods are not recommended? Limit your intake of the following foods, or as told by your dietitian. Talk to your dietitian about specific foods you should avoid based on the type of kidney stones and your overall health. Grains Breads. Bagels. Rolls. Baked goods. Salted crackers. Cereal. Pasta. Vegetables Spinach. Rhubarb. Beets. Canned vegetables. Angie Fava. Olives. Meats and other protein foods Nuts. Nut butters. Large portions of meat, poultry, or fish. Salted or cured meats. Deli meats. Hot dogs. Sausages. Dairy Cheese. Beverages Regular soft drinks. Regular vegetable juice. Seasonings and other foods Seasoning blends with salt. Salad dressings. Canned soups. Soy sauce. Ketchup. Barbecue sauce. Canned pasta sauce. Casseroles. Pizza. Lasagna. Frozen meals. Potato  chips. [place] fries. Summary  You can reduce your risk of kidney stones by making changes to your diet.  The most important thing you can do is drink enough fluid. You should drink enough fluid to keep your urine clear or pale yellow.  Ask your health care provider or dietitian how much protein from animal sources you should eat each day, and also how much salt and calcium you should have each day. This information is not intended to replace advice given to you by your health care provider. Make sure you discuss any questions you have with your health care provider. Document Released: 04/05/2011 Document Revised: 11/19/2016 Document Reviewed: 11/19/2016 Elsevier Interactive Patient Education  2017 [employer].

## 2017-05-22 NOTE — Anesthesia Postprocedure Evaluation (Signed)
Anesthesia Post Note  Patient: NORALEE DUTKO  Procedure(s) Performed: Procedure(s) (LRB): CYSTOSCOPY/RETROGRADE/URETEROSCOPY/STONE EXTRACTION WITH BASKET/ STENT (Right)     Patient location during evaluation: PACU Anesthesia Type: General Level of consciousness: awake and alert Pain management: pain level controlled Vital Signs Assessment: post-procedure vital signs reviewed and stable Respiratory status: spontaneous breathing, nonlabored ventilation, respiratory function stable and patient connected to nasal cannula oxygen Cardiovascular status: blood pressure returned to baseline and stable Postop Assessment: no signs of nausea or vomiting Anesthetic complications: no    Last Vitals:  Vitals:   05/22/17 1315 05/22/17 1330  BP: 121/72 115/72  Pulse: 65 64  Resp: 16 15  Temp:      Last Pain:  Vitals:   05/22/17 1236  TempSrc:   PainSc: [place]

## 2017-05-22 NOTE — Anesthesia Preprocedure Evaluation (Addendum)
Anesthesia Evaluation  Patient identified by MRN, date of birth, ID band Patient awake    Reviewed: Allergy & Precautions, NPO status , Patient's Chart, lab work & pertinent test results  History of Anesthesia Complications (+) PONV and history of anesthetic complications  Airway Mallampati: II  TM Distance: >3 FB Neck ROM: Full    Dental  (+) Dental Advisory Given, Lower Dentures, Partial Upper   Pulmonary    Pulmonary exam normal        Cardiovascular hypertension,  Rhythm:Regular Rate:Normal     Neuro/Psych  Headaches, PSYCHIATRIC DISORDERS Depression Hx panic attacks  Neuromuscular disease    GI/Hepatic Neg liver ROS, GERD  Medicated,  Endo/Other  negative endocrine ROS  Renal/GU negative Renal ROS  negative genitourinary   Musculoskeletal  (+) Arthritis , Osteoarthritis,    Abdominal   Peds negative pediatric ROS (+)  Hematology  (+) Sickle cell trait ,   Anesthesia Other Findings Chronic pain syndrome  Reproductive/Obstetrics negative OB ROS                         Lab Results  Component Value Date   WBC 7.9 04/13/2017   HGB 13.3 05/22/2017   HCT 39.0 05/22/2017   MCV 88.4 04/13/2017   PLT 358 04/13/2017   Lab Results  Component Value Date   CREATININE 1.04 (H) 04/13/2017   BUN 15 04/13/2017   NA 143 05/22/2017   K 3.9 05/22/2017   CL 104 04/13/2017   CO2 25 04/13/2017   Lab Results  Component Value Date   INR 0.97 01/09/2012   12/2016 EKG: normal sinus rhythm.  Anesthesia Physical Anesthesia Plan  ASA: II  Anesthesia Plan: General   Post-op Pain Management:    Induction: Intravenous  Airway Management Planned: LMA  Additional Equipment:   Intra-op Plan:   Post-operative Plan: Extubation in OR  Informed Consent: I have reviewed the patients History and Physical, chart, labs and discussed the procedure including the risks, benefits and alternatives for  the proposed anesthesia with the patient or authorized representative who has indicated his/her understanding and acceptance.   Dental advisory given  Plan Discussed with: CRNA  Anesthesia Plan Comments:         Anesthesia Quick Evaluation

## 2017-05-22 NOTE — Anesthesia Procedure Notes (Signed)
Procedure Name: LMA Insertion Date/Time: 05/22/2017 11:40 AM Performed by: Wanita Chamberlain Pre-anesthesia Checklist: Patient identified, Timeout performed, Emergency Drugs available, Suction available and Patient being monitored Patient Re-evaluated:Patient Re-evaluated prior to inductionOxygen Delivery Method: Circle system utilized Preoxygenation: Pre-oxygenation with 100% oxygen Intubation Type: IV induction Ventilation: Mask ventilation without difficulty LMA: LMA inserted LMA Size: 4.0 Number of attempts: 1 Placement Confirmation: positive ETCO2 and breath sounds checked- equal and bilateral Tube secured with: Tape Dental Injury: Teeth and Oropharynx as per pre-operative assessment

## 2017-05-22 NOTE — H&P (Signed)
Office Visit Report     04/22/2017   --------------------------------------------------------------------------------   Megan Kemp  MRN: [id number]  PRIMARY CARE:  Garth Bigness, MD  DOB: 1961/01/10, 56 year old Female  REFERRING:  Lailynn Southgate I. Patsi Sears, MD    PROVIDER:  Jethro Bolus, M.D.    TREATING:  Anne Fu    LOCATION:  [facility] [phone]   --------------------------------------------------------------------------------   CC: I have ureteral stone.  HPI: Megan Kemp is a 56 year-old female established patient who is here for ureteral stone.  She was seen in the ER on 04/13/17 for RLQ abdominal pain associated with N/V and increased frequency. CT showed Rt sided obstructive uropathy with a 7x26mm Rt UVJ stone with moderate hydronephrosis & Rt perinephric stranding.    Returns today for f/u exam. She denies passage of a stone despite being on tamsulosin. Her pain is controlled with two doses of Percocet in the morning and evening. She continues to have intermittent RLQ abdominal and groin pain. There is also associated frequency/urgency. Denies other bothersome LUTS including absence of gross hematuria/dysuria. She denies constipation. Tolerating PO intake without issue. She is afebrile.   The problem is on the right side. She first stated noticing pain on 04/13/2017. This is her first kidney stone. She is currently having groin pain. She denies having flank pain, back pain, nausea, vomiting, fever, and chills. Pain is occuring on the right side. She has not caught a stone in her urine strainer since her symptoms began.   She has never had surgical treatment for calculi in the past.     ALLERGIES: Latex    MEDICATIONS: Tamsulosin Hcl 0.4 mg capsule, ext release 24 hr 1 capsule PO Daily  Gabapentin     GU PSH: Endometrial Ablation      PSH Notes: Toe surgery for bone spires in 2004 or 2005   NON-GU PSH: Bilateral Tubal  Ligation Cataract surgery, Right Total Hysterectomy - 2014    GU PMH: Ureteral calculus - 04/15/2017 Renal calculus    NON-GU PMH: Anemia, unspecified Anxiety Arthritis Carpal tunnel syndrome, bilateral upper limbs Depression GERD Hypertension Leiomyoma of uterus, unspecified Migraine, unspecified, not intractable, without status migrainosus Panic disorder [episodic paroxysmal anxiety] Sickle-cell trait Sleep Apnea    FAMILY HISTORY: 1 son - No Family History Bipolar Disorder - Sister Depression - Mother Diabetes - Mother Hypertension - Mother, Sister Lung Cancer - Mother Patient's mother is deceased - Mother sickle cell trait - Mother   SOCIAL HISTORY: Marital Status: Single Current Smoking Status: Patient has never smoked.   Tobacco Use Assessment Completed: Used Tobacco in last 30 days? Has never drank.  Does not drink caffeine. Patient's occupation is/was Disabled/Retired from [employer].    REVIEW OF SYSTEMS:    GU Review Female:   Patient reports get up at night to urinate. Patient denies frequent urination, hard to postpone urination, burning /pain with urination, leakage of urine, stream starts and stops, trouble starting your stream, have to strain to urinate, and currently pregnant.  Gastrointestinal (Upper):   Patient denies nausea, vomiting, and indigestion/ heartburn.  Gastrointestinal (Lower):   Patient reports diarrhea. Patient denies constipation.  Constitutional:   Patient reports night sweats and fever. Patient denies fatigue and weight loss.  Skin:   Patient denies skin rash/ lesion and itching.  Eyes:   Patient denies blurred vision and double vision.  Ears/ Nose/ Throat:   Patient reports sinus problems. Patient denies sore throat.  Hematologic/Lymphatic:  Patient denies swollen glands and easy bruising.  Cardiovascular:   Patient denies leg swelling and chest pains.  Respiratory:   Patient denies cough and shortness of breath.   Endocrine:   Patient denies excessive thirst.  Musculoskeletal:   Patient reports joint pain. Patient denies back pain.  Neurological:   Patient denies headaches and dizziness.  Psychologic:   Patient reports depression and anxiety.    VITAL SIGNS:      04/22/2017 12:02 PM  Weight 175 lb / 79.38 kg  Height 68 in / 172.72 cm  BP 151/96 mmHg  Pulse 69 /min  Temperature 98.4 F / 37 C  BMI 26.6 kg/m   MULTI-SYSTEM PHYSICAL EXAMINATION:    Constitutional: Well-nourished. No physical deformities. Normally developed. Good grooming.  Neck: Neck symmetrical, not swollen. Normal tracheal position.  Respiratory: No labored breathing, no use of accessory muscles. CTA.  Cardiovascular: Normal temperature, normal extremity pulses, no swelling, no varicosities. RRR.  Skin: No paleness, no jaundice, no cyanosis. No lesion, no ulcer, no rash.  Neurologic / Psychiatric: Oriented to time, oriented to place, oriented to person. No depression, no anxiety, no agitation.  Gastrointestinal: No mass, no tenderness, no rigidity, non obese abdomen. No flank or suprapubic tenderness.  Musculoskeletal: Spine, ribs, pelvis no bilateral tenderness. Normal gait and station of head and neck.     PAST DATA REVIEWED:  Source Of History:  Patient  Records Review:   Previous Patient Records  Urine Test Review:   Urinalysis  X-Ray Review: KUB: Reviewed Films.  C.T. Abdomen/Pelvis: Reviewed Films.     04/22/17  Urinalysis  Urine Appearance Clear   Urine Color Yellow   Urine Glucose Neg   Urine Bilirubin Neg   Urine Ketones Neg   Urine Specific Gravity 1.025   Urine Blood Neg   Urine pH 6.0   Urine Protein Neg   Urine Urobilinogen 0.2   Urine Nitrites Neg   Urine Leukocyte Esterase Neg    PROCEDURES:         KUB - 87564  A single view of the abdomen is obtained.      No obvious renal calculi are seen within the contours of the bilateral renal shadows. The anatomical expected tract of the left ureter  appears clear. The anatomical expected tract of the proximal and mid right ureter appeared clear. Within the pelvic rim there are multiple pelvic phleboliths bilaterally that could be identified as stones along the anatomical expected tract. There is a suspicious calcification where I would identify his close proximity to the right UVJ but again I am not prepared to make a definitive diagnosis via KUBs because of the numerous pelvic phlebolith. Bowel gas pattern appears within normal limits. Bladder otherwise appears free of obstruction.         Urinalysis - 81003 Dipstick Dipstick Cont'd  Color: Yellow Bilirubin: Neg  Appearance: Clear Ketones: Neg  Specific Gravity: 1.025 Blood: Neg  pH: 6.0 Protein: Neg  Glucose: Neg Urobilinogen: 0.2    Nitrites: Neg    Leukocyte Esterase: Neg    ASSESSMENT:      ICD-10 Details  1 GU:   Ureteral calculus - N20.1 Right   PLAN:            Medications Refill Meds: Oxycodone-Acetaminophen 5 mg-325 mg tablet 1 tablet PO Q 6 H   #15  0 Refill(s)            Orders Labs Urine Culture  Schedule Return Visit/Planned Activity: 1-2 Weeks - Schedule Surgery, Follow up MD          Document Letter(s):  Created for Patient: Clinical Summary         Notes:   [facility] imaging on CT was not low enough to properly identify the stone. KUB today is riddled with pelvic phlebolith making identification of an actual ureteral stone somewhat challenging but there does appear to be a moderately sized calculus where I would mark as the expected right UVJ.   We discussed the management of urinary stones. These options include observation, ureteroscopy, shockwave lithotripsy, and PCNL. We discussed which options are relevant to these particular stones. We discussed the natural history of stones as well as the complications of untreated stones and the impact on quality of life without treatment as well as with each of the above listed treatments. We also discussed  the efficacy of each treatment in its ability to clear the stone burden. With any of these management options I discussed the signs and symptoms of infection and the need for emergent treatment should these be experienced. Noting how the stone is not clearly identified but likely still present based on her presentation. She does not want to continue MET/observation based on financial reasons. I am going to recommend ureteroscopy. The procedure as well as risks and complications were discussed with the patient. These include but are not limited to infection, injury to the urinary tract i.e. ureteral disruption/evulsion, bleeding, stent discomfort, anesthetic complications, among others. She expressed understanding. All questions answered. I gave her some information pamphlets as well. We will contact her once I have discussed with Dr Patsi Sears. Appropriate f/u instructions given for new or worsening symptoms.       Signed by Anne Fu on 04/22/17 at 1:26 PM (EDT)     The information contained in this medical record document is considered private and confidential patient information. This information can only be used for the medical diagnosis and/or medical services that are being provided by the patient's selected caregivers. This information can only be distributed outside of the patient's care if the patient agrees and signs waivers of authorization for this information to be sent to an outside source or route.

## 2017-05-22 NOTE — Op Note (Signed)
Pre-operative diagnosis :  Right ureteral calculus , continued right flank pain with recent episode of gross hematuria  Postoperative diagnosis: Passed right ureteral calculus  Operation: Cystourethroscopy, right retrograde Polygram interpretation, right semirigid ureteroscopy, pyeloscopy, flexible ureteroscopy, flexible pyeloscopy. Right double-J stent (6 [place] by 24 cm with suture attachment).  Surgeon:  Chauncey Cruel. Gaynelle Arabian, MD  First assistant: None    Anesthesia:  Gen. LMA   Preparation: After appropriate preanesthesia, the patient was brought to the operative room, placed on the operating table in the dorsal supine position where general LMA anesthesia was introduced. She was then replaced in the dorsal lithotomy position where the pubis was prepped with Betadine solution and draped in usual fashion. The history was reviewed. Armband was double checked.   Review history:  HPI: Megan Kemp is a 56 year-old female established patient who is here for ureteral stone.  She was seen in the ER on 04/13/17 for RLQ abdominal pain associated with N/V and increased frequency. CT showed Rt sided obstructive uropathy with a 7x35mm Rt UVJ stone with moderate hydronephrosis & Rt perinephric stranding.    Returns today for f/u exam. She denies passage of a stone despite being on tamsulosin. Her pain is controlled with two doses of Percocet in the morning and evening. She continues to have intermittent RLQ abdominal and groin pain. There is also associated frequency/urgency. Denies other bothersome LUTS including absence of gross hematuria/dysuria. She denies constipation. Tolerating PO intake without issue. She is afebrile.   The problem is on the right side. She first stated noticing pain on 04/13/2017. This is her first kidney stone. She is currently having groin pain. She denies having flank pain, back pain, nausea, vomiting, fever, and chills. Pain is occuring on the right side. She has not caught a  stone in her urine strainer since her symptoms began.   She has never had surgical treatment for calculi in the past.    Statement of  Likelihood of Success: Excellent. TIME-OUT observed.:  Procedure: Cystourethroscopy with some being normal-appearing bladder and normal urethra. Right retrograde Polygram showed normal-appearing ureteral orifice, and normal-appearing right ureter. No stone was identified. There appeared to be a stone at the right ureteropelvic junction, however. A 0.038 guidewire was passed into the right renal pelvis and coiled. The semirigid ureteroscope was then passed through the urethra and bladder and into the right ureter. Ureteroscopy showed normal-appearing ureter. No stone was identified. The ureteroscope was passed through the ureteropelvic junction into the renal pelvis and pyeloscopy revealed no stone. The semirigid ureteroscope was then withdrawn slowly to again look at the entire ureter, and again, no stone was identified. A second guidewire was then passed and coiled in the renal pelvis using a dual lumen catheter technique. Following this, a ureteral dilating sheath was easily passed to the level of the ureteropelvic junction, and the guidewire removed. The flexible ureteroscope was then passed into the renal pelvis without difficulty, and complete pyeloscopy, and evaluation of the calyces was accomplished, with no stone identified. The ureter was again evaluated as the scope was withdrawn. Following this, a 6 [place] by 24 cm double-J stent was placed over the safety wire, with the suture left attached. The patient tolerated procedure well. She was awakened and taken to recovery room in good condition.

## 2017-05-23 ENCOUNTER — Encounter (HOSPITAL_BASED_OUTPATIENT_CLINIC_OR_DEPARTMENT_OTHER): Payer: Self-pay | Admitting: Urology

## 2017-05-26 ENCOUNTER — Ambulatory Visit: Payer: Medicare Other

## 2017-06-09 ENCOUNTER — Ambulatory Visit (INDEPENDENT_AMBULATORY_CARE_PROVIDER_SITE_OTHER): Payer: Medicare Other | Admitting: Psychology

## 2017-06-09 DIAGNOSIS — F329 Major depressive disorder, single episode, unspecified: Secondary | ICD-10-CM

## 2017-06-09 DIAGNOSIS — F32A Depression, unspecified: Secondary | ICD-10-CM

## 2017-06-09 NOTE — Patient Instructions (Signed)
It was a pleasure to see you today Megan Kemp!  We discussed: 1) What we've worked on together  -Deep Breathing  -Relaxation strategies  -Remembering god will take care of people that are bothering you, you don't need to fight them  2) Meeting with Larene Beach in 3 weeks on July 9th at 11:30am  I'm happy we were able to work together and I wish you all the best. I know you will be well and take good care of yourself, [employer]
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
## 2017-06-30 ENCOUNTER — Ambulatory Visit (INDEPENDENT_AMBULATORY_CARE_PROVIDER_SITE_OTHER): Payer: Medicare Other | Admitting: Psychology

## 2017-06-30 DIAGNOSIS — F329 Major depressive disorder, single episode, unspecified: Secondary | ICD-10-CM

## 2017-06-30 DIAGNOSIS — F32A Depression, unspecified: Secondary | ICD-10-CM

## 2017-06-30 NOTE — Assessment & Plan Note (Addendum)
Assessment/Plan/Recommendations: Megan Kemp is experiencing moderate depression (PHQ-9 score of 12) due to chronic pain and the upcoming anniversary of her mother's death. She is not sleeping well and has little social support. She does identify her church pastor as a support for her. She also enjoys helping others, including homeless people. We discussed a goal of getting more involved in her church so that she can meet new people. She was given materials regarding sleep hygiene and chronic pain management/coping skills. In specific, we discussed setting a regular bedtime and abstinence from screen use an hour before bedtime. She expressed some reluctance to follow the sleep hygiene tips. Megan Kemp was able to articulate coping thoughts to assist her in times of pain.

## 2017-06-30 NOTE — Progress Notes (Deleted)
Subjective:     Patient ID: Megan Kemp, female   DOB: [birth year], 56 y.o.   MRN: [id number]  HPI   Review of Systems     Objective:   Physical Exam     Assessment:     ***    Plan:     ***

## 2017-06-30 NOTE — Progress Notes (Signed)
Reason for follow-up: Depression and Chronic pain  Issues discussed: Depression and Chronic pain/sleep hygiene. Information was provided regarding strategies for managing pain and getting better sleep. Patient was also encouraged to reach out to her church pastor to become more involved.  Identified goals: Patient has goal to reach out to her church pastor to become more involved so that she can meet new people.

## 2017-06-30 NOTE — Patient Instructions (Signed)
Zykira was given information regarding chronic pain management/coping thoughts and sleep hygiene. She will attempt to become more involved in her church so that she can meet some new people.

## 2017-06-30 NOTE — Progress Notes (Deleted)
Subjective:     Patient ID: Megan Kemp, female   DOB: [birth year], 56 y.o.   MRN: [id number]  HPI   Review of Systems     Objective:   Physical Exam     Assessment:     ***    Plan:     ***

## 2017-07-21 ENCOUNTER — Ambulatory Visit: Payer: Medicare Other

## 2017-08-04 ENCOUNTER — Telehealth: Payer: Self-pay | Admitting: Family Medicine

## 2017-08-04 ENCOUNTER — Ambulatory Visit: Payer: Medicare Other

## 2017-08-04 NOTE — Telephone Encounter (Signed)
Handicapped form dropped off for at front desk for completion.  Verified that patient section of form has been completed.  Last DOS/WCC with PCP was 04-30-17.  Placed form in team folder to be completed by clinical staff.  Roseanna Rainbow

## 2017-08-05 NOTE — Telephone Encounter (Signed)
Clinical info completed on handicap placard  form.  Place form in Dr. Rosario Jacks box for completion.  Ottis Stain, CMA

## 2017-08-07 NOTE — Telephone Encounter (Signed)
Form given to RN.

## 2017-08-07 NOTE — Telephone Encounter (Signed)
Patient informed form was ready to be picked up.

## 2017-08-11 ENCOUNTER — Telehealth: Payer: Self-pay | Admitting: Psychology

## 2017-08-11 ENCOUNTER — Ambulatory Visit: Payer: Medicare Other

## 2017-08-11 NOTE — Telephone Encounter (Signed)
Quinnie did not show up for her appointment today, so I called to check on her. She did not answer the phone so I left her a message.

## 2017-08-12 NOTE — Telephone Encounter (Signed)
Patient called requesting form be mailed to home address.  Form placed in outgoing mail.  Derl Barrow, RN

## 2017-08-27 ENCOUNTER — Telehealth: Payer: Self-pay | Admitting: Family Medicine

## 2017-08-27 NOTE — Telephone Encounter (Signed)
Clinical info completed on Disability Parking Placard form.  Place form in Dr. Rosario Jacks box for completion.  Katharina Caper, Romanda Turrubiates D, [place]

## 2017-08-27 NOTE — Telephone Encounter (Signed)
Disability parking placard form dropped off for at front desk for completion.  Verified that patient section of form has been completed.  Last DOS/WCC with PCP was 04/30/17.  Placed form in white team folder to be completed by clinical staff.  Carmina Miller

## 2017-09-02 NOTE — Telephone Encounter (Signed)
Patient advised that parking placard is completed and ready for pick up.  Derl Barrow, RN

## 2017-10-29 ENCOUNTER — Other Ambulatory Visit: Payer: Self-pay

## 2017-10-29 ENCOUNTER — Ambulatory Visit (INDEPENDENT_AMBULATORY_CARE_PROVIDER_SITE_OTHER): Payer: Medicare Other | Admitting: Family Medicine

## 2017-10-29 VITALS — BP 98/60 | HR 67 | Temp 98.0°F | Ht 68.0 in | Wt 161.0 lb

## 2017-10-29 DIAGNOSIS — L03032 Cellulitis of left toe: Secondary | ICD-10-CM | POA: Diagnosis not present

## 2017-10-29 DIAGNOSIS — M545 Low back pain, unspecified: Secondary | ICD-10-CM | POA: Insufficient documentation

## 2017-10-29 MED ORDER — CEPHALEXIN 500 MG PO CAPS: 500.0000 mg | ORAL_CAPSULE | Freq: Four times a day (QID) | ORAL | 0 refills | Status: AC

## 2017-10-29 NOTE — Assessment & Plan Note (Signed)
2 days duration, no known trauma.  No radiculopathy, releived with heating pad, has not tried tylenol or ibuprofen.   Asking for narcotic pain meds.  Told to try stretching/moderate activity/ibuprofen and continue heating pad since it alleviated pain prior

## 2017-10-29 NOTE — Progress Notes (Signed)
    Subjective:  Megan Kemp is a 56 y.o. female who presents to the [facility] today with a chief complaint of toe infection and back pain.     Objective:  Physical Exam: BP 98/60   Pulse 67   Temp 98 F (36.7 C) (Oral)   Ht 5\' 8"  (1.727 m)   Wt 161 lb (73 kg)   LMP 08/15/2011   SpO2 98%   BMI 24.48 kg/m   Gen: NAD, sitting uncomfortably in chair CV: RRR murmur appreciated Pulm: NWOB, CTAB with no crackles, wheezes, or rhonchi GI: Normal bowel sounds present. Soft, Nontender, Nondistended. MSK: no edema, cyanosis, or clubbing noted Skin: L 5th toe, swollen and slightly red, non draining although patient says it drained earlier, sensation and cap refill intact, not warm to touch but tender Neuro: grossly normal, moves all extremities Psych: Normal affect and thought content  No results found for this or any previous visit (from the past 72 hour(s)).   Assessment/Plan:  Paronychia of fifth toe of left foot Keflex 500 qidx7days, will come back in 7 days for f/u  Acute left-sided low back pain without sciatica 2 days duration, no known trauma.  No radiculopathy, releived with heating pad, has not tried tylenol or ibuprofen.   Asking for narcotic pain meds.  Told to try stretching/moderate activity/ibuprofen and continue heating pad since it alleviated pain prior   Sherene Sires, [place] - PGY1 10/29/2017 4:10 PM

## 2017-10-29 NOTE — Patient Instructions (Signed)
It was a pleasure to see you today! Thank you for choosing [facility] for your primary care. Megan Kemp was seen for R 5th toe paroncya. Come back to the clinic if you get a fever or the toe gets red/hot, and go to the emergency room if you have any life threatening symptoms.  Today we looked at your toe and prescribed an abx names keflex for you to take for 1wk.   Then you need to come back in and see [place] so we can take another look at it to make sure it is healing.  If we did any lab work today, and the results require attention, either me or my nurse will get in touch with you. If everything is normal, you will get a letter in mail and a message via . If you don't hear from [place] in two weeks, please give [place] a call. Otherwise, we look forward to seeing you again at your next visit. If you have any questions or concerns before then, please call the clinic at [phone].  Please bring all your medications to every doctors visit  Sign up for My Chart to have easy access to your labs results, and communication with your Primary care physician.    Please check-out at the front desk before leaving the clinic.    Best,  Dr. Sherene Sires FAMILY MEDICINE RESIDENT - PGY1 10/29/2017 4:03 PM

## 2017-10-29 NOTE — Assessment & Plan Note (Signed)
Keflex 500 qidx7days, will come back in 7 days for f/u

## 2017-11-05 ENCOUNTER — Encounter: Payer: Self-pay | Admitting: Family Medicine

## 2017-11-05 ENCOUNTER — Ambulatory Visit (INDEPENDENT_AMBULATORY_CARE_PROVIDER_SITE_OTHER): Payer: Medicare Other | Admitting: Family Medicine

## 2017-11-05 ENCOUNTER — Other Ambulatory Visit: Payer: Self-pay

## 2017-11-05 DIAGNOSIS — L03032 Cellulitis of left toe: Secondary | ICD-10-CM

## 2017-11-05 MED ORDER — GABAPENTIN 600 MG PO TABS: 600.0000 mg | ORAL_TABLET | Freq: Three times a day (TID) | ORAL | 0 refills | Status: DC

## 2017-11-05 MED ORDER — TRAMADOL HCL 50 MG PO TABS: 50.0000 mg | ORAL_TABLET | Freq: Three times a day (TID) | ORAL | 0 refills | Status: DC | PRN

## 2017-11-05 MED ORDER — POLYETHYLENE GLYCOL 3350 17 GM/SCOOP PO POWD: 1.0000 | ORAL | 0 refills | Status: DC

## 2017-11-05 NOTE — Patient Instructions (Signed)
Thank you for coming in today, it was so nice to see you! Today we talked about:    Left pinky toe pain: Your toe infection looks better. Continue taking the antibiotics. Continue Tylenol as needed. Only take a Tramadol pill with severe pain.   If you get a fever or the toe looks worse, come back sooner. Otherwise we would like to see you on Friday.  Please follow up on Friday. You can schedule this appointment at the front desk before you leave or call the clinic.  Bring in all your medications or supplements to each appointment for review.   If we ordered any tests today, you will be notified via telephone of any abnormalities. If everything is normal you will get a letter in the mail.   If you have any questions or concerns, please do not hesitate to call the office at [phone]. You can also message me directly via MyChart.   Sincerely,  Smitty Cords, MD

## 2017-11-05 NOTE — Assessment & Plan Note (Addendum)
Infection appears to be improving based off patient's previous description and physical exam today. Toe does not show any signs of drainable abscess or cellulitis. Patient still in significant pain however, it seems a little out of proportion to exam findings. She does have a history of chronic pain. She has percocet on her medication list and when asked if she is taking this she said "No, but can I have some more?".  -Discussed with patient that I am not willing to refill her Percocet.  -I offered her a short course of Tramadol in addition to Tylenol use  -Close follow up in 4 days to ensure pain is improving. - If patient continues to be symptomatic, may consider getting XRAY of left foot to rule out any other pathology.  - Refilled her Gabapentin and Miralax per her request

## 2017-11-05 NOTE — Progress Notes (Signed)
Subjective:    Patient ID: Megan Kemp , female   DOB: 1961/09/24 , 56 y.o..   MRN: [id number]  HPI  Megan Kemp is a 56 year old female with history of chronic pain here for  Chief Complaint  Patient presents with  . Foot Problem    left-toe    1. Follow up for paronychia of the 5th left toe: Patient is here today because she is following up for an infection on her left pinky toe.  She was last seen on November 7 at our clinic for this.  Patient notes that her toe was very swollen, red, and there was pus over the skin when she first presented.  She was given a prescription for Keflex and she notes that she has been taking this and the toe has been getting better but she is still having a significant amount of pain.  She is having difficulty ambulating with the pain and uses a cane to walk.  She has been taking Tylenol and Aleve without any relief.  She notes that she also needs a refill on her gabapentin.  She states that she has taken Percocet in the past but does not have any more refills.  Denies any fevers, chills, nausea, vomiting.  Review of Systems: Per HPI.   Past Medical History: Patient Active Problem List   Diagnosis Date Noted  . Paronychia of fifth toe of left foot 10/29/2017  . Acute left-sided low back pain without sciatica 10/29/2017  . Otitis media 04/30/2017  . HTN (hypertension) 10/04/2016  . 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 Insomnia 02/09/2016  . Grief 02/08/2016  . Rash 02/08/2016  . Sciatica 07/28/2015  . GERD (gastroesophageal reflux disease) 03/07/2015  . Hx of colonic polyps 03/21/2014  . Hematometra 12/04/2012  . Chronic generalized pain 09/11/2012  . Carpal tunnel syndrome, bilateral 06/10/2012  . Generalized anxiety disorder 05/16/2011  . Dysphagia 04/18/2011  . Migraine 04/08/2011  . SYSTOLIC MURMUR 63/78/5885  . Depression 10/17/2010  . Sickle-cell trait ([place]) 09/28/2009    Medications: reviewed   Social Hx:  reports that  has never  smoked. she has never used smokeless tobacco.   Objective:   BP 116/84   Pulse 68   Temp (!) 97.5 F (36.4 C) (Oral)   Ht 5\' 8"  (1.727 m)   Wt 162 lb 6.4 oz (73.7 kg)   LMP 08/15/2011   SpO2 99%   BMI 24.69 kg/m  Physical Exam  Gen: NAD, alert, cooperative with exam, well-appearing Left foot: Fifth digit appears slightly more hyperpigmented than the rest of her toes.  There is no erythema, fluctuance, induration of the left toe.  Significantly tender to palpation on all areas of the toe, particularly at the tip.  Range of motion normal however does endorse pain with all movements.     Assessment & Plan:  Paronychia of fifth toe of left foot Infection appears to be improving based off patient's previous description and physical exam today. Toe does not show any signs of drainable abscess or cellulitis. Patient still in significant pain however, it seems a little out of proportion to exam findings. She does have a history of chronic pain. She has percocet on her medication list and when asked if she is taking this she said "No, but can I have some more?".  -Discussed with patient that I am not willing to refill her Percocet.  -I offered her a short course of Tramadol in addition to Tylenol use  -Close follow  up in 4 days to ensure pain is improving. - If patient continues to be symptomatic, may consider getting XRAY of left foot to rule out any other pathology.  - Refilled her Gabapentin and Miralax per her request   Meds ordered this encounter  Medications  . gabapentin (NEURONTIN) 600 MG tablet    Sig: Take 1 tablet (600 mg total) 3 (three) times daily by mouth.    Dispense:  90 tablet    Refill:  0  . traMADol (ULTRAM) 50 MG tablet    Sig: Take 1 tablet (50 mg total) every 8 (eight) hours as needed by mouth.    Dispense:  10 tablet    Refill:  0  . polyethylene glycol powder (MIRALAX) powder    Sig: Take 255 g every other day by mouth.    Dispense:  255 g    Refill:  0     Smitty Cords, MD [place], PGY-3

## 2017-11-06 ENCOUNTER — Telehealth: Payer: Self-pay | Admitting: *Deleted

## 2017-11-06 MED FILL — GABAPENTIN 600 MG TABLET: 600 | 30 days supply | Qty: 90 | Fill #0

## 2017-11-06 MED FILL — POLYETHYLENE GLYCOL 3350: 10 days supply | Qty: 255 | Fill #0

## 2017-11-06 NOTE — Telephone Encounter (Signed)
Received message on nurse line from [facility] with [place] requesting clarification on rx for miralx written yesterday. Directions say take 255 g every other day. Usual dose is 17 g. Please send new script or call Raquel Sarna at [phone]. Hubbard Hartshorn, RN, BSN

## 2017-11-07 ENCOUNTER — Ambulatory Visit: Payer: Medicare Other | Admitting: Student

## 2017-11-07 MED ORDER — POLYETHYLENE GLYCOL 3350 17 GM/SCOOP PO POWD: 17.0000 g | Freq: Every day | ORAL | 0 refills | Status: DC

## 2017-11-07 NOTE — Telephone Encounter (Signed)
New correct rx sent

## 2017-11-11 ENCOUNTER — Ambulatory Visit: Payer: Medicare Other | Admitting: Student

## 2017-11-12 ENCOUNTER — Encounter: Payer: Self-pay | Admitting: Internal Medicine

## 2017-11-12 ENCOUNTER — Ambulatory Visit (INDEPENDENT_AMBULATORY_CARE_PROVIDER_SITE_OTHER): Payer: Medicare Other | Admitting: Internal Medicine

## 2017-11-12 ENCOUNTER — Other Ambulatory Visit: Payer: Self-pay

## 2017-11-12 VITALS — BP 114/72 | HR 73 | Temp 98.3°F | Ht 68.0 in | Wt 163.8 lb

## 2017-11-12 DIAGNOSIS — M79676 Pain in unspecified toe(s): Secondary | ICD-10-CM

## 2017-11-12 DIAGNOSIS — L03032 Cellulitis of left toe: Secondary | ICD-10-CM | POA: Diagnosis not present

## 2017-11-12 MED ORDER — IBUPROFEN 600 MG PO TABS: 600.0000 mg | ORAL_TABLET | Freq: Three times a day (TID) | ORAL | 0 refills | Status: DC | PRN

## 2017-11-12 MED ORDER — TRAMADOL HCL 50 MG PO TABS: 50.0000 mg | ORAL_TABLET | Freq: Three times a day (TID) | ORAL | 0 refills | Status: DC | PRN

## 2017-11-12 NOTE — Assessment & Plan Note (Signed)
-   Patient still with pain but no drainable area. Left toe is slightly discolored and swollen but ROM intact. Patient does not recall injury, so fracture less likely. No history fever or diagnosis of diabetes to suggest bone infection.  - Recommended regular foot soaks in warm water with epsom salts (at least twice a day if able) - Will order left foot xray, as would have expected pain and swelling to have improved more by this point - Refilled tramadol (#15) with counseling that this would not be a longterm medication and that patient should use tylenol and ibuprofen regularly first. Provided rx for ibuprofen and advised patient to take with food. - Will refer to Podiatry for second opinion and treatment of jagged toenails

## 2017-11-12 NOTE — Progress Notes (Signed)
[facility] Progress Note  Subjective:  Megan Kemp is a 56 y.o. female with history of chronic pain, HTN, and housing insecurity who presents for continued pain of left fifth toe. Symptoms began about 1 month ago. She does not recall injuring the area. Initially it did drain some pus but none recently. Swelling improved only a little after completing a course of keflex. She denies fevers or decreased appetite. She describes the pain as "aching like a toothache." Tramadol has helped the pain, but she only has 2 pills left. She has not been able to regularly soak her foot due to having to stay at a friend's house recently. She does not have a prescription for ibuprofen, so she has not been taking that or tylenol. She had to cancel a trip to [place] due to the pain. She uses a pain chronically.    Allergies  Allergen Reactions  . Latex Itching  . Codeine Rash    Pt is able to take percocet & vicodin with no problems.    Social History   Tobacco Use  . Smoking status: Never Smoker  . Smokeless tobacco: Never Used  Substance Use Topics  . Alcohol use: No    Objective: Blood pressure 114/72, pulse 73, temperature 98.3 F (36.8 C), temperature source Oral, height 5\' 8"  (1.727 m), weight 163 lb 12.8 oz (74.3 kg), last menstrual period 08/15/2011, SpO2 97 %. Body mass index is 24.91 kg/m. Constitutional: Thin female, in NAD CV: Palpable DP and PT pulses.  Musculoskeletal: No LE edema Skin: Hyperpigmentation of left 5th toe with mild swelling. No discrete area of fluctuance. TTP but can tolerate exam. Able to move toe. Thickened nails. Feet warm and dry.  Psychiatric: Normal mood and affect.  Vitals reviewed  Assessment/Plan: Paronychia of fifth toe of left foot - Patient still with pain but no drainable area. Left toe is slightly discolored and swollen but ROM intact. Patient does not recall injury, so fracture less likely. No history fever or diagnosis of diabetes  to suggest bone infection.  - Recommended regular foot soaks in warm water with epsom salts (at least twice a day if able) - Will order left foot xray, as would have expected pain and swelling to have improved more by this point - Refilled tramadol (#15) with counseling that this would not be a longterm medication and that patient should use tylenol and ibuprofen regularly first. Provided rx for ibuprofen and advised patient to take with food. - Will refer to Podiatry for second opinion and treatment of jagged toenails  Follow-up prn if symptoms do not improve.  Olene Floss, MD [place], PGY-3

## 2017-11-12 NOTE — Patient Instructions (Signed)
Megan Kemp,  I'm sorry your toe is still bothering you. It is a good sign you don't have a fever.  I would encourage you to soak your foot in warm water at least twice a day to improve swelling.  I will refer you to Podiatry (foot doctor). You should get a call about this next week.  I have ordered a left foot x-ray. You can get this done during business hours at your convenience.  Best, Dr. Ola Spurr

## 2017-11-25 ENCOUNTER — Emergency Department (HOSPITAL_COMMUNITY)
Admission: EM | Admit: 2017-11-25 | Discharge: 2017-11-26 | Disposition: A | Discharge status: Home / Self Care | Payer: Medicare Other | Attending: Emergency Medicine | Admitting: Emergency Medicine

## 2017-11-25 ENCOUNTER — Encounter (HOSPITAL_COMMUNITY): Payer: Self-pay | Admitting: Emergency Medicine

## 2017-11-25 ENCOUNTER — Other Ambulatory Visit: Payer: Self-pay

## 2017-11-25 DIAGNOSIS — H5462 Unqualified visual loss, left eye, normal vision right eye: Secondary | ICD-10-CM | POA: Diagnosis not present

## 2017-11-25 DIAGNOSIS — Z79899 Other long term (current) drug therapy: Secondary | ICD-10-CM | POA: Insufficient documentation

## 2017-11-25 DIAGNOSIS — Z91040 Latex allergy status: Secondary | ICD-10-CM | POA: Insufficient documentation

## 2017-11-25 DIAGNOSIS — R0602 Shortness of breath: Secondary | ICD-10-CM | POA: Diagnosis not present

## 2017-11-25 DIAGNOSIS — R197 Diarrhea, unspecified: Secondary | ICD-10-CM | POA: Diagnosis not present

## 2017-11-25 DIAGNOSIS — I10 Essential (primary) hypertension: Secondary | ICD-10-CM | POA: Diagnosis not present

## 2017-11-25 DIAGNOSIS — R42 Dizziness and giddiness: Secondary | ICD-10-CM | POA: Diagnosis present

## 2017-11-25 LAB — URINALYSIS, ROUTINE W REFLEX MICROSCOPIC
BILIRUBIN URINE: NEGATIVE
Glucose, UA: NEGATIVE mg/dL
Ketones, ur: NEGATIVE mg/dL
LEUKOCYTES UA: NEGATIVE
Nitrite: NEGATIVE
PH: 6 (ref 5.0–8.0)
Protein, ur: 30 mg/dL — AB
SPECIFIC GRAVITY, URINE: 1.008 (ref 1.005–1.030)

## 2017-11-25 LAB — CBC
HEMATOCRIT: 40 % (ref 36.0–46.0)
Hemoglobin: 13.2 g/dL (ref 12.0–15.0)
MCH: 30.2 pg (ref 26.0–34.0)
MCHC: 33 g/dL (ref 30.0–36.0)
MCV: 91.5 fL (ref 78.0–100.0)
PLATELETS: 365 10*3/uL (ref 150–400)
RBC: 4.37 MIL/uL (ref 3.87–5.11)
RDW: 14.4 % (ref 11.5–15.5)
WBC: 5 10*3/uL (ref 4.0–10.5)

## 2017-11-25 LAB — BASIC METABOLIC PANEL
Anion gap: 7 (ref 5–15)
BUN: 10 mg/dL (ref 6–20)
CHLORIDE: 102 mmol/L (ref 101–111)
CO2: 29 mmol/L (ref 22–32)
CREATININE: 0.84 mg/dL (ref 0.44–1.00)
Calcium: 9.5 mg/dL (ref 8.9–10.3)
GFR calc Af Amer: >60 mL/min (ref >60)
GFR calc non Af Amer: >60 mL/min (ref >60)
GLUCOSE: 108 mg/dL — AB (ref 65–99)
POTASSIUM: 3.7 mmol/L (ref 3.5–5.1)
SODIUM: 138 mmol/L (ref 135–145)

## 2017-11-25 LAB — COOXEMETRY PANEL
CARBOXYHEMOGLOBIN: 0.8 % (ref 0.5–1.5)
METHEMOGLOBIN: 0.8 % (ref 0.0–1.5)
O2 SAT: 52 %
TOTAL HEMOGLOBIN: 13.4 g/dL (ref 12.0–16.0)

## 2017-11-25 MED ORDER — MECLIZINE HCL 25 MG PO TABS: 25.0000 mg | ORAL_TABLET | Freq: Three times a day (TID) | ORAL | 0 refills | Status: DC | PRN

## 2017-11-25 MED ORDER — MECLIZINE HCL 25 MG PO TABS
25.0000 mg | ORAL_TABLET | Freq: Once | ORAL | Status: AC
  Administered 2017-11-25: 25 mg via ORAL
  Filled 2017-11-25: qty 1

## 2017-11-25 MED ORDER — KETOROLAC TROMETHAMINE 30 MG/ML IJ SOLN
15.0000 mg | Freq: Once | INTRAMUSCULAR | Status: AC
  Administered 2017-11-25: 15 mg via INTRAVENOUS
  Filled 2017-11-25: qty 1

## 2017-11-25 MED ORDER — PROMETHAZINE HCL 25 MG/ML IJ SOLN
12.5000 mg | Freq: Once | INTRAMUSCULAR | Status: AC
  Administered 2017-11-25: 12.5 mg via INTRAVENOUS
  Filled 2017-11-25 (×2): qty 1

## 2017-11-25 NOTE — Discharge Instructions (Signed)
The dizziness may be a vertigo.  Take the Antivert as needed to help with the symptoms.  Try and avoid gas fumes.
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
## 2017-11-25 NOTE — ED Notes (Signed)
Called pharmacy to follow up Phenergan, this Nurse was told they tubed it earlier but will send in  tube another one.

## 2017-11-26 NOTE — ED Notes (Addendum)
Pt awaiting son to arrive.  Verbalized understanding of discharge instructions.

## 2017-12-02 ENCOUNTER — Ambulatory Visit: Payer: Medicare Other | Admitting: Student

## 2017-12-04 ENCOUNTER — Ambulatory Visit (INDEPENDENT_AMBULATORY_CARE_PROVIDER_SITE_OTHER): Payer: Medicare Other | Admitting: Internal Medicine

## 2017-12-04 ENCOUNTER — Other Ambulatory Visit: Payer: Self-pay | Admitting: Family Medicine

## 2017-12-04 ENCOUNTER — Encounter: Payer: Self-pay | Admitting: Internal Medicine

## 2017-12-04 ENCOUNTER — Other Ambulatory Visit: Payer: Self-pay

## 2017-12-04 DIAGNOSIS — Z1231 Encounter for screening mammogram for malignant neoplasm of breast: Secondary | ICD-10-CM

## 2017-12-04 DIAGNOSIS — R42 Dizziness and giddiness: Secondary | ICD-10-CM

## 2017-12-04 MED ORDER — MECLIZINE HCL 25 MG PO TABS: 25.0000 mg | ORAL_TABLET | Freq: Three times a day (TID) | ORAL | 0 refills | Status: DC | PRN

## 2017-12-04 NOTE — Patient Instructions (Addendum)
It was nice meeting you today Ms. Paulhus!  I have sent the dizziness medication (Antivert) to the [facility] for you. Only take this as needed. If the dizziness continues, please let us or your neurologist know.   You can go to [facility] whenever it is convenient for you to have your foot xray performed.   A referral has been placed for you to a podiatrist at [facility]. If you would like to call them to try to schedule a sooner appointment, their phone number is [phone].   If you have any questions or concerns, please feel free to call the clinic.   Be well,  Dr. Avon Gully

## 2017-12-04 NOTE — Progress Notes (Signed)
   Subjective:   Patient: Megan Kemp       Birthdate: 1960-12-28       MRN: [id number]      HPI  Megan Kemp is a 56 y.o. female presenting for ED f/u.   Patient was seen in [facility] on 12/04 for dizziness and HA. Neuro exam was normal and patient improved with Antivert, so she was discharged with diagnosis of likely vertigo and told to f/u with PCP. Three days later, patient went to [facility] ED because her symptoms persisted. 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 Reports he was admitted there from 12/7-12/10. Reports that while she was admitted, they performed various imaging studies, and started her on short course of steroids, she thinks for HA. She also has f/u scheduled with [facility] neuro on 12/31.  Patient has no complaints currently, but says she still becomes dizzy at times, especially when laying down at night. Says Antivert worked very well and she would like a refill. Also reports occasional HA but says this has been significantly improved she thinks due to taking steroids. Says she was giving Norco and Valium in the past for headaches and these worked as well.    Smoking status reviewed. Patient is never smoker.   Review of Systems See HPI.     Objective:  Physical Exam  Constitutional: She is oriented to person, place, and time and well-developed, well-nourished, and in no distress.  HENT:  Head: Normocephalic and atraumatic.  Eyes: Right eye exhibits no discharge. Left eye exhibits no discharge.  Blind on L with chronic changes to pupil. R eye PERRLA, EOMI. No nystagmus.   Pulmonary/Chest: Effort normal. No respiratory distress.  Neurological: She is alert and oriented to person, place, and time.  5/5 strength upper and lower extremities bilaterally. CN II-XII grossly intact.   Psychiatric: Affect and judgment normal.      Assessment & Plan:  Dizziness Initially diagnosed as vertigo at [facility] ED. Reportedly now diagnosed with aneurysm at [facility]  have significantly improved, though does still have some dizziness when laying down. Agreed to refill Antivert x1, but told patient it is more appropriate for her to follow up with her neurologist for this issue if it persists. Also discussed that we will not be prescribing Valium or Norco for her for headaches, as they are not indicated for that use. No neuro symptoms on physical exam today.  - F/u with [facility] neuro on 12/31 as planned - Discussed return precautions - Antivert refilled x1   Adin Hector, MD, MPH PGY-3 [facility] Pager [phone]

## 2017-12-05 ENCOUNTER — Encounter: Payer: Self-pay | Admitting: Internal Medicine

## 2017-12-05 NOTE — Assessment & Plan Note (Signed)
Initially diagnosed as vertigo at [facility] ED. Reportedly now diagnosed with aneurysm at [place] have significantly improved, though does still have some dizziness when laying down. Agreed to refill Antivert x1, but told patient it is more appropriate for her to follow up with her neurologist for this issue if it persists. Also discussed that we will not be prescribing Valium or Norco for her for headaches, as they are not indicated for that use. No neuro symptoms on physical exam today.  - F/u with [facility] neuro on 12/31 as planned - Discussed return precautions - Antivert refilled x1

## 2017-12-08 ENCOUNTER — Other Ambulatory Visit: Payer: Self-pay | Admitting: Family Medicine

## 2017-12-08 MED ORDER — LIDOCAINE 5 % EX OINT: 1.0000 "application " | TOPICAL_OINTMENT | CUTANEOUS | 0 refills | Status: DC | PRN

## 2017-12-08 NOTE — Telephone Encounter (Signed)
Received fax request for lidocaine 5% ointment refill. Per chart review, last prescribed in 2016 for fibromyalgia. Will give a one time refill as PCP is currently not in office.

## 2017-12-19 MED FILL — MECLIZINE 25 MG TABLET: 25 | 3 days supply | Qty: 10 | Fill #0

## 2018-01-13 ENCOUNTER — Ambulatory Visit
Admission: RE | Admit: 2018-01-13 | Discharge: 2018-01-13 | Disposition: A | Discharge status: Home / Self Care | Payer: Medicare Other | Source: Ambulatory Visit

## 2018-01-13 DIAGNOSIS — Z1231 Encounter for screening mammogram for malignant neoplasm of breast: Secondary | ICD-10-CM

## 2018-01-19 ENCOUNTER — Other Ambulatory Visit: Payer: Self-pay | Admitting: *Deleted

## 2018-01-20 MED ORDER — LIDOCAINE 5 % EX OINT: 1.0000 "application " | TOPICAL_OINTMENT | CUTANEOUS | 0 refills | Status: DC | PRN

## 2018-01-29 ENCOUNTER — Encounter: Payer: Self-pay | Admitting: Podiatry

## 2018-01-29 NOTE — Progress Notes (Signed)
Dg foot  

## 2018-01-30 ENCOUNTER — Telehealth: Payer: Self-pay | Admitting: Family Medicine

## 2018-01-30 NOTE — Telephone Encounter (Signed)
[facility] called regarding the lidocaine Rx that was sent over. They are saying the directions needs clarification. The only way they can fill it is if the directions says how many grams per day to be applied. It has to specify grams. Please advise

## 2018-02-02 ENCOUNTER — Other Ambulatory Visit (HOSPITAL_COMMUNITY)
Admission: RE | Admit: 2018-02-02 | Discharge: 2018-02-02 | Disposition: A | Discharge status: Home / Self Care | Payer: Medicare Other | Source: Ambulatory Visit | Attending: Family Medicine | Admitting: Family Medicine

## 2018-02-02 ENCOUNTER — Ambulatory Visit (INDEPENDENT_AMBULATORY_CARE_PROVIDER_SITE_OTHER): Payer: Medicare Other | Admitting: Family Medicine

## 2018-02-02 ENCOUNTER — Encounter: Payer: Self-pay | Admitting: Family Medicine

## 2018-02-02 ENCOUNTER — Other Ambulatory Visit: Payer: Self-pay

## 2018-02-02 VITALS — BP 120/80 | HR 71 | Temp 98.4°F | Ht 68.0 in | Wt 173.0 lb

## 2018-02-02 DIAGNOSIS — N760 Acute vaginitis: Secondary | ICD-10-CM

## 2018-02-02 DIAGNOSIS — B9689 Other specified bacterial agents as the cause of diseases classified elsewhere: Secondary | ICD-10-CM | POA: Diagnosis not present

## 2018-02-02 DIAGNOSIS — Z113 Encounter for screening for infections with a predominantly sexual mode of transmission: Secondary | ICD-10-CM | POA: Insufficient documentation

## 2018-02-02 DIAGNOSIS — Z1211 Encounter for screening for malignant neoplasm of colon: Secondary | ICD-10-CM | POA: Insufficient documentation

## 2018-02-02 LAB — POCT WET PREP (WET MOUNT)
CLUE CELLS WET PREP WHIFF POC: NEGATIVE
Trichomonas Wet Prep HPF POC: ABSENT

## 2018-02-02 MED ORDER — METRONIDAZOLE 500 MG PO TABS: 500.0000 mg | ORAL_TABLET | Freq: Two times a day (BID) | ORAL | 0 refills | Status: DC

## 2018-02-02 MED FILL — metroNIDAZOLE 500 MG TABS: 500 | 7 days supply | Qty: 14 | Fill #0

## 2018-02-02 NOTE — Assessment & Plan Note (Signed)
Confirmed by wet prep, metro 500 BIDx7days sent to pharmacy and patient notified personally

## 2018-02-02 NOTE — Patient Instructions (Signed)
It was a pleasure to see you today! Thank you for choosing [facility] for your primary care. Megan Kemp was seen for STI check. Come back to the clinic if you have any worsening symptoms, and go to the emergency room if you have any life threatening concerns.  Today we ordered a gonnorhea/chlamydia swab and a HIV/syphilis blood test.   At your request, we will call and leave the results on your phone.   Any prescriptions needed will be sent in to either the pharmacy on [address] or the [facility] outpatient pharmacy.    If we did any lab work today, and the results require attention, either me or my nurse will get in touch with you. If everything is normal, you will get a letter in mail and a message via . If you don't hear from [place] in two weeks, please give [place] a call. Otherwise, we look forward to seeing you again at your next visit. If you have any questions or concerns before then, please call the clinic at [phone].  Please bring all your medications to every doctors visit  Sign up for My Chart to have easy access to your labs results, and communication with your Primary care physician.    Please check-out at the front desk before leaving the clinic.    Best,  Dr. Sherene Sires FAMILY MEDICINE RESIDENT - PGY1 02/02/2018 10:55 AM

## 2018-02-02 NOTE — Assessment & Plan Note (Signed)
Patient celibate for >61yrs had single sexual encounter ~1 wk ago and the condom broke.  She has recently started feeling a bilateral lower pelvic pain and would like an STI workup.   She says pain is crampy and intermittent with no flank pain.   she denies itching/discharge.  She specifically denies lesions/rashes/fevers/urinary symptoms/new menstrual bleeding.   She has a hx of hysterectomy.  Plan is G/C swab, HIV/RPR

## 2018-02-02 NOTE — Progress Notes (Signed)
    Subjective:  Megan Kemp is a 57 y.o. female who presents to the [facility] today with a chief complaint of wanting STI screen.   HPI: Patient celibate for >29yrs had single sexual encounter ~1 wk ago and the condom broke.  She has recently started feeling a bilateral lower pelvic pain and would like an STI workup.   She says pain is crampy and intermittent with no flank pain.   she denies itching/discharge.  She specifically denies lesions/rashes/fevers/urinary symptoms/new menstrual bleeding.   She has a hx of hysterectomy.   Objective:  Physical Exam: BP 120/80   Pulse 71   Temp 98.4 F (36.9 C) (Oral)   Ht 5\' 8"  (1.727 m)   Wt 173 lb (78.5 kg)   LMP 08/15/2011   SpO2 98%   BMI 26.30 kg/m   Gen: NAD, conversing comfortably CV: RRR with systolic mumur 2/6 appreciated Pulm: NWOB, CTAB with no crackles, wheezes, or rhonchi GI: Normal bowel sounds present. Soft, Nontender, Nondistended. MSK: no edema, cyanosis, or clubbing noted Skin: warm, dry Neuro: grossly normal, moves all extremities Psych: Normal affect and thought content  Results for orders placed or performed in visit on 02/02/18 (from the past 72 hour(s))  POCT Wet Prep [facility])     Status: Abnormal   Collection Time: 02/02/18 10:51 AM  Result Value Ref Range   Source Wet Prep POC VAG    WBC, Wet Prep HPF POC 0-3    Bacteria Wet Prep HPF POC Moderate (A) Few   Clue Cells Wet Prep HPF POC Few (A) None   Clue Cells Wet Prep Whiff POC Negative Whiff    Yeast Wet Prep HPF POC None    Trichomonas Wet Prep HPF POC Absent Absent     Assessment/Plan:  Screening examination for STD (sexually transmitted disease) Patient celibate for >29yrs had single sexual encounter ~1 wk ago and the condom broke.  She has recently started feeling a bilateral lower pelvic pain and would like an STI workup.   She says pain is crampy and intermittent with no flank pain.   she denies itching/discharge.  She specifically denies  lesions/rashes/fevers/urinary symptoms/new menstrual bleeding.   She has a hx of hysterectomy.  Plan is G/C swab, HIV/RPR  BV (bacterial vaginosis) Confirmed by wet prep, metro 500 BIDx7days sent to pharmacy and patient notified personally   Sherene Sires,  - PGY1 02/02/2018 11:15 AM

## 2018-02-03 LAB — RPR: RPR Ser Ql: NONREACTIVE

## 2018-02-03 LAB — HIV ANTIBODY (ROUTINE TESTING W REFLEX): HIV SCREEN 4TH GENERATION: NONREACTIVE

## 2018-02-03 LAB — CERVICOVAGINAL ANCILLARY ONLY
CHLAMYDIA, DNA PROBE: NEGATIVE
NEISSERIA GONORRHEA: NEGATIVE

## 2018-02-03 MED ORDER — LIDOCAINE 5 % EX OINT: 1.0000 "application " | TOPICAL_OINTMENT | CUTANEOUS | 0 refills | Status: DC | PRN

## 2018-02-03 NOTE — Telephone Encounter (Signed)
Changed lidocaine rx.

## 2018-02-05 ENCOUNTER — Other Ambulatory Visit: Payer: Self-pay

## 2018-02-12 ENCOUNTER — Telehealth: Payer: Self-pay

## 2018-02-12 ENCOUNTER — Encounter: Payer: Medicare Other | Admitting: Podiatry

## 2018-02-12 NOTE — Telephone Encounter (Signed)
[facility] left message on nurse line stating they are faxing a detailed written prescription for patient's Lidocaine ointment that PCP just has to circle what is needed and signed.   They are sending because directions have to be specific related to how much to apply, for how long, how often and to what area. As needed is not adequate. Danley Danker, RN [facility] RN)

## 2018-02-18 ENCOUNTER — Other Ambulatory Visit: Payer: Self-pay

## 2018-02-24 NOTE — Telephone Encounter (Signed)
[facility] sent a request to clarify directions on the Lidocaine Rx. Dr. Lindell Noe filled out the form and put it in the slot to be faxed. Ottis Stain, CMA

## 2018-03-13 ENCOUNTER — Encounter: Payer: Medicare Other | Admitting: Obstetrics & Gynecology

## 2018-04-14 ENCOUNTER — Ambulatory Visit (HOSPITAL_COMMUNITY)
Admission: EM | Admit: 2018-04-14 | Discharge: 2018-04-14 | Disposition: A | Discharge status: Home / Self Care | Payer: Medicare Other | Attending: Family Medicine | Admitting: Family Medicine

## 2018-04-14 ENCOUNTER — Encounter (HOSPITAL_COMMUNITY): Payer: Self-pay | Admitting: Emergency Medicine

## 2018-04-14 ENCOUNTER — Other Ambulatory Visit: Payer: Self-pay

## 2018-04-14 DIAGNOSIS — R0789 Other chest pain: Secondary | ICD-10-CM

## 2018-04-14 DIAGNOSIS — J209 Acute bronchitis, unspecified: Secondary | ICD-10-CM

## 2018-04-14 DIAGNOSIS — S39011A Strain of muscle, fascia and tendon of abdomen, initial encounter: Secondary | ICD-10-CM

## 2018-04-14 MED ORDER — TRAMADOL HCL 50 MG PO TABS: 50.0000 mg | ORAL_TABLET | Freq: Four times a day (QID) | ORAL | 0 refills | Status: AC | PRN

## 2018-04-14 MED ORDER — HYDROCOD POLST-CPM POLST ER 10-8 MG/5ML PO SUER: 5.0000 mL | Freq: Two times a day (BID) | ORAL | 0 refills | Status: AC | PRN

## 2018-04-14 MED ORDER — AZITHROMYCIN 250 MG PO TABS: ORAL_TABLET | ORAL | 0 refills | Status: DC

## 2018-04-14 MED ORDER — KETOROLAC TROMETHAMINE 60 MG/2ML IM SOLN
60.0000 mg | Freq: Once | INTRAMUSCULAR | Status: AC
  Administered 2018-04-14: 60 mg via INTRAMUSCULAR

## 2018-04-14 MED ORDER — KETOROLAC TROMETHAMINE 60 MG/2ML IM SOLN
INTRAMUSCULAR | Status: AC
  Filled 2018-04-14: qty 2

## 2018-04-14 MED FILL — traMADol HCL 50 MG TABS: 50 | 4 days supply | Qty: 15 | Fill #0

## 2018-04-14 MED FILL — HYDROCODONE-CHLORPHEN ER SU: 10-8 | 3 days supply | Qty: 30 | Fill #0

## 2018-04-14 MED FILL — AZITHROMYCIN 250 MG TABLET: 250 | 5 days supply | Qty: 6 | Fill #0

## 2018-04-14 NOTE — ED Triage Notes (Signed)
C/o productive cough with "yellow" mucus onset three weeks, chest congestion and also c/o muscle strain with limited ROM on right side torso

## 2018-04-14 NOTE — ED Notes (Signed)
Patient thought she was getting a pain shot prior to discharge, spoke to feng, np
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
## 2018-04-15 NOTE — Telephone Encounter (Signed)
Filled 04/03/2018

## 2018-05-07 NOTE — Progress Notes (Signed)
This encounter was created in error - please disregard.

## 2018-07-03 LAB — GLUCOSE, POCT (MANUAL RESULT ENTRY): POC Glucose: 110 mg/dl — AB (ref 70–99)

## 2018-07-22 ENCOUNTER — Ambulatory Visit: Payer: Medicare Other | Admitting: Family Medicine

## 2018-12-02 ENCOUNTER — Ambulatory Visit (INDEPENDENT_AMBULATORY_CARE_PROVIDER_SITE_OTHER): Payer: Medicare Other | Admitting: Family Medicine

## 2018-12-02 ENCOUNTER — Encounter: Payer: Self-pay | Admitting: Family Medicine

## 2018-12-02 VITALS — BP 110/70 | HR 72 | Temp 98.1°F | Ht 68.0 in | Wt 177.0 lb

## 2018-12-02 DIAGNOSIS — M79643 Pain in unspecified hand: Secondary | ICD-10-CM | POA: Diagnosis not present

## 2018-12-02 DIAGNOSIS — G8929 Other chronic pain: Secondary | ICD-10-CM | POA: Diagnosis not present

## 2018-12-02 MED ORDER — DICLOFENAC SODIUM 1 % TD GEL: 2.0000 g | Freq: Four times a day (QID) | TRANSDERMAL | 2 refills | Status: DC | PRN

## 2018-12-02 MED ORDER — GABAPENTIN 300 MG PO CAPS: 300.0000 mg | ORAL_CAPSULE | Freq: Every day | ORAL | 3 refills | Status: DC

## 2018-12-02 MED ORDER — FLUTICASONE PROPIONATE 50 MCG/ACT NA SUSP: 2.0000 | Freq: Every day | NASAL | 6 refills | Status: DC

## 2018-12-02 NOTE — Progress Notes (Signed)
   CC: congestion  HPI  Congestion - lots of phlegm, never smoker of either. Cough x 1 month. Can't get the mucous up. "beat the fever" at the beginning was 102. Took theraflu. No fevers anymore. No more runny nose. Tried some cough medicine.   Both hands are hurting - have been hurting for years. Been told she has OA in the past. In the past tried gabapentin. Feels crampy. Using "shaq" cream. Taking vitamins and fish oil. Does APAP and aleeve. No surgery in the past.   ROS: Denies CP, SOB, abdominal pain, dysuria, changes in BMs.   CC, SH/smoking status, and VS noted  Objective: BP 110/70   Pulse 72   Temp 98.1 F (36.7 C) (Oral)   Ht 5\' 8"  (1.727 m)   Wt 177 lb (80.3 kg)   LMP 08/15/2011   SpO2 98%   BMI 26.91 kg/m  Gen: NAD, alert, cooperative, and pleasant. HEENT: NCAT, EOMI, PERRL CV: RRR, no murmur Resp: CTAB, no wheezes, non-labored Hands: no acute bony abnormalities, some pain with clenched fist over digits bilaterally. No tenderness to MTPs or wrists. Normal ROM.  Neuro: Alert and oriented, Speech clear, No gross deficits  Assessment and plan:  Post viral cough - less likely secondary infection, patient is well appearing and well hydrated. No hx of smoking, personally reviewed recent chest xrays, no suggestions of COPD. Trial flonase for post nasal drip component.   Chronic hand pain Likely due to diffuse OA. No acute trauma or bony abnormality to suggest need for repeat XR. Reviewed previous XR with diffuse OA. Patient has tried gabapentin in the past, will trial again starting with QHS to reduce pill burden, patient to titrate to 600mg  QHS if continued pain in 2 weeks. Also trial restarting voltaren gel, this was previously too expensive for her.    At the conclusion of her visit, patients asks for another B12 shot for "energy," explained that she hasn't needed B12 supplementation in many years, we could discuss this further at next visit but we will not treat unless  truly deficient.   No orders of the defined types were placed in this encounter.   Meds ordered this encounter  Medications  . diclofenac sodium (VOLTAREN) 1 % GEL    Sig: Apply 2 g topically 4 (four) times daily as needed.    Dispense:  100 g    Refill:  2  . gabapentin (NEURONTIN) 300 MG capsule    Sig: Take 1 capsule (300 mg total) by mouth at bedtime.    Dispense:  90 capsule    Refill:  3  . fluticasone (FLONASE) 50 MCG/ACT nasal spray    Sig: Place 2 sprays into both nostrils daily.    Dispense:  16 g    Refill:  6     Ralene Ok, MD, PGY3 12/03/2018 8:50 AM

## 2018-12-02 NOTE — Patient Instructions (Addendum)
It was a pleasure to see you today! Thank you for choosing [facility] for your primary care. Cleveland L Barsch was seen for cough, pain   Our plans for today were:  Please try the diclofenac gel. Try the gabapentin at 300mg  every night before bed and if you are doing well in a week or so but still having pain you can increase to 600mg .   Try the nose spray for your cough, call me if it no better in 2-3 weeks.   Best,  Dr. Lindell Noe

## 2018-12-03 DIAGNOSIS — G8929 Other chronic pain: Secondary | ICD-10-CM | POA: Insufficient documentation

## 2018-12-03 DIAGNOSIS — M79643 Pain in unspecified hand: Secondary | ICD-10-CM

## 2018-12-03 NOTE — Assessment & Plan Note (Signed)
Likely due to diffuse OA. No acute trauma or bony abnormality to suggest need for repeat XR. Reviewed previous XR with diffuse OA. Patient has tried gabapentin in the past, will trial again starting with QHS to reduce pill burden, patient to titrate to 600mg  QHS if continued pain in 2 weeks. Also trial restarting voltaren gel, this was previously too expensive for her.

## 2018-12-04 ENCOUNTER — Other Ambulatory Visit: Payer: Self-pay | Admitting: Family Medicine

## 2018-12-04 DIAGNOSIS — Z1231 Encounter for screening mammogram for malignant neoplasm of breast: Secondary | ICD-10-CM

## 2019-01-14 ENCOUNTER — Ambulatory Visit
Admission: RE | Admit: 2019-01-14 | Discharge: 2019-01-14 | Disposition: A | Discharge status: Home / Self Care | Payer: Medicare Other | Source: Ambulatory Visit | Attending: Family Medicine | Admitting: Family Medicine

## 2019-01-14 DIAGNOSIS — Z1231 Encounter for screening mammogram for malignant neoplasm of breast: Secondary | ICD-10-CM

## 2019-02-07 ENCOUNTER — Ambulatory Visit (HOSPITAL_COMMUNITY)
Admission: EM | Admit: 2019-02-07 | Discharge: 2019-02-07 | Disposition: A | Discharge status: Home / Self Care | Payer: Medicare Other | Attending: Internal Medicine | Admitting: Internal Medicine

## 2019-02-07 ENCOUNTER — Other Ambulatory Visit: Payer: Self-pay

## 2019-02-07 ENCOUNTER — Encounter (HOSPITAL_COMMUNITY): Payer: Self-pay

## 2019-02-07 DIAGNOSIS — B9689 Other specified bacterial agents as the cause of diseases classified elsewhere: Secondary | ICD-10-CM | POA: Diagnosis not present

## 2019-02-07 DIAGNOSIS — N760 Acute vaginitis: Secondary | ICD-10-CM | POA: Diagnosis present

## 2019-02-07 MED ORDER — METRONIDAZOLE 500 MG PO TABS: 500.0000 mg | ORAL_TABLET | Freq: Two times a day (BID) | ORAL | 0 refills | Status: DC

## 2019-02-07 NOTE — ED Triage Notes (Signed)
Pt cc stomach pain and pt had unprotected sex . Pt has been high jacked her nerves are bad.
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
## 2019-02-08 LAB — CERVICOVAGINAL ANCILLARY ONLY
Bacterial vaginitis: NEGATIVE
Chlamydia: NEGATIVE
Neisseria Gonorrhea: NEGATIVE
Trichomonas: NEGATIVE

## 2019-02-08 LAB — HIV ANTIBODY (ROUTINE TESTING W REFLEX): HIV Screen 4th Generation wRfx: NONREACTIVE

## 2019-02-08 LAB — RPR: RPR Ser Ql: NONREACTIVE

## 2019-12-22 ENCOUNTER — Other Ambulatory Visit: Payer: Self-pay | Admitting: Family Medicine

## 2019-12-22 DIAGNOSIS — Z1231 Encounter for screening mammogram for malignant neoplasm of breast: Secondary | ICD-10-CM

## 2020-02-03 ENCOUNTER — Ambulatory Visit: Payer: Medicare Other

## 2020-02-07 ENCOUNTER — Other Ambulatory Visit: Payer: Self-pay

## 2020-02-07 ENCOUNTER — Ambulatory Visit
Admission: RE | Admit: 2020-02-07 | Discharge: 2020-02-07 | Disposition: A | Discharge status: Home / Self Care | Payer: Medicare Other | Source: Ambulatory Visit | Attending: Family Medicine | Admitting: Family Medicine

## 2020-02-07 DIAGNOSIS — Z1231 Encounter for screening mammogram for malignant neoplasm of breast: Secondary | ICD-10-CM

## 2020-03-01 ENCOUNTER — Other Ambulatory Visit: Payer: Self-pay

## 2020-03-01 ENCOUNTER — Encounter: Payer: Self-pay | Admitting: Family Medicine

## 2020-03-01 ENCOUNTER — Ambulatory Visit (INDEPENDENT_AMBULATORY_CARE_PROVIDER_SITE_OTHER): Payer: Medicare Other | Admitting: Family Medicine

## 2020-03-01 VITALS — BP 142/74 | HR 82 | Wt 171.6 lb

## 2020-03-01 DIAGNOSIS — F32A Depression, unspecified: Secondary | ICD-10-CM

## 2020-03-01 DIAGNOSIS — Z8679 Personal history of other diseases of the circulatory system: Secondary | ICD-10-CM | POA: Diagnosis not present

## 2020-03-01 DIAGNOSIS — M79643 Pain in unspecified hand: Secondary | ICD-10-CM | POA: Diagnosis not present

## 2020-03-01 DIAGNOSIS — G8929 Other chronic pain: Secondary | ICD-10-CM

## 2020-03-01 DIAGNOSIS — F329 Major depressive disorder, single episode, unspecified: Secondary | ICD-10-CM | POA: Diagnosis not present

## 2020-03-01 MED ORDER — FLUOXETINE HCL 20 MG PO TABS: 20.0000 mg | ORAL_TABLET | Freq: Every day | ORAL | 0 refills | Status: DC

## 2020-03-01 MED ORDER — GABAPENTIN 300 MG PO CAPS: 300.0000 mg | ORAL_CAPSULE | Freq: Every day | ORAL | 3 refills | Status: DC

## 2020-03-01 MED ORDER — GABAPENTIN 300 MG PO CAPS
300.0000 mg | ORAL_CAPSULE | Freq: Every day | ORAL | 3 refills | Status: DC
Start: 2020-03-01 — End: 2020-10-30

## 2020-03-01 NOTE — Assessment & Plan Note (Addendum)
Will refill patient's gabapentin 300 mg nightly at her request.  Can reassess further at another visit when depression is better controlled.

## 2020-03-01 NOTE — Assessment & Plan Note (Signed)
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  Previously seen by neurosurgery at [facility], last in January 2020.  Will need to ensure that patient has repeat in January 2022 of her CTA.

## 2020-03-01 NOTE — Patient Instructions (Signed)
Thank you for coming to see me today. It was a pleasure. Today we talked about:   Your mood:  We will start you on Prozac.    I have sent your gabapentin as well.  Please follow-up with me on 3/22.  If you have any questions or concerns, please do not hesitate to call the office at [phone].  Best,   Ari[place] Constable, DO   Therapy and Counseling Resources Most providers on this list will take Medicaid. Patients with commercial insurance or Medicare should contact their insurance company to get a list of in network providers.  [employer] [facility]  [address], [place], [place] [address]      [phone]  [place] [address] [place], [place] [address]       [place] [address] [place], [place]    [facility] [address], [place], [place]  [facility]:  [place]. [place]  [facility]:  [place] [address]oni [address], [place]  [facility] (under & uninsured) [address], [place] [phone]    [email or web address]    [facility] [place]     Phone:  [phone]     [facility] [place]  [name] [address]      [place], [place] [address] [place]: [place], [place], [place]   [place] ([place] therapist) [address] [place] [address]   [place] [address]    [phone]    [facility]   [place],  [place], [place]   [place]  [place]  [facility]  [address] [place], [place]        [phone]  Open Access/Walk In Clinic under &  uninsured [place],  [address] [place] [phone]):  Mon - Fri from 8 AM - 3 PM  [facility] the [place],  ([place])   [place], [place]: [phone]) 8:30 - 12; 1 - 2:30  [facility] Service of the [place],  [place], [place]    [phone]):8:30 - 12; 2 - 3PM  [facility] [employer],  [address],  [place]; [phone]):   Mon - Fri 8 AM - 5 PM  [facility] [place]  MWF 12:30 to 3:00 or call to schedule an appointment  [phone]  Specific Provider options Psychology Today  [email or web address].com/us 1. click on find a therapist  2. enter your zip code 3. left side and select or tailor a therapist for your specific need.   [facility] Provider Directory [email or web address]  (Medicaid)   Follow all drop down to find a provider  [place] or [email or web address] [address], [place], [place] Recovery support and educational   In home counseling [place] Telephone: [phone]  office in [place] [email or web address]   Does not take reg. Medicaid or Medicare private insurance [facility], [place] health Choice, [facility], [place], [place], [place], [place] Health Choice  24- Hour Availability:  . [place]  Health   [phone] or [phone]  . Family Service of the [employer] [phone]  [facility] Crisis Service  [phone]   . [employer]  [phone] (after hours)  . Therapeutic Alternative/Mobile Crisis   [phone])196-0804  . [place] National Suicide Hotline  [phone]) 557-6007 ([place])  . Call 911 or go to emergency room  . [employer]  (904)231-1230);  [place] and [employer]   . [employer] ACCESS  [phone]); [place], [place], [place], [place], [place], [place], [place]

## 2020-03-01 NOTE — Assessment & Plan Note (Signed)
Patient precepted with Dr. Hartford Poli.  Will start on Prozac 20 mg and see patient back on 3/22.  If she tolerates this dose, will likely increase to 40 mg daily at this time.  She has been on this medication previously and reports doing well on it, therefore chose to restart.  She seems to be having an acute stressor that is worsening her once controlled depression and Dr. Hartford Poli has offered to see the patient for counseling.  Offered this to the patient and she is very interested in this.  We will send Dr. Hartford Poli a message with the patient's information to give her a call.  She does not have any active SI.  Did discuss with the patient that SI would be a medical emergency, given suicide hotline number.

## 2020-03-01 NOTE — Progress Notes (Signed)
    SUBJECTIVE:   CHIEF COMPLAINT / HPI:   Depression Patient presents today to discuss her mood.  Reports that she has been having a difficult time since her sister passed in January.  States that her sister told her that she had stage IV cancer in December and passed 1 month later.  She feels as though she is alone in dealing with this.  She has had problems with family because of disagreements that happened after the funeral.  She is incredibly tearful when talking about this.  Has a history of depression and GAD.  Has been on prozac and zoloft previously on chart review.  She is interested in restarting 1 of these medications and talking with someone.  She denies SI.  PHQ-9 score 16.  Hx Aneurysm Hx Left A1 aneurysm of 2 mm in size, has been seen by neurosurgery at [facility] last in January 2020.  At that time they recommended repeat CTA in 2 years.  Hand Cramps  Reports a history of chronic hand pain and hand cramps for which she has taking gabapentin 300 mg nightly in the past.  States that she has been without this for quite some time since she has not come to the doctor and needs to restart this.  States that dealing with this pain has been worsening her depression.   PERTINENT  PMH / PSH: Depression, GAD, HTN, Hx ACA aneurysm  HM: UTD aside from missing flu vaccination  OBJECTIVE:   BP (!) 142/74   Pulse 82   Wt 171 lb 9.6 oz (77.8 kg)   LMP 08/15/2011   SpO2 99%   BMI 26.09 kg/m    Physical Exam:  General: 59 y.o. female in NAD Lungs: Breathing comfortably on room air Skin: warm and dry Psych: Tearful while discussing her current mood, denies SI, thought process linear and logical, good insight   ASSESSMENT/PLAN:   Depression Patient precepted with Dr. Hartford Poli.  Will start on Prozac 20 mg and see patient back on 3/22.  If she tolerates this dose, will likely increase to 40 mg daily at this time.  She has been on this medication previously and reports doing well on it,  therefore chose to restart.  She seems to be having an acute stressor that is worsening her once controlled depression and Dr. Hartford Poli has offered to see the patient for counseling.  Offered this to the patient and she is very interested in this.  We will send Dr. Hartford Poli a message with the patient's information to give her a call.  She does not have any active SI.  Did discuss with the patient that SI would be a medical emergency, given suicide hotline number.     Cleophas Dunker, [place]

## 2020-03-02 ENCOUNTER — Telehealth: Payer: Self-pay | Admitting: Psychology

## 2020-03-02 NOTE — Telephone Encounter (Signed)
-----   Message from Cleophas Dunker, DO sent at 03/01/2020  5:28 PM EST ----- This is the patient we talked about on the phone who is struggling with the recent death of her sister.  She was interested in talking with you.  I told her you would give her a call.  Thanks!

## 2020-03-02 NOTE — Telephone Encounter (Signed)
Scheduled in person f/u [facility] appt for 3/18 at 1130

## 2020-03-09 ENCOUNTER — Other Ambulatory Visit: Payer: Self-pay

## 2020-03-09 ENCOUNTER — Ambulatory Visit (INDEPENDENT_AMBULATORY_CARE_PROVIDER_SITE_OTHER): Payer: Medicare Other | Admitting: Psychology

## 2020-03-09 DIAGNOSIS — F4321 Adjustment disorder with depressed mood: Secondary | ICD-10-CM
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
## 2020-03-13 ENCOUNTER — Other Ambulatory Visit: Payer: Self-pay | Admitting: Family Medicine

## 2020-03-13 ENCOUNTER — Ambulatory Visit (INDEPENDENT_AMBULATORY_CARE_PROVIDER_SITE_OTHER): Payer: Medicare Other | Admitting: Family Medicine

## 2020-03-13 ENCOUNTER — Other Ambulatory Visit: Payer: Self-pay

## 2020-03-13 DIAGNOSIS — F329 Major depressive disorder, single episode, unspecified: Secondary | ICD-10-CM

## 2020-03-13 DIAGNOSIS — F32A Depression, unspecified: Secondary | ICD-10-CM

## 2020-03-13 MED ORDER — FLUOXETINE HCL 20 MG PO TABS: 40.0000 mg | ORAL_TABLET | Freq: Every day | ORAL | 0 refills | Status: DC

## 2020-03-13 NOTE — Assessment & Plan Note (Signed)
Seems to be doing better today.  Has been seen by Dr. Hartford Poli and has no follow-up appointment scheduled for 4/1.  Denies SI/HI today.  PHQ-9 score the same as previously.  Has been without the Prozac for 3 days, but was tolerating 20 mg prior to this, will go ahead and send a prescription for 40 mg to the pharmacy.  Suicide precautions discussed with patient and given the number for the suicide hotline.  Follow-up in 3 to 4 weeks.

## 2020-03-13 NOTE — Progress Notes (Signed)
    SUBJECTIVE:   CHIEF COMPLAINT / HPI:   Depression Patient seen on 3/10 noted to have significant depression given the passing of her sister in January.  PHQ-9 score at that time 16.  Was started on Prozac 20 mg.  Lost it 3 days ago, but was tolerating it well. She is asking for another rx and wants to go up to 40mg .  States that overall she is doing better.  Had appointment with Dr. Hartford Poli and states that it went well.  Denies SI/HI.      Office Visit from 03/13/2020 in [place]  PHQ-9 Total Score  4      PERTINENT  PMH / PSH: Depression, GAD, HTN  OBJECTIVE:   BP 115/80   Pulse 79   Wt 173 lb 3.2 oz (78.6 kg)   LMP 08/15/2011   BMI 26.33 kg/m   Physical Exam:  General: 59 y.o. female in NAD Lungs: Breathing comfortably on room air Skin: warm and dry Extremities: No edema Psych: Mood and affect appropriate for circumstance, no SI/HI, thought process linear and logical   ASSESSMENT/PLAN:   Depression Seems to be doing better today.  Has been seen by Dr. Hartford Poli and has no follow-up appointment scheduled for 4/1.  Denies SI/HI today.  PHQ-9 score the same as previously.  Has been without the Prozac for 3 days, but was tolerating 20 mg prior to this, will go ahead and send a prescription for 40 mg to the pharmacy.  Suicide precautions discussed with patient and given the number for the suicide hotline.  Follow-up in 3 to 4 weeks.     Cleophas Dunker, [place]

## 2020-03-13 NOTE — Patient Instructions (Signed)
Thank you for coming to see me today. It was a pleasure. Today we talked about:   Your mood: I'm glad things are improving.  Follow up with Dr. Hartford Poli on 4/1.  I have increased your medication to 40mg  daily.  You will take two of the 20mg  tablets every day.    Please follow-up with me in 3-4 weeks or sooner as needed.  If you have any questions or concerns, please do not hesitate to call the office at [phone].  Best,   [place] Constable, DO

## 2020-03-14 ENCOUNTER — Telehealth: Payer: Self-pay | Admitting: Family Medicine

## 2020-03-14 NOTE — Telephone Encounter (Signed)
Pt called stating she need a referral to [facility] for a colonoscopy  Office number is [phone]  Pt oh # is [phone]

## 2020-03-15 ENCOUNTER — Other Ambulatory Visit: Payer: Self-pay | Admitting: Family Medicine

## 2020-03-15 DIAGNOSIS — Z1211 Encounter for screening for malignant neoplasm of colon: Secondary | ICD-10-CM

## 2020-03-15 NOTE — Progress Notes (Signed)
Patient requesting ref to [facility] for colonoscopy

## 2020-03-15 NOTE — Telephone Encounter (Signed)
Referral placed, please let patient know.  Thanks

## 2020-03-23 ENCOUNTER — Ambulatory Visit: Payer: Medicare Other | Admitting: Psychology

## 2020-03-29 ENCOUNTER — Other Ambulatory Visit: Payer: Self-pay

## 2020-03-29 ENCOUNTER — Ambulatory Visit (INDEPENDENT_AMBULATORY_CARE_PROVIDER_SITE_OTHER): Payer: Medicare Other | Admitting: Psychology

## 2020-03-29 DIAGNOSIS — F4321 Adjustment disorder with depressed mood: Secondary | ICD-10-CM
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
## 2020-04-18 ENCOUNTER — Ambulatory Visit (INDEPENDENT_AMBULATORY_CARE_PROVIDER_SITE_OTHER): Payer: Medicare Other | Admitting: Family Medicine

## 2020-04-18 ENCOUNTER — Encounter: Payer: Self-pay | Admitting: Family Medicine

## 2020-04-18 ENCOUNTER — Other Ambulatory Visit: Payer: Self-pay

## 2020-04-18 VITALS — BP 100/60 | HR 72 | Ht 68.0 in | Wt 174.1 lb

## 2020-04-18 DIAGNOSIS — Z1211 Encounter for screening for malignant neoplasm of colon: Secondary | ICD-10-CM | POA: Diagnosis not present

## 2020-04-18 DIAGNOSIS — R739 Hyperglycemia, unspecified: Secondary | ICD-10-CM | POA: Diagnosis not present

## 2020-04-18 DIAGNOSIS — R7303 Prediabetes: Secondary | ICD-10-CM | POA: Insufficient documentation

## 2020-04-18 DIAGNOSIS — F329 Major depressive disorder, single episode, unspecified: Secondary | ICD-10-CM | POA: Diagnosis not present

## 2020-04-18 DIAGNOSIS — F32A Depression, unspecified: Secondary | ICD-10-CM

## 2020-04-18 LAB — POCT GLYCOSYLATED HEMOGLOBIN (HGB A1C): HbA1c, POC (controlled diabetic range): 6 % (ref 0.0–7.0)

## 2020-04-18 NOTE — Progress Notes (Signed)
    SUBJECTIVE:   CHIEF COMPLAINT / HPI:   F/U depression Previously seen on 3/10 and 3/22 for depression.  Also having grief reaction to loss of her sister.  Was increased from Prozac 20 mg to 40 mg daily on 3/22. Has been doing well with this.  Patient last seen by Dr. Hartford Poli for therapy on 4/7, has an appointment tomorrow.  Denies SI.  Has not been resting well at night.  Has not tried anything for this.  Has trouble falling asleep and tosses and turns.  Getting about 3 hrs.      Office Visit from 04/18/2020 in [facility]  PHQ-9 Total Score  12      Elevated A1c  Had a home visit and A1c was 6.2.  Was advised to follow up with her doctor regarding this.  Has been trying to watch what she was eating.  Has been drinking water.    Jury Duty Patient is requesting a letter for jury duty dismissal.  States that she is not able to do this because of claustrophobia and depression.  She has been very tearful and would not be able to perform her required duties.  Colonoscopy  Patient reports that she had a previous colonoscopy and is wondering when she is due for another one.  Previous colonoscopy at [place]  PMH / PSH: HTN, depression, GAD  OBJECTIVE:   BP 100/60   Pulse 72   Ht 5\' 8"  (1.727 m)   Wt 174 lb 2 oz (79 kg)   LMP 08/15/2011   SpO2 99%   BMI 26.48 kg/m    Physical Exam:  General: 59 y.o. female in NAD Cardio: RRR no m/r/g Lungs: CTAB, no wheezing, no rhonchi, no crackles, no IWOB on RA Skin: warm and dry Extremities: No edema  Results for orders placed or performed in visit on 04/18/20 (from the past 24 hour(s))  HgB A1c     Status: None   Collection Time: 04/18/20 10:25 AM  Result Value Ref Range   Hemoglobin A1C     HbA1c POC (<> result, manual entry)     HbA1c, POC (prediabetic range)     HbA1c, POC (controlled diabetic range) 6.0 0.0 - 7.0 %    ASSESSMENT/PLAN:   Depression PHQ-9 improved from 16-12.  Doing well on  Prozac 40 mg daily.  Does not want to make any changes to this at this time.  Has an appointment with Dr. Hartford Poli tomorrow for therapy.  Has been having some insomnia with this.  Discussed good sleep hygiene, meditation at night, deep breathing exercises, also recommended over-the-counter melatonin.  Patient will try this.  Return in 2 months.  Letter provided for patient for jury duty given her depression and claustrophobia.  Pre-diabetes A1c 6.0 today.  Discussed with patient that she has prediabetes.  Advised her to focus on diet and exercise.  Screen for colon cancer Patient's last colonoscopy in 2015, was noted to repeat in 3 years.  Advised patient she is due for this.  She is planning to call [facility] to schedule this.     Cleophas Dunker, [place]

## 2020-04-18 NOTE — Patient Instructions (Addendum)
Thank you for coming to see me today. It was a pleasure. Today we talked about:   Try some deep breathing and meditation at night.  You can also try melatonin that you can get at the pharmacy.  You are due for a colonoscopy.  Please use the form that we have given you to schedule this at your convenience.   You have pre-diabetes.  Continue to work on [employer] and exercise.  Please follow-up with me in 2 months.  If you have any questions or concerns, please do not hesitate to call the office at [phone].  Best,   Arizona Constable, DO

## 2020-04-18 NOTE — Assessment & Plan Note (Signed)
A1c 6.0 today.  Discussed with patient that she has prediabetes.  Advised her to focus on diet and exercise.

## 2020-04-18 NOTE — Assessment & Plan Note (Signed)
Patient's last colonoscopy in 2015, was noted to repeat in 3 years.  Advised patient she is due for this.  She is planning to call [facility] to schedule this.

## 2020-04-18 NOTE — Assessment & Plan Note (Addendum)
PHQ-9 improved from 16-12.  Doing well on Prozac 40 mg daily.  Does not want to make any changes to this at this time.  Has an appointment with Dr. Hartford Poli tomorrow for therapy.  Has been having some insomnia with this.  Discussed good sleep hygiene, meditation at night, deep breathing exercises, also recommended over-the-counter melatonin.  Patient will try this.  Return in 2 months.  Letter provided for patient for jury duty given her depression and claustrophobia.

## 2020-04-19 ENCOUNTER — Other Ambulatory Visit: Payer: Self-pay

## 2020-04-19 ENCOUNTER — Ambulatory Visit (INDEPENDENT_AMBULATORY_CARE_PROVIDER_SITE_OTHER): Payer: Medicare Other | Admitting: Psychology

## 2020-04-19 DIAGNOSIS — F4321 Adjustment disorder with depressed mood: Secondary | ICD-10-CM
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
## 2020-05-04 ENCOUNTER — Ambulatory Visit (INDEPENDENT_AMBULATORY_CARE_PROVIDER_SITE_OTHER): Payer: Medicare Other | Admitting: Psychology

## 2020-05-04 ENCOUNTER — Other Ambulatory Visit: Payer: Self-pay

## 2020-05-04 DIAGNOSIS — F4321 Adjustment disorder with depressed mood: Secondary | ICD-10-CM

## 2020-05-04 NOTE — BH Specialist Note (Signed)
Integrated Behavioral Health Visit  05/04/2020 LASHAWNNA BACHELLER [id number]   Session Start time: 11  Session End time: 37 Total time: 110  Referring Provider: Dr. Sandi Carne Type of Visit: Telephonic   PRESENTING CONCERNS: Patient and/or family reports the following symptoms/concerns: Pt reported improved syx of grief.  Pt reported she has found some resolution in her grief and experiences less bouts of sadness.  Pt reports she plans to get back involved church for community. Pt stated she is in a place to have less frequent therapy visits.  Duration of problem: 4 months; Severity of problem: mild  STRENGTHS (Protective Factors/Coping Skills): listening to music and faith   GOALS ADDRESSED: Patient will: 1.  Reduce symptoms of: grief: sadness, anger, frustration have reduced 2. Increase knowledge and/or ability of: self-management skills: giving time to grieve; engaging in coping strategies 1. Demonstrate ability to: Begin healthy grieving over loss:   INTERVENTIONS: Interventions utilized:  Supportive Counseling and emotion regulation  Standardized Assessments completed: Not Needed  ASSESSMENT: Patient currently experiencinggrief due to the loss of her sister.   Patient may benefit fromsupportive counseling.  PLAN: 1. Follow up with behavioral health clinician on :how coping strategies are going with managing her grief 2. Behavioral recommendations:supportive therapy 3. Referral(s):[place] (In Clinic)  Erlinda Hong, PhD., LMFT-A

## 2020-06-05 ENCOUNTER — Ambulatory Visit: Payer: Medicare Other | Admitting: Psychology

## 2020-06-13 ENCOUNTER — Other Ambulatory Visit: Payer: Self-pay

## 2020-06-13 ENCOUNTER — Ambulatory Visit (INDEPENDENT_AMBULATORY_CARE_PROVIDER_SITE_OTHER): Payer: Medicare Other | Admitting: Psychology

## 2020-06-13 DIAGNOSIS — F4321 Adjustment disorder with depressed mood: Secondary | ICD-10-CM

## 2020-06-13 NOTE — BH Specialist Note (Signed)
Integrated Behavioral Health Visit  06/13/2020 ALEEHA BOLINE [id number]   Session Start time: 130  Session End time: 2 Total time: 69  Referring Provider: Dr. Sandi Carne   PRESENTING CONCERNS: Patient and/or family reports the following symptoms/concerns: Pt reported that she is still dealing with frustration with family relationships.  Pt denied HI.  Duration of problem: 5 months; Severity of problem: mild  STRENGTHS (Protective Factors/Coping Skills): Music and faith   GOALS ADDRESSED:  Patient will:  1. Reduce symptoms of: grief : sadness, anger, frustration have reduced 2. Increase knowledge and/or ability of: self-management 3.  skills : giving time to grieve; engaging in coping strategies  4. Demonstrate ability to: continue healthy grieving over loss  INTERVENTIONS:  Interventions utilized: Supportive Counseling and emotion regulation  Standardized Assessments completed: Not Needed   ASSESSMENT:  Patient currently experiencing grief due to the loss of her sister.  Patient may benefit from supportive counseling.  PLAN:  1. Follow up with behavioral health clinician on : how coping strategies are going with managing her grief 2. Behavioral recommendations: supportive therapy 3. Referral(s): [place] (In Clinic) Erlinda Hong, PhD., LMFT-A

## 2020-06-25 ENCOUNTER — Encounter (HOSPITAL_COMMUNITY): Payer: Self-pay | Admitting: Family Medicine

## 2020-06-25 ENCOUNTER — Other Ambulatory Visit: Payer: Self-pay

## 2020-06-25 ENCOUNTER — Ambulatory Visit (HOSPITAL_COMMUNITY)
Admission: EM | Admit: 2020-06-25 | Discharge: 2020-06-25 | Disposition: A | Discharge status: Home / Self Care | Payer: Medicare Other | Attending: Family Medicine | Admitting: Family Medicine

## 2020-06-25 DIAGNOSIS — R011 Cardiac murmur, unspecified: Secondary | ICD-10-CM | POA: Insufficient documentation

## 2020-06-25 DIAGNOSIS — R7303 Prediabetes: Secondary | ICD-10-CM | POA: Insufficient documentation

## 2020-06-25 DIAGNOSIS — G4700 Insomnia, unspecified: Secondary | ICD-10-CM | POA: Diagnosis not present

## 2020-06-25 DIAGNOSIS — Z79899 Other long term (current) drug therapy: Secondary | ICD-10-CM | POA: Insufficient documentation

## 2020-06-25 DIAGNOSIS — Z90722 Acquired absence of ovaries, bilateral: Secondary | ICD-10-CM | POA: Diagnosis not present

## 2020-06-25 DIAGNOSIS — Z791 Long term (current) use of non-steroidal anti-inflammatories (NSAID): Secondary | ICD-10-CM | POA: Insufficient documentation

## 2020-06-25 DIAGNOSIS — S30861A Insect bite (nonvenomous) of abdominal wall, initial encounter: Secondary | ICD-10-CM

## 2020-06-25 DIAGNOSIS — H5462 Unqualified visual loss, left eye, normal vision right eye: Secondary | ICD-10-CM | POA: Insufficient documentation

## 2020-06-25 DIAGNOSIS — G5603 Carpal tunnel syndrome, bilateral upper limbs: Secondary | ICD-10-CM | POA: Diagnosis not present

## 2020-06-25 DIAGNOSIS — Z8679 Personal history of other diseases of the circulatory system: Secondary | ICD-10-CM | POA: Insufficient documentation

## 2020-06-25 DIAGNOSIS — D573 Sickle-cell trait: Secondary | ICD-10-CM | POA: Insufficient documentation

## 2020-06-25 DIAGNOSIS — Z90710 Acquired absence of both cervix and uterus: Secondary | ICD-10-CM | POA: Insufficient documentation

## 2020-06-25 DIAGNOSIS — W57XXXA Bitten or stung by nonvenomous insect and other nonvenomous arthropods, initial encounter: Secondary | ICD-10-CM

## 2020-06-25 DIAGNOSIS — Z86010 Personal history of colonic polyps: Secondary | ICD-10-CM | POA: Insufficient documentation

## 2020-06-25 DIAGNOSIS — I10 Essential (primary) hypertension: Secondary | ICD-10-CM | POA: Diagnosis not present

## 2020-06-25 DIAGNOSIS — G43909 Migraine, unspecified, not intractable, without status migrainosus: Secondary | ICD-10-CM | POA: Insufficient documentation

## 2020-06-25 DIAGNOSIS — F329 Major depressive disorder, single episode, unspecified: Secondary | ICD-10-CM | POA: Insufficient documentation

## 2020-06-25 DIAGNOSIS — Z885 Allergy status to narcotic agent status: Secondary | ICD-10-CM | POA: Insufficient documentation

## 2020-06-25 DIAGNOSIS — R1033 Periumbilical pain: Secondary | ICD-10-CM

## 2020-06-25 DIAGNOSIS — M1990 Unspecified osteoarthritis, unspecified site: Secondary | ICD-10-CM | POA: Diagnosis not present

## 2020-06-25 DIAGNOSIS — F411 Generalized anxiety disorder: Secondary | ICD-10-CM | POA: Diagnosis not present

## 2020-06-25 DIAGNOSIS — G894 Chronic pain syndrome: Secondary | ICD-10-CM | POA: Diagnosis not present

## 2020-06-25 DIAGNOSIS — K219 Gastro-esophageal reflux disease without esophagitis: Secondary | ICD-10-CM | POA: Insufficient documentation

## 2020-06-25 DIAGNOSIS — Z8719 Personal history of other diseases of the digestive system: Secondary | ICD-10-CM | POA: Diagnosis not present

## 2020-06-25 MED ORDER — AZITHROMYCIN 250 MG PO TABS
ORAL_TABLET | ORAL | Status: AC
  Filled 2020-06-25: qty 4

## 2020-06-25 MED ORDER — CEFTRIAXONE SODIUM 500 MG IJ SOLR
INTRAMUSCULAR | Status: AC
  Filled 2020-06-25: qty 500

## 2020-06-25 MED ORDER — LIDOCAINE HCL (PF) 1 % IJ SOLN
INTRAMUSCULAR | Status: AC
  Filled 2020-06-25: qty 2

## 2020-06-25 MED ORDER — CEFTRIAXONE SODIUM 500 MG IJ SOLR
500.0000 mg | Freq: Once | INTRAMUSCULAR | Status: AC
  Administered 2020-06-25: 500 mg via INTRAMUSCULAR

## 2020-06-25 MED ORDER — AZITHROMYCIN 250 MG PO TABS
1000.0000 mg | ORAL_TABLET | Freq: Once | ORAL | Status: AC
  Administered 2020-06-25: 1000 mg via ORAL
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
## 2020-06-25 NOTE — ED Triage Notes (Signed)
Generalized abdominal pain since having intercourse 2 days ago.  Patient says condom broke.  Patient requesting testing.  Patient has an odor.  Denies urinary symptoms, denies vaginal discharge

## 2020-06-25 NOTE — Discharge Instructions (Addendum)
We will call you if additional medication is needed once the test results return.

## 2020-06-27 LAB — CERVICOVAGINAL ANCILLARY ONLY
Bacterial Vaginitis (gardnerella): NEGATIVE
Candida Glabrata: NEGATIVE
Candida Vaginitis: NEGATIVE
Chlamydia: NEGATIVE
Comment: NEGATIVE
Comment: NEGATIVE
Comment: NEGATIVE
Comment: NEGATIVE
Comment: NEGATIVE
Comment: NORMAL
Neisseria Gonorrhea: NEGATIVE
Trichomonas: NEGATIVE

## 2020-07-05 ENCOUNTER — Ambulatory Visit (INDEPENDENT_AMBULATORY_CARE_PROVIDER_SITE_OTHER): Payer: Medicare Other | Admitting: Psychology

## 2020-07-05 ENCOUNTER — Other Ambulatory Visit: Payer: Self-pay

## 2020-07-05 DIAGNOSIS — F4321 Adjustment disorder with depressed mood: Secondary | ICD-10-CM

## 2020-07-05 NOTE — BH Specialist Note (Addendum)
Integrated Behavioral Health Visit   07/05/2020 Megan Kemp [id number]   Session Start time: 130  Session End time: 2 Total time: 66  Referring Provider: Dr. Sandi Carne    PRESENTING CONCERNS: Patient and/or family reports the following symptoms/concerns: Pt reported she has been experiencing stress related to her relationships.  Pt shared she feels others depend on her and they do not appreciate her.  Pt and Dr. Hartford Poli discussed her emotions around this and how trust has been an struggle for her in her life.   Pt shared she is still processing her emotions.  Denied HI/SI.     Duration of problem: 6 months; Severity of problem: mild  STRENGTHS (Protective Factors/Coping Skills): Music and faith   GOALS ADDRESSED:  Patient will:  1. Reduce symptoms of: grief : sadness, anger, frustration have reduced 2. Increase knowledge and/or ability of: self-management 3.  skills : giving time to grieve; engaging in coping strategies  4. Demonstrate ability to: continue healthy grieving over loss  INTERVENTIONS:  Interventions utilized: Supportive Counseling and emotion regulation  Standardized Assessments completed: Not Needed   ASSESSMENT:  Patient currently experiencing grief due to the loss of her sister.  Patient may benefit from supportive counseling.  PLAN:  1. Follow up with behavioral health clinician on : how coping strategies are going with managing her grief 2. Behavioral recommendations: supportive therapy 3. Referral(s): [place] (In Clinic) Erlinda Hong, PhD., LMFT-A

## 2020-07-12 ENCOUNTER — Other Ambulatory Visit: Payer: Self-pay

## 2020-07-12 ENCOUNTER — Ambulatory Visit (INDEPENDENT_AMBULATORY_CARE_PROVIDER_SITE_OTHER): Payer: Medicare Other | Admitting: Psychology

## 2020-07-12 DIAGNOSIS — F4321 Adjustment disorder with depressed mood: Secondary | ICD-10-CM

## 2020-07-12 NOTE — BH Specialist Note (Addendum)
Integrated Behavioral Health Visit   07/12/2020 Megan Kemp [id number]   Session Start time: 2  Session End time: 230 Total time: 60  Referring Provider: Dr. Sandi Carne   PRESENTING CONCERNS: Patient and/or family reports the following symptoms/concerns: Pt reported that she has improved mood.  Pt shared she had her high school reunion which was positive for her.  Pt shared about a positive experience with a friend where they were able to support her. Pt shared that she still feels others are   Duration of problem:6 months; Severity of problem:mild  STRENGTHS (Protective Factors/Coping Skills): Music and faith  GOALS ADDRESSED:  Patient will:  1. Reduce symptoms of: grief : sadness, anger, frustration have reduced 2. Increase knowledge and/or ability of: self-management 3. skills : giving time to grieve; engaging in coping strategies  4. Demonstrate ability [id number] grieving over loss  INTERVENTIONS:  Interventions utilized: Supportive Counseling and emotion regulation  Standardized Assessments completed: Not Needed   ASSESSMENT:  Patient currently experiencing grief due to the loss of her sister.  Patient may benefit from supportive counseling.   PLAN:  1. Follow up with behavioral health clinician on : how coping strategies are going with managing her grief 2. Behavioral recommendations: supportive therapy 3. Referral(s): [place] (In Clinic) Erlinda Hong, PhD., LMFT-A

## 2020-07-26 ENCOUNTER — Ambulatory Visit (INDEPENDENT_AMBULATORY_CARE_PROVIDER_SITE_OTHER): Payer: Medicare Other | Admitting: Psychology

## 2020-07-26 ENCOUNTER — Telehealth: Payer: Self-pay | Admitting: Family Medicine

## 2020-07-26 DIAGNOSIS — F4321 Adjustment disorder with depressed mood: Secondary | ICD-10-CM

## 2020-07-26 NOTE — Telephone Encounter (Signed)
Please call lpatient about her coloncopy having done on [facility] needs referral for this.  Patient's p#[phone].  Also gave me phone # to call [place] doctor needs referral P# for GI Dr. Is [phone].  I would advise to call patient she wasn't really making sense about all of it.

## 2020-07-27 ENCOUNTER — Telehealth: Payer: Self-pay | Admitting: Family Medicine

## 2020-07-27 ENCOUNTER — Other Ambulatory Visit: Payer: Self-pay

## 2020-07-27 ENCOUNTER — Other Ambulatory Visit: Payer: Self-pay | Admitting: Family Medicine

## 2020-07-27 DIAGNOSIS — Z86010 Personal history of colonic polyps: Secondary | ICD-10-CM

## 2020-07-27 NOTE — Telephone Encounter (Signed)
Referral placed. Thank you! Dr. Higinio Plan

## 2020-07-27 NOTE — Telephone Encounter (Signed)
Pt is requesting a referral for a colonoscopy -  some polyps found

## 2020-07-27 NOTE — BH Specialist Note (Signed)
Integrated Behavioral Health Visit   07/27/2020 CLYDELL ALBERTS [id number]   Session Start time: 52  Session End time: 3 Total time: 38  Referring Provider: Dr. Sandi Carne  Confidentiality has been reviewed with patient.  Patient verbalized consent for a integrated care visit.   PRESENTING CONCERNS: Patient and/or family reports the following symptoms/concerns: Pt reported it is the anniversary of her mother's death.  Pt and Dr. Hartford Poli discussed when her mother died there was discourse in the relationships with her mothers passing.  Pt and Dr. Hartford Poli discussed why her sister's passing was so triggering because of the previous discord.   Pt denied HI/SI.   Duration of problem:96months; Severity of problem:mild  STRENGTHS (Protective Factors/Coping Skills): Music and faith  GOALS ADDRESSED:  Patient will:  1. Reduce symptoms of: grief : sadness, anger, frustration have reduced 2. Increase knowledge and/or ability of: self-management skills : giving time to grieve; engaging in coping strategies  3. Demonstrate ability [id number] grieving over loss  INTERVENTIONS:  Interventions utilized: Supportive Counseling and emotion regulation  Standardized Assessments completed: Not Needed   ASSESSMENT:  Patient currently experiencing grief due to the loss of her sister.  Patient may benefit from supportive counseling.   PLAN:  1. Follow up with behavioral health clinician on : how coping strategies are going with managing her grief 2. Behavioral recommendations: supportive therapy 3. Referral(s): [place] (In Clinic) Erlinda Hong, PhD., LMFT-A

## 2020-07-28 NOTE — Telephone Encounter (Signed)
Pt informed. Megan Kemp T Ruston Fedora, CMA  

## 2020-08-09 ENCOUNTER — Ambulatory Visit: Payer: Medicare Other | Admitting: Psychology

## 2020-10-18 ENCOUNTER — Telehealth: Payer: Self-pay | Admitting: Psychology

## 2020-10-18 NOTE — Telephone Encounter (Signed)
Scheduled appt 11/10 at 11am

## 2020-10-30 ENCOUNTER — Other Ambulatory Visit: Payer: Self-pay

## 2020-10-30 ENCOUNTER — Ambulatory Visit (INDEPENDENT_AMBULATORY_CARE_PROVIDER_SITE_OTHER): Payer: Medicare Other | Admitting: Family Medicine

## 2020-10-30 ENCOUNTER — Encounter: Payer: Self-pay | Admitting: Family Medicine

## 2020-10-30 VITALS — BP 128/78 | HR 68 | Ht 68.0 in | Wt 170.0 lb

## 2020-10-30 DIAGNOSIS — K219 Gastro-esophageal reflux disease without esophagitis: Secondary | ICD-10-CM

## 2020-10-30 DIAGNOSIS — M79643 Pain in unspecified hand: Secondary | ICD-10-CM | POA: Diagnosis not present

## 2020-10-30 DIAGNOSIS — Z86010 Personal history of colonic polyps: Secondary | ICD-10-CM

## 2020-10-30 DIAGNOSIS — I10 Essential (primary) hypertension: Secondary | ICD-10-CM

## 2020-10-30 DIAGNOSIS — F32A Depression, unspecified: Secondary | ICD-10-CM | POA: Diagnosis not present

## 2020-10-30 DIAGNOSIS — G8929 Other chronic pain: Secondary | ICD-10-CM

## 2020-10-30 DIAGNOSIS — R7303 Prediabetes: Secondary | ICD-10-CM

## 2020-10-30 LAB — POCT GLYCOSYLATED HEMOGLOBIN (HGB A1C): Hemoglobin A1C: 5.9 % — AB (ref 4.0–5.6)

## 2020-10-30 MED ORDER — GABAPENTIN 300 MG PO CAPS: 300.0000 mg | ORAL_CAPSULE | Freq: Every day | ORAL | 3 refills | Status: DC

## 2020-10-30 MED ORDER — PANTOPRAZOLE SODIUM 20 MG PO TBEC: 20.0000 mg | DELAYED_RELEASE_TABLET | Freq: Every day | ORAL | 3 refills | Status: DC

## 2020-10-30 MED ORDER — SERTRALINE HCL 50 MG PO TABS: 50.0000 mg | ORAL_TABLET | Freq: Every day | ORAL | 3 refills | Status: DC

## 2020-10-30 NOTE — Assessment & Plan Note (Addendum)
Continues to have some symptoms of grief reaction, has recently lost 2 more people in her life. She has an appointment with Dr. Hartford Poli in 2 days. Given that she has been off Prozac for quite some time and now insurance is asking for change, will go ahead and restart Zoloft at 50 mg. She will follow-up in 1 month to ensure that she is doing well.

## 2020-10-30 NOTE — Assessment & Plan Note (Signed)
Not currently on medication. BP looks good today. Obtaining BMP today.

## 2020-10-30 NOTE — Assessment & Plan Note (Signed)
A1c today down from 6.0 to 5.9. Will obtain a BMP and lipid panel today as she has not had labs at least 2018.

## 2020-10-30 NOTE — Assessment & Plan Note (Signed)
Refill provided for Protonix 20 mg. Would not use for an extended period of time given her age.

## 2020-10-30 NOTE — Patient Instructions (Signed)
Thank you for coming to see me today. It was a pleasure. Today we talked about:   We will get some labs today.  If they are abnormal or we need to do something about them, I will call you.  If they are normal, I will send you a message on MyChart (if it is active) or a letter in the mail.  If you don't hear from [place] in 2 weeks, please call the office at the number below.  Continue to follow with Dr. Hartford Poli.  Start taking Zoloft 50mg  daily.    Please follow-up with me in 1 month.  If you have any questions or concerns, please do not hesitate to call the office at [phone].  Best,   [place] Constable, DO

## 2020-10-30 NOTE — Assessment & Plan Note (Signed)
She is scheduled for colonoscopy in the [facility] system in December. Discussed for constipation she can use MiraLAX and if this does not help, to let me know.

## 2020-10-30 NOTE — Progress Notes (Signed)
° ° °  SUBJECTIVE:   CHIEF COMPLAINT / HPI:   Depression Patient had been seen for depression starting back in May after the loss of her sister She was started on Prozac, has been without it for awhile She wants to go back on something She also had multiple visits with Dr. Hartford Poli for integrated behavioral health, seeing her again soon Has been doing well   Prediabetes Last A1c 6.0 on 04/18/2020 Today is 5.9 States that she has been watching her diet  Hypertension Had previously been on lisinopril, no longer on any antihypertensives Last BMP 11/25/2017, WNL  History of colon polyps Patient's last colonoscopy was in 2015 at [facility] She was advised to have a repeat in 3 years This was ordered at the last visit, plans to go to [facility] in December Notes some constipation and wants to know what she can do for this She also states that she has some reflux and used protonix in the past and would like a refill on this  COVID vaccine, got it in September and October Wanted to know if she should get the shingles vaccination, she does report a history of chickenpox  PERTINENT  PMH / PSH: Sickle cell trait, depression, history of colonic polyps, prediabetes, hypertension, GERD, migraine  OBJECTIVE:   BP 128/78    Pulse 68    Ht 5\' 8"  (1.727 m)    Wt 170 lb (77.1 kg)    LMP 08/15/2011    SpO2 96%    BMI 25.85 kg/m    Physical Exam:  General: 59 y.o. female in NAD Cardio: RRR no m/r/g Lungs: CTAB, no wheezing, no rhonchi, no crackles, no IWOB on RA Skin: warm and dry Extremities: No edema Psych: Mood and affect appropriate for circumstance, thought process linear and logical, appropriate dress, no SI   ASSESSMENT/PLAN:   Depression Continues to have some symptoms of grief reaction, has recently lost 2 more people in her life. She has an appointment with Dr. Hartford Poli in 2 days. Given that she has been off Prozac for quite some time and now insurance is asking for change, will go ahead  and restart Zoloft at 50 mg. She will follow-up in 1 month to ensure that she is doing well.  Pre-diabetes A1c today down from 6.0 to 5.9. Will obtain a BMP and lipid panel today as she has not had labs at least 2018.  HTN (hypertension) Not currently on medication. BP looks good today. Obtaining BMP today.  Hx of colonic polyps She is scheduled for colonoscopy in the [facility] system in December. Discussed for constipation she can use MiraLAX and if this does not help, to let me know.  GERD (gastroesophageal reflux disease) Refill provided for Protonix 20 mg. Would not use for an extended period of time given her age.   She declines a flu shot today. Has received her Covid vaccine, should bring in card at next visit. Advised that Shingrix would be a good option for her given that she is over 20. Advised that she may not feel well immediately after receiving her vaccination and up to 2 days later. She can obtain this at the pharmacy. She would like to continue to think about it.  Cleophas Dunker, [place]

## 2020-10-31 ENCOUNTER — Encounter: Payer: Self-pay | Admitting: Family Medicine

## 2020-10-31 LAB — BASIC METABOLIC PANEL
BUN/Creatinine Ratio: 15 (ref 9–23)
BUN: 14 mg/dL (ref 6–24)
CO2: 27 mmol/L (ref 20–29)
Calcium: 9.6 mg/dL (ref 8.7–10.2)
Chloride: 103 mmol/L (ref 96–106)
Creatinine, Ser: 0.93 mg/dL (ref 0.57–1.00)
GFR calc Af Amer: 78 mL/min/{1.73_m2} (ref >59)
GFR calc non Af Amer: 68 mL/min/{1.73_m2} (ref >59)
Glucose: 87 mg/dL (ref 65–99)
Potassium: 4.5 mmol/L (ref 3.5–5.2)
Sodium: 139 mmol/L (ref 134–144)

## 2020-10-31 LAB — LIPID PANEL
Chol/HDL Ratio: 2.8 ratio (ref 0.0–4.4)
Cholesterol, Total: 135 mg/dL (ref 100–199)
HDL: 49 mg/dL (ref >39)
LDL Chol Calc (NIH): 72 mg/dL (ref 0–99)
Triglycerides: 68 mg/dL (ref 0–149)
VLDL Cholesterol Cal: 14 mg/dL (ref 5–40)

## 2020-11-01 ENCOUNTER — Ambulatory Visit (INDEPENDENT_AMBULATORY_CARE_PROVIDER_SITE_OTHER): Payer: Medicare Other | Admitting: Psychology

## 2020-11-01 ENCOUNTER — Other Ambulatory Visit: Payer: Self-pay

## 2020-11-01 DIAGNOSIS — F32A Depression, unspecified: Secondary | ICD-10-CM
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
## 2020-11-20 ENCOUNTER — Ambulatory Visit: Payer: Medicare Other | Admitting: Psychology

## 2020-11-21 ENCOUNTER — Ambulatory Visit: Payer: Medicare Other | Admitting: Psychology

## 2020-11-28 ENCOUNTER — Other Ambulatory Visit: Payer: Self-pay | Admitting: Family Medicine

## 2020-11-28 ENCOUNTER — Telehealth: Payer: Self-pay | Admitting: *Deleted

## 2020-11-28 DIAGNOSIS — F32A Depression, unspecified: Secondary | ICD-10-CM

## 2020-11-28 MED ORDER — SERTRALINE HCL 50 MG PO TABS: 50.0000 mg | ORAL_TABLET | Freq: Every day | ORAL | 1 refills | Status: DC

## 2020-11-28 NOTE — Telephone Encounter (Signed)
Rx sent 

## 2020-11-28 NOTE — Telephone Encounter (Signed)
Received request for a 90 day supply authorization for pts Sertraline. This came from [facility] at [place].Megan Kemp, CMA

## 2020-11-29 ENCOUNTER — Other Ambulatory Visit: Payer: Self-pay

## 2020-11-29 ENCOUNTER — Ambulatory Visit (INDEPENDENT_AMBULATORY_CARE_PROVIDER_SITE_OTHER): Payer: Medicare Other | Admitting: Psychology

## 2020-11-29 DIAGNOSIS — F32A Depression, unspecified: Secondary | ICD-10-CM
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
## 2020-12-04 DIAGNOSIS — Z8719 Personal history of other diseases of the digestive system: Secondary | ICD-10-CM | POA: Diagnosis not present

## 2020-12-04 DIAGNOSIS — F32A Depression, unspecified: Secondary | ICD-10-CM | POA: Diagnosis not present

## 2020-12-04 DIAGNOSIS — Z86010 Personal history of colonic polyps: Secondary | ICD-10-CM | POA: Diagnosis not present

## 2020-12-04 DIAGNOSIS — K5900 Constipation, unspecified: Secondary | ICD-10-CM | POA: Diagnosis not present

## 2020-12-04 DIAGNOSIS — K224 Dyskinesia of esophagus: Secondary | ICD-10-CM | POA: Diagnosis not present

## 2020-12-04 DIAGNOSIS — K219 Gastro-esophageal reflux disease without esophagitis: Secondary | ICD-10-CM | POA: Diagnosis not present

## 2020-12-04 DIAGNOSIS — G43909 Migraine, unspecified, not intractable, without status migrainosus: Secondary | ICD-10-CM | POA: Diagnosis not present

## 2020-12-04 DIAGNOSIS — K3184 Gastroparesis: Secondary | ICD-10-CM | POA: Diagnosis not present

## 2021-01-03 ENCOUNTER — Other Ambulatory Visit: Payer: Self-pay | Admitting: Family Medicine

## 2021-01-03 DIAGNOSIS — Z1231 Encounter for screening mammogram for malignant neoplasm of breast: Secondary | ICD-10-CM

## 2021-02-14 ENCOUNTER — Ambulatory Visit
Admission: RE | Admit: 2021-02-14 | Discharge: 2021-02-14 | Disposition: A | Discharge status: Home / Self Care | Payer: Medicare Other | Source: Ambulatory Visit | Attending: Family Medicine | Admitting: Family Medicine

## 2021-02-14 ENCOUNTER — Ambulatory Visit: Payer: Medicare Other

## 2021-02-14 ENCOUNTER — Other Ambulatory Visit: Payer: Self-pay

## 2021-02-14 DIAGNOSIS — Z1231 Encounter for screening mammogram for malignant neoplasm of breast: Secondary | ICD-10-CM | POA: Diagnosis not present

## 2021-06-04 ENCOUNTER — Ambulatory Visit (INDEPENDENT_AMBULATORY_CARE_PROVIDER_SITE_OTHER): Payer: Medicare Other | Admitting: Psychology

## 2021-06-04 ENCOUNTER — Other Ambulatory Visit: Payer: Self-pay

## 2021-06-04 DIAGNOSIS — F32A Depression, unspecified: Secondary | ICD-10-CM | POA: Diagnosis not present
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
## 2021-06-14 ENCOUNTER — Ambulatory Visit (INDEPENDENT_AMBULATORY_CARE_PROVIDER_SITE_OTHER): Payer: Medicare Other | Admitting: Family Medicine

## 2021-06-14 ENCOUNTER — Other Ambulatory Visit: Payer: Self-pay

## 2021-06-14 ENCOUNTER — Encounter: Payer: Self-pay | Admitting: Family Medicine

## 2021-06-14 VITALS — BP 112/60 | HR 76 | Ht 68.0 in | Wt 164.8 lb

## 2021-06-14 DIAGNOSIS — R52 Pain, unspecified: Secondary | ICD-10-CM | POA: Diagnosis not present

## 2021-06-14 DIAGNOSIS — M79643 Pain in unspecified hand: Secondary | ICD-10-CM | POA: Diagnosis not present

## 2021-06-14 DIAGNOSIS — I10 Essential (primary) hypertension: Secondary | ICD-10-CM

## 2021-06-14 DIAGNOSIS — R7309 Other abnormal glucose: Secondary | ICD-10-CM

## 2021-06-14 DIAGNOSIS — F32A Depression, unspecified: Secondary | ICD-10-CM | POA: Diagnosis not present

## 2021-06-14 DIAGNOSIS — R7303 Prediabetes: Secondary | ICD-10-CM | POA: Diagnosis not present

## 2021-06-14 DIAGNOSIS — G8929 Other chronic pain: Secondary | ICD-10-CM

## 2021-06-14 LAB — POCT GLYCOSYLATED HEMOGLOBIN (HGB A1C): HbA1c, POC (controlled diabetic range): 5.9 % (ref 0.0–7.0)

## 2021-06-14 MED ORDER — GABAPENTIN 300 MG PO CAPS: 300.0000 mg | ORAL_CAPSULE | Freq: Two times a day (BID) | ORAL | 3 refills | Status: DC

## 2021-06-14 MED ORDER — DULOXETINE HCL 30 MG PO CPEP: 30.0000 mg | ORAL_CAPSULE | Freq: Every day | ORAL | 1 refills | Status: DC

## 2021-06-14 NOTE — Assessment & Plan Note (Signed)
Pulses are appropriate bilaterally.  It does appear that she has a history of chronic generalized pain.  Will refer to physical therapy as this would likely be helpful and she is requesting this.  We will also change her from Zoloft to Cymbalta for hopeful benefit per above.  Can consider blood work at next visit if she does not have improvement.  Also had her increase the gabapentin from nightly to twice daily.  Follow-up in 1 to 2 months with new PCP.

## 2021-06-14 NOTE — Assessment & Plan Note (Signed)
BP well controlled off of medications.  Continue to monitor.  Follow-up with new PCP in 1 to 2 months.

## 2021-06-14 NOTE — Patient Instructions (Signed)
Thank you for coming to see me today. It was a pleasure. Today we talked about:   We will change your medication from zoloft to cymbalta and start you at the lowest dose.  I have placed a referral to Physical Therapy for your leg pain and cramping.  If you do not hear from them in the next 2 weeks, please give [place] a call.   Please follow-up with new PCP in 1-2 months.  Your new doctor will be Dr. Larae Grooms.  If you have any questions or concerns, please do not hesitate to call the office at [phone].  Best,   [place] Constable, DO

## 2021-06-14 NOTE — Assessment & Plan Note (Signed)
A1c well-controlled.  Continue current lifestyle modifications.

## 2021-06-14 NOTE — Progress Notes (Signed)
SUBJECTIVE:   CHIEF COMPLAINT / HPI:   Prediabetes Last A1c on 10/30/2020 was 5.9 LDL at that time was 72  Hypertension Not on any antihypertensives, previously on lisinopril Last BMP 10/30/2020, WNL Does not check at home No chest pain or shortness of breath  Depression Currently follows with Dr. Hartford Poli for therapy Current regimen: Zoloft 50 mg daily Doing well  [place] Office Visit from 06/14/2021 in [place]  PHQ-9 Total Score 9        Generalized Cramping Gets cramping in her bilateral hands and bilateral legs off and on No difference in pain between day and night Had this before and went to physical therapy for it which helped She would like to try to go back for this In PT in 2017 Pain is constant for 3-4 months Has had it every now and then since 2017, but now it is bad again She stretches herself to help She was taking aleve, but it didn't really help Walks with a cane because of her pain   Pneumonia vaccine, counseled, wants to do on Monday COVID-vaccine, has had Shingrix, counseled  Has a colonoscopy scheduled  PERTINENT  PMH / PSH: Migraine, HTN. Dysphagia, GERD, Sciatica, Rash, Sickle Cell Trait, Depression, GAD, Hx cerebral aneurysm  OBJECTIVE:   BP 112/60   Pulse 76   Ht 5\' 8"  (1.727 m)   Wt 164 lb 12.8 oz (74.8 kg)   LMP 08/15/2011   SpO2 98%   BMI 25.06 kg/m    Physical Exam:  General: 60 y.o. female in NAD Cardio: RRR  Lungs: CTAB, no wheezing, no rhonchi, no crackles, no IWOB on RA Skin: warm and dry Extremities: No edema BLE Psych: Mood and affect appropriate for circumstance, appropriate dress, thought process linear and logical, good insight, no SI  Bilateral Knee: - Inspection: no gross deformity. No swelling/effusion, erythema or bruising. Skin intact - Palpation: no TTP b/l - ROM: full active ROM with flexion and extension in knee and hip - Strength: 5/5 strength b/l - Neuro/vasc: NV intact  b/l, 2+ DP pulses b/l  Results for orders placed or performed in visit on 06/14/21 (from the past 24 hour(s))  POCT glycosylated hemoglobin (Hb A1C)     Status: None   Collection Time: 06/14/21  2:22 PM  Result Value Ref Range   Hemoglobin A1C     HbA1c POC (<> result, manual entry)     HbA1c, POC (prediabetic range)     HbA1c, POC (controlled diabetic range) 5.9 0.0 - 7.0 %      ASSESSMENT/PLAN:   Pre-diabetes A1c well-controlled.  Continue current lifestyle modifications.  HTN (hypertension) BP well controlled off of medications.  Continue to monitor.  Follow-up with new PCP in 1 to 2 months.  Depression Given patient's pain, will change her from Zoloft to Cymbalta.  Given low-dose of Cymbalta, safe to change directly to SNRI.  She will discontinue Zoloft and start Cymbalta at 30 mg daily.  Discussed appropriate expectations for improvement with SNRI and patient will follow-up in 1 to 2 months with a new PCP.  Did advise that she could increase dose if needed.  She is following with Dr. Hartford Poli and will see her on Monday.  Generalized pain Pulses are appropriate bilaterally.  It does appear that she has a history of chronic generalized pain.  Will refer to physical therapy as this would likely be helpful and she is requesting this.  We will also change her  from Zoloft to Cymbalta for hopeful benefit per above.  Can consider blood work at next visit if she does not have improvement.  Also had her increase the gabapentin from nightly to twice daily.  Follow-up in 1 to 2 months with new PCP.     Cleophas Dunker, [place]

## 2021-06-14 NOTE — Assessment & Plan Note (Signed)
Given patient's pain, will change her from Zoloft to Cymbalta.  Given low-dose of Cymbalta, safe to change directly to SNRI.  She will discontinue Zoloft and start Cymbalta at 30 mg daily.  Discussed appropriate expectations for improvement with SNRI and patient will follow-up in 1 to 2 months with a new PCP.  Did advise that she could increase dose if needed.  She is following with Dr. Hartford Poli and will see her on Monday.

## 2021-06-18 ENCOUNTER — Ambulatory Visit (INDEPENDENT_AMBULATORY_CARE_PROVIDER_SITE_OTHER): Payer: Medicare Other

## 2021-06-18 ENCOUNTER — Ambulatory Visit (INDEPENDENT_AMBULATORY_CARE_PROVIDER_SITE_OTHER): Payer: Medicare Other | Admitting: Psychology

## 2021-06-18 ENCOUNTER — Other Ambulatory Visit: Payer: Self-pay

## 2021-06-18 DIAGNOSIS — F32A Depression, unspecified: Secondary | ICD-10-CM | POA: Diagnosis not present

## 2021-06-18 DIAGNOSIS — Z23 Encounter for immunization: Secondary | ICD-10-CM | POA: Diagnosis not present

## 2021-06-18 NOTE — BH Specialist Note (Signed)
Integrated Behavioral Health Follow Up In-Person Visit  MRN: [id number] Name: Megan Kemp  Number of [place] Clinician visits:  12 Session Start time: 2  Session End time: 230 Total time: 30 minutes  Types of Service: Individual psychotherapy   Subjective: Megan Kemp is a 60 y.o. female  Patient was referred by Dr. Sandi Carne for grief/depression. Patient reports the following symptoms/concerns: Pt reported that she has been struggling with isolation and feeling others don't support her.  Pt shared about her family history and has felt this way since her mother passed. Discussed triggers and themes in her life that cause her to feel isolated and overwhelmed.    Pt denied thoughts of hurting others and has been trying to step back from those who trigger her.  Duration of problem: past year ; Severity of problem: moderate  Objective: Mood: Depressed and Affect: Appropriate Risk of harm to self or others: No plan to harm self or others  Life Context: Family and Social: stressful family dynamics Self-Care: faith important to her   Patient and/or Family's Strengths/Protective Factors: Concrete supports in place (healthy food, safe environments, etc.)  Goals Addressed: Patient will:  Reduce symptoms of: depression and grief; sadness, frustration, isolation, feelings of being overwhelmed   Increase knowledge and/or ability of: self-management skills: grief processing; leaning on faith    Progress towards Goals: Ongoing  Interventions: Interventions utilized:  Supportive Counseling, Communication Skills, and Supportive Reflection Standardized Assessments completed: Not Needed  Patient and/or Family Response:   Patient Centered Plan: Patient is on the following Treatment Plan(s): Depression treatment plan  Assessment: Patient currently experiencing depression due to loss of sister and family stress   Patient may benefit from supportive  counseling and emotion regulation   Plan: Follow up with behavioral health clinician on : 2 weeks Behavioral recommendations: emotion regulation  Referral(s): [place] (In Clinic)  Erlinda Hong, PhD., LMFT-A

## 2021-06-18 NOTE — Progress Notes (Signed)
Patient presents for Prevnar 20 vaccine.  Vaccine given without complication.  See admin for details.

## 2021-06-19 ENCOUNTER — Ambulatory Visit: Payer: Medicare Other

## 2021-06-19 NOTE — Progress Notes (Deleted)
     SUBJECTIVE:   CHIEF COMPLAINT / HPI:   Megan Kemp is a 60 y.o. female presents for wax build up   Wax build up Reports sx since ***. Denies hearing loss, tinnitus, vertigo etc   ***  Flowsheet Row Office Visit from 06/14/2021 in [facility]  PHQ-9 Total Score 9        Health Maintenance Due  Topic   COVID-19 Vaccine (1)   Pneumococcal Vaccine 91-71 Years old (1 - PCV)   Zoster Vaccines- Shingrix (1 of 2)      PERTINENT  PMH / PSH:   OBJECTIVE:   LMP 08/15/2011    General: Alert, no acute distress Cardio: Normal S1 and S2, RRR, no r/m/g Pulm: CTAB, normal work of breathing Abdomen: Bowel sounds normal. Abdomen soft and non-tender.  Extremities: No peripheral edema.  Neuro: Cranial nerves grossly intact   ASSESSMENT/PLAN:   No problem-specific Assessment & Plan notes found for this encounter.     Lattie Haw, MD PGY-2 [facility]

## 2021-06-26 ENCOUNTER — Ambulatory Visit: Payer: Medicare Other

## 2021-06-29 ENCOUNTER — Other Ambulatory Visit: Payer: Self-pay

## 2021-06-29 ENCOUNTER — Encounter: Payer: Self-pay | Admitting: Physical Therapy

## 2021-06-29 ENCOUNTER — Ambulatory Visit: Payer: Medicare Other

## 2021-06-29 ENCOUNTER — Ambulatory Visit: Payer: Medicare Other | Attending: Family Medicine | Admitting: Physical Therapy

## 2021-06-29 DIAGNOSIS — R262 Difficulty in walking, not elsewhere classified: Secondary | ICD-10-CM | POA: Diagnosis present

## 2021-06-29 DIAGNOSIS — M25552 Pain in left hip: Secondary | ICD-10-CM | POA: Insufficient documentation

## 2021-06-29 DIAGNOSIS — M25611 Stiffness of right shoulder, not elsewhere classified: Secondary | ICD-10-CM | POA: Diagnosis present

## 2021-06-29 DIAGNOSIS — M6281 Muscle weakness (generalized): Secondary | ICD-10-CM | POA: Diagnosis present

## 2021-06-29 DIAGNOSIS — M25612 Stiffness of left shoulder, not elsewhere classified: Secondary | ICD-10-CM | POA: Insufficient documentation

## 2021-06-29 DIAGNOSIS — M25551 Pain in right hip: Secondary | ICD-10-CM | POA: Diagnosis present

## 2021-06-29 DIAGNOSIS — M79601 Pain in right arm: Secondary | ICD-10-CM | POA: Insufficient documentation

## 2021-06-29 DIAGNOSIS — M79602 Pain in left arm: Secondary | ICD-10-CM | POA: Insufficient documentation

## 2021-06-29 NOTE — Patient Instructions (Addendum)
Access Code: [email or web address] URL: [email or web address] Date: 06/29/2021 Prepared by: Pollyann Samples  Exercises Supine Bridge - 2 x daily - 7 x weekly - 2 sets - 10 reps Supine Lower Trunk Rotation - 2 x daily - 7 x weekly - 2 sets - 10 reps Supine Active Straight Leg Raise - 2 x daily - 7 x weekly - 2 sets - 10 reps Alternating Over Head Reach - 2 x daily - 7 x weekly - 2 sets - 10 reps  Aquatic Therapy at [name]-  What to Expect!  Where:   [facility] [place], [place] [address] Rehab phone [phone]  NOTE:  You will receive an automated phone message reminding you of your appt and it will say the appointment is at the [facility].          How to Prepare: Please make sure you drink 8 ounces of water about one hour prior to your pool session A caregiver may attend if needed with the patient to help assist as needed. A caregiver can sit in the pool room on chair. Please arrive IN YOUR SUIT and 15 minutes prior to your appointment - this helps to avoid delays in starting your session. Please make sure to attend to any toileting needs prior to entering the pool [place] rooms for changing are provided.   There is direct access to the pool deck form the locker room.  You can lock your belongings in a locker with lock provided. Once on the pool deck your therapist will ask if you have signed the Patient  Consent and Assignment of Benefits form before beginning treatment Your therapist may take your blood pressure prior to, during and after your session if indicated We usually try and create a home exercise program based on activities we do in the pool.  Please be thinking about who might be able to assist you in the pool should you need to participate in an aquatic home exercise program at the time of discharge if you need assistance.  Some patients do not want to or do not have the ability  to participate in an aquatic home program - this is not a barrier in any way to you participating in aquatic therapy as part of your current therapy plan! After Discharge from PT, you can continue using home program at  the [facility], there is a drop-in fee for $5 ($45 a month)or for 60 years  or older $4.00 ($40 a month for seniors ) or any local [facility] pool.  Memberships for purchase are available for gym/pool at [facility] LAST AQUATIC VISIT.  PLEASE MAKE SURE THAT YOU HAVE A [address]  APPOINTMENT SCHEDULED.   About the pool: Pool is located approximately 500 FT from the entrance of the building.  Please bring a support person if you need assistance traveling this      distance.   Your therapist will assist you in entering the water; there are multiple ways to           enter including stairs with railings, a walk in ramp, a roll in chair and a                      mechanical lift. Your therapist will determine the most appropriate way for you.  Water temperature is  usually between 88-90 degrees  There may be up to 2 other swimmers in the pool at the same time  The pool deck is tile, please wear shoes with good traction if you prefer not to be barefoot.    Contact Info:  For appointment scheduling and cancellations:         Please call the [facility]       All sessions are 45 minutes
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
## 2021-07-02 ENCOUNTER — Other Ambulatory Visit: Payer: Self-pay

## 2021-07-02 ENCOUNTER — Ambulatory Visit (INDEPENDENT_AMBULATORY_CARE_PROVIDER_SITE_OTHER): Payer: Medicare Other | Admitting: Family Medicine

## 2021-07-02 VITALS — BP 110/78 | HR 63 | Ht 68.0 in | Wt 167.8 lb

## 2021-07-02 DIAGNOSIS — R52 Pain, unspecified: Secondary | ICD-10-CM

## 2021-07-02 DIAGNOSIS — M2550 Pain in unspecified joint: Secondary | ICD-10-CM | POA: Diagnosis not present

## 2021-07-02 DIAGNOSIS — H6690 Otitis media, unspecified, unspecified ear: Secondary | ICD-10-CM

## 2021-07-02 MED ORDER — DICLOFENAC SODIUM 1 % EX GEL: 4.0000 g | Freq: Four times a day (QID) | CUTANEOUS | 1 refills | Status: DC

## 2021-07-02 MED ORDER — AMOXICILLIN 875 MG PO TABS: 875.0000 mg | ORAL_TABLET | Freq: Two times a day (BID) | ORAL | 0 refills | Status: AC

## 2021-07-02 NOTE — Progress Notes (Signed)
   SUBJECTIVE:   CHIEF COMPLAINT / HPI:   Chief Complaint  Patient presents with   Ear Problem     Megan Kemp is a 60 y.o. female here for ear discomfort. Reports right sided ear pain for the past 2-3 weeks. Has vertigo (chronic). Feels like something is stuck in her ears. Does not use Q-tips.  Denies tinnitis, headaches, muffled hearing, jaw pain or painful chewing.    PERTINENT  PMH / PSH: reviewed and updated as appropriate   OBJECTIVE:   BP 110/78   Pulse 63   Ht 5\' 8"  (1.727 m)   Wt 167 lb 12.8 oz (76.1 kg)   LMP 08/15/2011   SpO2 98%   BMI 25.51 kg/m    GEN:     alert, well appearing female, no distress    HENT:  mucus membranes moist, oropharyngeal without lesions or erythema,  nares patent, no nasal discharge, right cerumen impaction, left TM normal, re-assess right TM: opaque  EYES:   pupils reactive, no scleral injection  NECK:  supple, normal ROM, no lymphadenopathy  RESP:  no increased work of breathing  Skin:   warm and dry    ASSESSMENT/PLAN:   No problem-specific Assessment & Plan notes found for this encounter.  ?AOM   Cerumen Impaction  Irrigated successfully. Pt with 2-3 weeks of ear discomfort. TM opaque and erythematous concerning for AOM. Treat with Amoxicillin BID for 10 days. Pt to follow up if not improving.    Lyndee Hensen, DO PGY-2, [facility] 07/02/2021

## 2021-07-02 NOTE — Patient Instructions (Signed)
It was great seeing you today!   Stop by the pharmacy to pick up your antibiotics. Take twice a day for the next 10 days. I'd like to see you back if your ear pain does not go away with antibiotics. We may need to send you to an ear, nose and throat doctor.    If you have questions or concerns please do not hesitate to call at [phone].  Dr. Rushie [facility]

## 2021-07-03 ENCOUNTER — Telehealth: Payer: Self-pay | Admitting: Psychology

## 2021-07-03 ENCOUNTER — Ambulatory Visit: Payer: Medicare Other | Admitting: Psychology

## 2021-07-03 NOTE — Telephone Encounter (Signed)
Spoke with pt regarding issues with billing with her insurance. Gave resources for care with that take her insurance. Will f/u next week to check-in

## 2021-07-05 ENCOUNTER — Other Ambulatory Visit: Payer: Self-pay

## 2021-07-05 ENCOUNTER — Ambulatory Visit: Payer: Medicare Other | Admitting: Physical Therapy

## 2021-07-05 DIAGNOSIS — M6281 Muscle weakness (generalized): Secondary | ICD-10-CM | POA: Diagnosis not present

## 2021-07-05 DIAGNOSIS — M25551 Pain in right hip: Secondary | ICD-10-CM

## 2021-07-05 DIAGNOSIS — M25552 Pain in left hip: Secondary | ICD-10-CM

## 2021-07-05 DIAGNOSIS — M25612 Stiffness of left shoulder, not elsewhere classified: Secondary | ICD-10-CM

## 2021-07-05 DIAGNOSIS — M25611 Stiffness of right shoulder, not elsewhere classified: Secondary | ICD-10-CM

## 2021-07-05 DIAGNOSIS — M79601 Pain in right arm: Secondary | ICD-10-CM

## 2021-07-05 DIAGNOSIS — R262 Difficulty in walking, not elsewhere classified: Secondary | ICD-10-CM
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
## 2021-07-05 NOTE — Patient Instructions (Signed)
Aquatic Therapy at [facility]-  What to Expect!  Where:   [facility] [address] [place], [place] [address] Rehab phone [phone]  NOTE:  You will receive an automated phone message reminding you of your appt and it will say the appointment is at the [address] [facility] [address] [facility] clinic.          How to Prepare: Please make sure you drink 8 ounces of water about one hour prior to your pool session A caregiver may attend if needed with the patient to help assist as needed. A caregiver can sit in the pool room on chair. Please arrive IN YOUR SUIT and 15 minutes prior to your appointment - this helps to avoid delays in starting your session. Please make sure to attend to any toileting needs prior to entering the pool Locker rooms for changing are provided.   There is direct access to the pool deck form the locker room.  You can lock your belongings in a locker with lock provided. Once on the pool deck your therapist will ask if you have signed the Patient  Consent and Assignment of Benefits form before beginning treatment Your therapist may take your blood pressure prior to, during and after your session if indicated We usually try and create a home exercise program based on activities we do in the pool.  Please be thinking about who might be able to assist you in the pool should you need to participate in an aquatic home exercise program at the time of discharge if you need assistance.  Some patients do not want to or do not have the ability to participate in an aquatic home program - this is not a barrier in any way to you participating in aquatic therapy as part of your current therapy plan! After Discharge from PT, you can continue using home program at  the [facility]/, there is a drop-in fee for $5 ($45 a month)or for 60 years  or older $4.00 ($40 a month for seniors ) or any local [employer] pool.  Memberships for purchase are  available for gym/pool at [facility]  IT IS VERY IMPORTANT THAT YOUR LAST VISIT BE IN THE CLINIC AT [address] AFTER YOUR LAST AQUATIC VISIT.  PLEASE MAKE SURE THAT YOU HAVE A [address]  APPOINTMENT SCHEDULED.   About the pool: Pool is located approximately 500 FT from the entrance of the building.  Please bring a support person if you need assistance traveling this      distance.   Your therapist will assist you in entering the water; there are two ways to           enter: stairs with railings, and a mechanical lift. Your therapist will determine the most appropriate way for you.  Water temperature is usually between 88-90 degrees  There may be up to 2 other swimmers in the pool at the same time  The pool deck is tile, please wear shoes with good traction if you prefer not to be barefoot.    Contact Info:  For appointment scheduling and cancellations:         Please call the [facility]  PH:[phone]              Aquatic Therapy  [facility]       All sessions are 45 minutes                                                    

## 2021-07-10 ENCOUNTER — Other Ambulatory Visit: Payer: Self-pay

## 2021-07-10 ENCOUNTER — Encounter: Payer: Self-pay | Admitting: Physical Therapy

## 2021-07-10 ENCOUNTER — Ambulatory Visit: Payer: Medicare Other | Admitting: Physical Therapy

## 2021-07-10 DIAGNOSIS — M25552 Pain in left hip: Secondary | ICD-10-CM

## 2021-07-10 DIAGNOSIS — R262 Difficulty in walking, not elsewhere classified: Secondary | ICD-10-CM

## 2021-07-10 DIAGNOSIS — M25611 Stiffness of right shoulder, not elsewhere classified: Secondary | ICD-10-CM

## 2021-07-10 DIAGNOSIS — M6281 Muscle weakness (generalized): Secondary | ICD-10-CM

## 2021-07-10 DIAGNOSIS — M79602 Pain in left arm: Secondary | ICD-10-CM

## 2021-07-10 DIAGNOSIS — M79601 Pain in right arm: Secondary | ICD-10-CM

## 2021-07-10 DIAGNOSIS — M25551 Pain in right hip: Secondary | ICD-10-CM
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
## 2021-07-12 ENCOUNTER — Other Ambulatory Visit: Payer: Self-pay

## 2021-07-12 ENCOUNTER — Encounter (HOSPITAL_BASED_OUTPATIENT_CLINIC_OR_DEPARTMENT_OTHER): Payer: Self-pay | Admitting: Physical Therapy

## 2021-07-12 ENCOUNTER — Ambulatory Visit (HOSPITAL_BASED_OUTPATIENT_CLINIC_OR_DEPARTMENT_OTHER): Payer: Medicare Other | Attending: Family Medicine | Admitting: Physical Therapy

## 2021-07-12 DIAGNOSIS — M25552 Pain in left hip: Secondary | ICD-10-CM | POA: Insufficient documentation

## 2021-07-12 DIAGNOSIS — M6281 Muscle weakness (generalized): Secondary | ICD-10-CM | POA: Diagnosis present

## 2021-07-12 DIAGNOSIS — R262 Difficulty in walking, not elsewhere classified: Secondary | ICD-10-CM | POA: Diagnosis present

## 2021-07-12 DIAGNOSIS — M79602 Pain in left arm: Secondary | ICD-10-CM | POA: Insufficient documentation

## 2021-07-12 DIAGNOSIS — M25611 Stiffness of right shoulder, not elsewhere classified: Secondary | ICD-10-CM | POA: Insufficient documentation

## 2021-07-12 DIAGNOSIS — M25551 Pain in right hip: Secondary | ICD-10-CM | POA: Diagnosis present

## 2021-07-12 DIAGNOSIS — M25612 Stiffness of left shoulder, not elsewhere classified: Secondary | ICD-10-CM | POA: Insufficient documentation

## 2021-07-12 DIAGNOSIS — M79601 Pain in right arm: Secondary | ICD-10-CM | POA: Diagnosis present
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
## 2021-07-16 ENCOUNTER — Ambulatory Visit (HOSPITAL_BASED_OUTPATIENT_CLINIC_OR_DEPARTMENT_OTHER): Payer: Medicare Other | Admitting: Physical Therapy

## 2021-07-18 ENCOUNTER — Other Ambulatory Visit: Payer: Self-pay

## 2021-07-18 ENCOUNTER — Encounter: Payer: Self-pay | Admitting: Physical Therapy

## 2021-07-18 ENCOUNTER — Ambulatory Visit: Payer: Medicare Other | Admitting: Physical Therapy

## 2021-07-18 DIAGNOSIS — M25611 Stiffness of right shoulder, not elsewhere classified: Secondary | ICD-10-CM

## 2021-07-18 DIAGNOSIS — M6281 Muscle weakness (generalized): Secondary | ICD-10-CM

## 2021-07-18 DIAGNOSIS — M25551 Pain in right hip: Secondary | ICD-10-CM

## 2021-07-18 DIAGNOSIS — M25552 Pain in left hip: Secondary | ICD-10-CM

## 2021-07-18 DIAGNOSIS — M79601 Pain in right arm: Secondary | ICD-10-CM

## 2021-07-18 DIAGNOSIS — M25612 Stiffness of left shoulder, not elsewhere classified: Secondary | ICD-10-CM

## 2021-07-18 DIAGNOSIS — R262 Difficulty in walking, not elsewhere classified: Secondary | ICD-10-CM

## 2021-07-18 DIAGNOSIS — M79602 Pain in left arm: Secondary | ICD-10-CM

## 2021-07-18 NOTE — Patient Instructions (Signed)
Access Code: [email or web address] URL: [email or web address] Date: 07/18/2021 Prepared by: Pollyann Samples  New Exercises:  Sidelying Thoracic Rotation with Open Book - 1 x daily - 7 x weekly - 2 sets - 10 reps Clamshell with Resistance - 1 x daily - 7 x weekly - 2 sets - 10 reps
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
## 2021-07-24 ENCOUNTER — Encounter (HOSPITAL_BASED_OUTPATIENT_CLINIC_OR_DEPARTMENT_OTHER): Payer: Self-pay | Admitting: Physical Therapy

## 2021-07-24 ENCOUNTER — Ambulatory Visit (HOSPITAL_BASED_OUTPATIENT_CLINIC_OR_DEPARTMENT_OTHER): Payer: Medicare Other | Attending: Family Medicine | Admitting: Physical Therapy

## 2021-07-24 ENCOUNTER — Other Ambulatory Visit: Payer: Self-pay

## 2021-07-24 DIAGNOSIS — M25552 Pain in left hip: Secondary | ICD-10-CM | POA: Diagnosis present

## 2021-07-24 DIAGNOSIS — R262 Difficulty in walking, not elsewhere classified: Secondary | ICD-10-CM

## 2021-07-24 DIAGNOSIS — M79602 Pain in left arm: Secondary | ICD-10-CM | POA: Diagnosis present

## 2021-07-24 DIAGNOSIS — M6281 Muscle weakness (generalized): Secondary | ICD-10-CM

## 2021-07-24 DIAGNOSIS — M25611 Stiffness of right shoulder, not elsewhere classified: Secondary | ICD-10-CM | POA: Insufficient documentation

## 2021-07-24 DIAGNOSIS — M25551 Pain in right hip: Secondary | ICD-10-CM

## 2021-07-24 DIAGNOSIS — M79601 Pain in right arm: Secondary | ICD-10-CM

## 2021-07-24 DIAGNOSIS — M25612 Stiffness of left shoulder, not elsewhere classified: Secondary | ICD-10-CM | POA: Diagnosis present
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
## 2021-07-26 ENCOUNTER — Other Ambulatory Visit: Payer: Self-pay

## 2021-07-26 ENCOUNTER — Ambulatory Visit: Payer: Medicare Other | Attending: Family Medicine

## 2021-07-26 DIAGNOSIS — M25612 Stiffness of left shoulder, not elsewhere classified: Secondary | ICD-10-CM | POA: Insufficient documentation

## 2021-07-26 DIAGNOSIS — M25551 Pain in right hip: Secondary | ICD-10-CM | POA: Diagnosis present

## 2021-07-26 DIAGNOSIS — M79601 Pain in right arm: Secondary | ICD-10-CM | POA: Diagnosis present

## 2021-07-26 DIAGNOSIS — M79602 Pain in left arm: Secondary | ICD-10-CM | POA: Diagnosis present

## 2021-07-26 DIAGNOSIS — M25611 Stiffness of right shoulder, not elsewhere classified: Secondary | ICD-10-CM | POA: Diagnosis present

## 2021-07-26 DIAGNOSIS — M25552 Pain in left hip: Secondary | ICD-10-CM

## 2021-07-26 DIAGNOSIS — R262 Difficulty in walking, not elsewhere classified: Secondary | ICD-10-CM | POA: Diagnosis present

## 2021-07-26 DIAGNOSIS — M6281 Muscle weakness (generalized): Secondary | ICD-10-CM
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
## 2021-07-31 ENCOUNTER — Other Ambulatory Visit: Payer: Self-pay

## 2021-07-31 ENCOUNTER — Ambulatory Visit (INDEPENDENT_AMBULATORY_CARE_PROVIDER_SITE_OTHER): Payer: Medicare Other | Admitting: Family Medicine

## 2021-07-31 ENCOUNTER — Encounter: Payer: Self-pay | Admitting: Family Medicine

## 2021-07-31 ENCOUNTER — Ambulatory Visit: Payer: Medicare Other

## 2021-07-31 VITALS — BP 114/77 | HR 73 | Ht 68.0 in | Wt 165.4 lb

## 2021-07-31 DIAGNOSIS — M25551 Pain in right hip: Secondary | ICD-10-CM

## 2021-07-31 DIAGNOSIS — Z23 Encounter for immunization: Secondary | ICD-10-CM | POA: Diagnosis not present

## 2021-07-31 DIAGNOSIS — M79601 Pain in right arm: Secondary | ICD-10-CM

## 2021-07-31 DIAGNOSIS — H9201 Otalgia, right ear: Secondary | ICD-10-CM | POA: Diagnosis not present

## 2021-07-31 DIAGNOSIS — R262 Difficulty in walking, not elsewhere classified: Secondary | ICD-10-CM

## 2021-07-31 DIAGNOSIS — H9203 Otalgia, bilateral: Secondary | ICD-10-CM

## 2021-07-31 DIAGNOSIS — M6281 Muscle weakness (generalized): Secondary | ICD-10-CM | POA: Diagnosis not present

## 2021-07-31 DIAGNOSIS — M25611 Stiffness of right shoulder, not elsewhere classified: Secondary | ICD-10-CM

## 2021-07-31 DIAGNOSIS — M25552 Pain in left hip: Secondary | ICD-10-CM

## 2021-07-31 MED ORDER — DEBROX 6.5 % OT SOLN: 5.0000 [drp] | Freq: Two times a day (BID) | OTIC | 0 refills | Status: DC
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
## 2021-07-31 NOTE — Patient Instructions (Addendum)
It was great seeing you today!  Today we discussed your ear ache. Please take debrox as we discussed. Please make sure to go see the dentist soon as this pain could be related to your teeth.   I have also sent a prescription for Shingrix. Please know that it can sometimes make you feel crummy, so we recommend getting it when there isn't something important the next day.  Please follow up at your next scheduled appointment in 1-2 months, if anything arises between now and then, please don't hesitate to contact our office.   Thank you for allowing [place] to be a part of your medical care!  Thank you, Dr. Larae Grooms

## 2021-07-31 NOTE — Progress Notes (Signed)
    SUBJECTIVE:   CHIEF COMPLAINT / HPI:   Megan Kemp presented today for a follow up on ear pain.  She endorses pain on the right ear that has persisted despite a recent course of amoxicillin. Megan Kemp says it got better with amoxicillin but has now worsened since stopping. At her last visit a few weeks ago, irrigation was performed to remove earwax which offered some relief as well. She has had this pain prior 6 months ago and found relief with ear wax removal. Denies any associated trauma or injury. Denies fever, chills, nausea, vomiting or other associated symptoms.   She denies nausea, vomiting and fever.   PERTINENT  PMH / PSH:  Vertigo Depression Hypertension   OBJECTIVE:   BP 114/77   Pulse 73   Ht '5\' 8"'$  (1.727 m)   Wt 165 lb 6.4 oz (75 kg)   LMP 08/15/2011   SpO2 100%   BMI 25.15 kg/m   GEN: Well appearing, in no acute distress but bothered by ear. HEENT: Normocephalic, atraumatic. Tympanic membranes pearly and opaque, view slightly obstructed by earwax. No signs of purulent discharge. Oral mucosa pink, no signs of infection of cavity on gross exam. No tenderness near temples or clicking with movement of jaw. CARD: Normal rhythm and rate, no murmurs or gallops PULM: Clear to auscultation, normal work of breathing SKIN: Warm and dry, no rashes or lesions noted   ASSESSMENT/PLAN:   Otalgia of right ear Per exam, infectious source is unlikely. Discussed visiting dentist to evaluate for possible referred jaw pain. If pain persists, will consider ENT referral. Sent prescription for Debrox to remove built up ear wax and address eustachian tube dysfunction.  No concern for temporal arteritis at this time.    Health Maintenance Ensured physical therapy referral is still active. Sent prescription for Shingrix. Declines COVID booster for this visit. PHQ-9 score of 8 with negative question 9.   Megan Kemp, Medical Student   I was personally present and  performed or re-performed the history, physical exam and medical decision making activities of this service and have verified that the service and findings are accurately documented in the student's note.  Donney Dice, DO                  07/31/2021, 6:34 PM  [place]

## 2021-07-31 NOTE — Progress Notes (Signed)
    SUBJECTIVE:   CHIEF COMPLAINT / HPI:   Megan Kemp presented today for a follow up on ear pain.  She endorses pain on the right ear that has persisted despite a recent course of amoxicillin. Ms. Megan Kemp says it got better with amoxicillin but has now worsened since stopping. At her last visit a few weeks ago, irrigation was performed to remove earwax which offered some relief as well. She has had this pain prior 6 months ago and found relief with ear wax removal.  She denies nausea, vomiting and fever.   PERTINENT  PMH / PSH:  Vertigo Depression Hypertension   OBJECTIVE:   BP 114/77   Pulse 73   Ht '5\' 8"'$  (1.727 m)   Wt 165 lb 6.4 oz (75 kg)   LMP 08/15/2011   SpO2 100%   BMI 25.15 kg/m   GEN: Well appearing, in no acute distress but bothered by ear. HEENT: Normocephalic, atraumatic. Tympanic membranes pearly and opaque, view slightly obstructed by earwax. No signs of purulent discharge. Oral mucosa pink, no signs of infection of cavity on gross exam. No tenderness near temples or clicking with movement of jaw. CARD: Normal rhythm and rate, no murmurs or gallops PULM: Clear to auscultation, normal work of breathing SKIN: Warm and dry   ASSESSMENT/PLAN:   Otalgia of right ear Per exam, infectious source is unlikely. Discussed visiting dentist to evaluate for possible referred jaw pain. If pain persists, will consider ENT referral. Sent prescription for Debrox to remove built up ear wax and address eustachian tube dysfunction.  No concern for temporal arteritis at this time.    Health Maintenance Ensured physical therapy referral is still active. Sent prescription for Shingrix. Declines COVID booster for this visit.  [place]

## 2021-07-31 NOTE — Assessment & Plan Note (Signed)
Per exam, infectious source is unlikely. Discussed visiting dentist to evaluate for possible referred jaw pain. If pain persists, will consider ENT referral. Sent prescription for Debrox to remove built up ear wax and address eustachian tube dysfunction.  No concern for temporal arteritis at this time.

## 2021-08-07 ENCOUNTER — Other Ambulatory Visit: Payer: Self-pay

## 2021-08-07 ENCOUNTER — Ambulatory Visit (HOSPITAL_BASED_OUTPATIENT_CLINIC_OR_DEPARTMENT_OTHER): Payer: Medicare Other | Admitting: Physical Therapy

## 2021-08-07 ENCOUNTER — Encounter (HOSPITAL_BASED_OUTPATIENT_CLINIC_OR_DEPARTMENT_OTHER): Payer: Self-pay | Admitting: Physical Therapy

## 2021-08-07 DIAGNOSIS — M6281 Muscle weakness (generalized): Secondary | ICD-10-CM

## 2021-08-07 DIAGNOSIS — M25552 Pain in left hip: Secondary | ICD-10-CM

## 2021-08-07 DIAGNOSIS — M25551 Pain in right hip: Secondary | ICD-10-CM

## 2021-08-07 DIAGNOSIS — M25611 Stiffness of right shoulder, not elsewhere classified: Secondary | ICD-10-CM

## 2021-08-07 DIAGNOSIS — M79601 Pain in right arm: Secondary | ICD-10-CM

## 2021-08-07 DIAGNOSIS — R262 Difficulty in walking, not elsewhere classified: Secondary | ICD-10-CM
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
## 2021-08-14 ENCOUNTER — Ambulatory Visit: Payer: Medicare Other

## 2021-08-21 ENCOUNTER — Other Ambulatory Visit: Payer: Self-pay

## 2021-08-21 ENCOUNTER — Ambulatory Visit: Payer: Medicare Other

## 2021-08-21 DIAGNOSIS — M25612 Stiffness of left shoulder, not elsewhere classified: Secondary | ICD-10-CM

## 2021-08-21 DIAGNOSIS — M79601 Pain in right arm: Secondary | ICD-10-CM

## 2021-08-21 DIAGNOSIS — M25551 Pain in right hip: Secondary | ICD-10-CM

## 2021-08-21 DIAGNOSIS — M25552 Pain in left hip: Secondary | ICD-10-CM

## 2021-08-21 DIAGNOSIS — R262 Difficulty in walking, not elsewhere classified: Secondary | ICD-10-CM

## 2021-08-21 DIAGNOSIS — M6281 Muscle weakness (generalized): Secondary | ICD-10-CM | POA: Diagnosis not present

## 2021-08-21 DIAGNOSIS — M25611 Stiffness of right shoulder, not elsewhere classified: Secondary | ICD-10-CM
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
## 2021-08-29 ENCOUNTER — Ambulatory Visit: Payer: Medicare Other | Attending: Family Medicine

## 2021-08-29 ENCOUNTER — Other Ambulatory Visit: Payer: Self-pay

## 2021-08-29 DIAGNOSIS — M79601 Pain in right arm: Secondary | ICD-10-CM | POA: Insufficient documentation

## 2021-08-29 DIAGNOSIS — M25552 Pain in left hip: Secondary | ICD-10-CM | POA: Diagnosis present

## 2021-08-29 DIAGNOSIS — M6281 Muscle weakness (generalized): Secondary | ICD-10-CM | POA: Insufficient documentation

## 2021-08-29 DIAGNOSIS — R262 Difficulty in walking, not elsewhere classified: Secondary | ICD-10-CM | POA: Insufficient documentation

## 2021-08-29 DIAGNOSIS — M79602 Pain in left arm: Secondary | ICD-10-CM | POA: Insufficient documentation

## 2021-08-29 DIAGNOSIS — M25551 Pain in right hip: Secondary | ICD-10-CM | POA: Diagnosis present

## 2021-08-29 DIAGNOSIS — M25611 Stiffness of right shoulder, not elsewhere classified: Secondary | ICD-10-CM | POA: Insufficient documentation

## 2021-08-29 DIAGNOSIS — M25612 Stiffness of left shoulder, not elsewhere classified: Secondary | ICD-10-CM | POA: Diagnosis present
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
## 2021-09-04 ENCOUNTER — Ambulatory Visit (HOSPITAL_BASED_OUTPATIENT_CLINIC_OR_DEPARTMENT_OTHER): Payer: Medicare Other | Admitting: Physical Therapy

## 2021-09-06 ENCOUNTER — Ambulatory Visit: Payer: Medicare Other

## 2021-09-06 ENCOUNTER — Other Ambulatory Visit: Payer: Self-pay

## 2021-09-06 DIAGNOSIS — M25551 Pain in right hip: Secondary | ICD-10-CM

## 2021-09-06 DIAGNOSIS — M25552 Pain in left hip: Secondary | ICD-10-CM

## 2021-09-06 DIAGNOSIS — M6281 Muscle weakness (generalized): Secondary | ICD-10-CM

## 2021-09-06 DIAGNOSIS — M25612 Stiffness of left shoulder, not elsewhere classified: Secondary | ICD-10-CM

## 2021-09-06 DIAGNOSIS — R262 Difficulty in walking, not elsewhere classified: Secondary | ICD-10-CM

## 2021-09-06 DIAGNOSIS — M25611 Stiffness of right shoulder, not elsewhere classified: Secondary | ICD-10-CM

## 2021-09-06 DIAGNOSIS — M79601 Pain in right arm: Secondary | ICD-10-CM
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
## 2021-09-11 ENCOUNTER — Other Ambulatory Visit: Payer: Self-pay

## 2021-09-11 ENCOUNTER — Ambulatory Visit (HOSPITAL_BASED_OUTPATIENT_CLINIC_OR_DEPARTMENT_OTHER): Payer: Medicare Other | Attending: Family Medicine | Admitting: Physical Therapy

## 2021-09-11 DIAGNOSIS — M79602 Pain in left arm: Secondary | ICD-10-CM | POA: Insufficient documentation

## 2021-09-11 DIAGNOSIS — R262 Difficulty in walking, not elsewhere classified: Secondary | ICD-10-CM | POA: Insufficient documentation

## 2021-09-11 DIAGNOSIS — M79601 Pain in right arm: Secondary | ICD-10-CM | POA: Insufficient documentation

## 2021-09-11 DIAGNOSIS — M6281 Muscle weakness (generalized): Secondary | ICD-10-CM | POA: Insufficient documentation

## 2021-09-11 DIAGNOSIS — M25552 Pain in left hip: Secondary | ICD-10-CM | POA: Insufficient documentation

## 2021-09-11 DIAGNOSIS — M25551 Pain in right hip: Secondary | ICD-10-CM | POA: Insufficient documentation

## 2021-09-11 DIAGNOSIS — M25612 Stiffness of left shoulder, not elsewhere classified: Secondary | ICD-10-CM | POA: Insufficient documentation

## 2021-09-11 DIAGNOSIS — M2550 Pain in unspecified joint: Secondary | ICD-10-CM

## 2021-09-11 DIAGNOSIS — M25611 Stiffness of right shoulder, not elsewhere classified: Secondary | ICD-10-CM | POA: Insufficient documentation

## 2021-09-11 MED ORDER — DICLOFENAC SODIUM 1 % EX GEL: 4.0000 g | Freq: Four times a day (QID) | CUTANEOUS | 1 refills | Status: DC

## 2021-09-13 ENCOUNTER — Other Ambulatory Visit: Payer: Self-pay

## 2021-09-13 ENCOUNTER — Ambulatory Visit: Payer: Medicare Other

## 2021-09-13 DIAGNOSIS — M6281 Muscle weakness (generalized): Secondary | ICD-10-CM | POA: Diagnosis not present

## 2021-09-13 DIAGNOSIS — M79602 Pain in left arm: Secondary | ICD-10-CM

## 2021-09-13 DIAGNOSIS — M25612 Stiffness of left shoulder, not elsewhere classified: Secondary | ICD-10-CM

## 2021-09-13 DIAGNOSIS — M25551 Pain in right hip: Secondary | ICD-10-CM

## 2021-09-13 DIAGNOSIS — R262 Difficulty in walking, not elsewhere classified: Secondary | ICD-10-CM

## 2021-09-13 DIAGNOSIS — M79601 Pain in right arm: Secondary | ICD-10-CM

## 2021-09-13 DIAGNOSIS — M25552 Pain in left hip: Secondary | ICD-10-CM
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
## 2021-09-18 ENCOUNTER — Ambulatory Visit (HOSPITAL_BASED_OUTPATIENT_CLINIC_OR_DEPARTMENT_OTHER): Payer: Medicare Other | Admitting: Physical Therapy

## 2021-09-25 ENCOUNTER — Ambulatory Visit (HOSPITAL_BASED_OUTPATIENT_CLINIC_OR_DEPARTMENT_OTHER): Payer: Medicare Other | Admitting: Physical Therapy

## 2021-10-01 ENCOUNTER — Ambulatory Visit: Payer: Medicare Other | Admitting: Family Medicine

## 2021-10-03 ENCOUNTER — Other Ambulatory Visit: Payer: Self-pay

## 2021-10-03 ENCOUNTER — Ambulatory Visit: Payer: Medicare Other | Attending: Family Medicine

## 2021-10-03 DIAGNOSIS — M79601 Pain in right arm: Secondary | ICD-10-CM | POA: Diagnosis present

## 2021-10-03 DIAGNOSIS — M6281 Muscle weakness (generalized): Secondary | ICD-10-CM

## 2021-10-03 DIAGNOSIS — M25611 Stiffness of right shoulder, not elsewhere classified: Secondary | ICD-10-CM | POA: Diagnosis present

## 2021-10-03 DIAGNOSIS — M79602 Pain in left arm: Secondary | ICD-10-CM | POA: Insufficient documentation

## 2021-10-03 DIAGNOSIS — R262 Difficulty in walking, not elsewhere classified: Secondary | ICD-10-CM | POA: Diagnosis present

## 2021-10-03 DIAGNOSIS — M25612 Stiffness of left shoulder, not elsewhere classified: Secondary | ICD-10-CM | POA: Diagnosis present

## 2021-10-03 DIAGNOSIS — M25552 Pain in left hip: Secondary | ICD-10-CM | POA: Insufficient documentation

## 2021-10-03 DIAGNOSIS — M25551 Pain in right hip: Secondary | ICD-10-CM | POA: Diagnosis present
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
## 2021-10-16 ENCOUNTER — Other Ambulatory Visit: Payer: Self-pay

## 2021-10-16 ENCOUNTER — Encounter: Payer: Self-pay | Admitting: Family Medicine

## 2021-10-16 ENCOUNTER — Ambulatory Visit (INDEPENDENT_AMBULATORY_CARE_PROVIDER_SITE_OTHER): Payer: Medicare Other | Admitting: Family Medicine

## 2021-10-16 VITALS — BP 132/92 | HR 65 | Ht 68.0 in | Wt 164.8 lb

## 2021-10-16 DIAGNOSIS — R52 Pain, unspecified: Secondary | ICD-10-CM | POA: Diagnosis not present

## 2021-10-16 DIAGNOSIS — M797 Fibromyalgia: Secondary | ICD-10-CM | POA: Insufficient documentation

## 2021-10-16 DIAGNOSIS — M2550 Pain in unspecified joint: Secondary | ICD-10-CM

## 2021-10-16 DIAGNOSIS — F32A Depression, unspecified: Secondary | ICD-10-CM

## 2021-10-16 MED ORDER — DICLOFENAC SODIUM 1 % EX GEL: 4.0000 g | Freq: Four times a day (QID) | CUTANEOUS | 1 refills | Status: DC

## 2021-10-16 MED ORDER — DULOXETINE HCL 30 MG PO CPEP: 30.0000 mg | ORAL_CAPSULE | Freq: Every day | ORAL | 1 refills | Status: DC

## 2021-10-16 NOTE — Assessment & Plan Note (Signed)
-  given generalized, non-specific pattern of pain, likely secondary to fibromyalgia, can also have a somatic component -stretches provided -continue PT as patient already participating  -cymbalta  -follow up in 2 months for mood check

## 2021-10-16 NOTE — Patient Instructions (Signed)
It was great seeing you today!  I am sorry you have not been feeling well recently and have had a lot going on. Please restart cymbalta daily. Please choose a therapist from below and establish care at your earliest convenience. I believe that your mood is contributing to the pain that you are experiencing and I believe these things should help.   Please follow up at your next scheduled appointment in 2 months, if anything arises between now and then, please don't hesitate to contact our office.   Thank you for allowing [place] to be a part of your medical care!  Thank you, Dr. Larae Grooms    Therapy and Counseling Resources Most providers on this list will take Medicaid. Patients with commercial insurance or Medicare should contact their insurance company to get a list of in network providers.  [facility]:Psychiatry and Counseling [address] [facility] [place]. [place], [place] [address] [place], [place], [place] [address]      [place] [address]  [place], [place] [address] [phone]  [place] [address] [place]  [place], [place] [address]       [phone]     [facility] (virtual only) [phone]  Jinny Blossom [facility] [address] [place], [place]  [facility]:  [place]. [place] [phone]  [facility]:  [place] [address] [phone]  [facility] (under & uninsured) [address], [place] [phone]    [email or web address]    [place] [address]  [place]    [phone]  [facility]     Phone:  [phone]     [place]  [place] [address]      [place], [place] ext  [place]: [place], [place], [place]   [place] ([place] therapist) [email or web address]  [place] [address]   [place] [address]    [phone]    [facility]   [address],  [place], [place]   [place]  [place]  [facility]  [address] [place], [place]        [phone]  Open Access/Walk In Clinic under & uninsured  [facility]  [address] [place], [place] Crisis [phone]  [facility] of the [place],  ([place])   [place], [place]: [phone]) 8:30 - 12; 1 - 2:30  Family Service of the [place],  [place], [place]    [phone]):8:30 - 12; 2 - 3PM  [facility] [employer],  [address],  [place]; [phone]):   Mon - Fri 8 AM - 5 PM  [facility]  MWF 12:30 to 3:00 or call to schedule an appointment  [phone]  Specific Provider options Psychology Today  [email or web address] click on find a therapist  enter your zip code left side and select or tailor a therapist for your specific need.   [facility] Provider Directory [email or web address]  (Medicaid)   Follow all drop down to find  a provider  [place]) [phone] or [email or web address] [address], [place], [place] Recovery support and educational   24- Hour Availability:   [facility]  [address] [place], [place] Crisis [phone]  Family Service of the [employer] [phone]  [place]  [phone]   [place]  [phone] (after hours)  Therapeutic Alternative/Mobile Crisis    [phone]  [place] National Suicide Hotline  [phone] Diamantina Monks)  Call 911 or go to emergency room  [facility]  [phone]);  [place] and [employer]  [phone]); [place], [place], [place], [place], [place], [place], [place]

## 2021-10-16 NOTE — Progress Notes (Addendum)
    SUBJECTIVE:   CHIEF COMPLAINT / HPI:   Patient presents with generalized pain, reports that she has lost 3 friends in the past 3 weeks, she is unable to see her grandchildren due to custody issues and is still grieving the passing away of her sister 18 months ago. She states that all of these things have greatly affected her mood and she has put her needs off due to these ongoing occurrences. Was previously prescribed cymbalta but stopped taking it for unknown reasons. Has tried to go to therapy but her insurance would not cover it, she is very open to anything that can help. Endorsing hand, shoulder, knee and generalized pain, nonspecific pain. Has been taking gabapentin for awhile which she reports helps her pain. When asked about her support system, she reports God. States that she does not have many friends as people seem to not like her. She tends to keep to herself and is close to her son.    OBJECTIVE:   BP (!) 132/92   Pulse 65   Ht 5\' 8"  (1.727 m)   Wt 164 lb 12.8 oz (74.8 kg)   LMP 08/15/2011   SpO2 100%   BMI 25.06 kg/m   General: Patient in no acute distress. CV: RRR, no murmurs or gallops auscultated Resp: CTAB Ext: radial pulses strong and equal bilaterally, multiple tender points including along shoulders, knees and elbows bilaterally  Neuro: normal gait  Psych: intermittently tearful throughout encounter, denies SI, without plan or intent   ASSESSMENT/PLAN:   Depression -PHQ-9 score of 8 with negative question 9 reviewed and discussed  -mood likely contributing to generalized pain -restarted cymbalta, refill provided -list of therapists provided, instructed to establish care at earliest convenience -reassurance provided   Fibromyalgia -given generalized, non-specific pattern of pain, likely secondary to fibromyalgia, can also have a somatic component -stretches provided -continue PT as patient already participating  -cymbalta  -follow up in 2 months for  mood check     Zayla Agar Larae Grooms, [place]

## 2021-10-16 NOTE — Assessment & Plan Note (Addendum)
-  PHQ-9 score of 8 with negative question 9 reviewed and discussed  -mood likely contributing to generalized pain -restarted cymbalta, refill provided -list of therapists provided, instructed to establish care at earliest convenience -reassurance provided

## 2021-10-30 ENCOUNTER — Telehealth: Payer: Self-pay | Admitting: *Deleted

## 2021-10-30 NOTE — Telephone Encounter (Signed)
Patient called requesting a referral for physical therapy.  Will forward to MD.  Patient states that this was discussed at her last visit.  Takoda Janowiak,CMA

## 2021-10-31 ENCOUNTER — Other Ambulatory Visit: Payer: Self-pay | Admitting: Family Medicine

## 2021-10-31 DIAGNOSIS — M797 Fibromyalgia: Secondary | ICD-10-CM

## 2021-10-31 DIAGNOSIS — M2550 Pain in unspecified joint: Secondary | ICD-10-CM

## 2021-10-31 DIAGNOSIS — R52 Pain, unspecified: Secondary | ICD-10-CM

## 2021-11-12 ENCOUNTER — Telehealth: Payer: Self-pay

## 2021-11-12 NOTE — Telephone Encounter (Signed)
Patient calls nurse line regarding nose bleed that occurred this morning. Patient states that she woke up and blew her nose and there was blood in the tissue. Bleeding was short term and resolved within a few minutes.   Advised of supportive measures with colder, dryer weather.   Return precautions given.   Talbot Grumbling, RN

## 2021-11-23 ENCOUNTER — Encounter: Payer: Self-pay | Admitting: *Deleted

## 2021-11-23 ENCOUNTER — Telehealth: Payer: Self-pay | Admitting: *Deleted

## 2021-11-23 NOTE — Telephone Encounter (Signed)
Pt wants a medication sent in for swimmers ear. Please advise and call pt to discuss. Megan Kemp Kennon Holter, CMA

## 2021-11-30 NOTE — Telephone Encounter (Signed)
Spoke with patient made appt for 12/15 at 3:50. Patient stated that she has an appt at her [place] on 12/12. That she will still keep the 12/15 appt with Dr. Larae Grooms if she is still having the ear pain after she see the Ear doctor on 12/12. Salvatore Marvel, CMA

## 2021-12-06 ENCOUNTER — Ambulatory Visit (INDEPENDENT_AMBULATORY_CARE_PROVIDER_SITE_OTHER): Payer: Medicare Other | Admitting: Family Medicine

## 2021-12-06 ENCOUNTER — Encounter: Payer: Self-pay | Admitting: Family Medicine

## 2021-12-06 ENCOUNTER — Other Ambulatory Visit: Payer: Self-pay

## 2021-12-06 VITALS — BP 121/91 | HR 66 | Ht 68.0 in | Wt 165.1 lb

## 2021-12-06 DIAGNOSIS — M2550 Pain in unspecified joint: Secondary | ICD-10-CM | POA: Diagnosis not present

## 2021-12-06 DIAGNOSIS — H6122 Impacted cerumen, left ear: Secondary | ICD-10-CM | POA: Diagnosis not present

## 2021-12-06 DIAGNOSIS — H6120 Impacted cerumen, unspecified ear: Secondary | ICD-10-CM | POA: Insufficient documentation

## 2021-12-06 MED ORDER — DICLOFENAC SODIUM 1 % EX GEL: 4.0000 g | Freq: Four times a day (QID) | CUTANEOUS | 6 refills | Status: DC

## 2021-12-06 NOTE — Progress Notes (Addendum)
° ° °  SUBJECTIVE:   CHIEF COMPLAINT / HPI:   Patient with history of vertigo presents for left ear pain, started about 1 month ago but started to notice an imbalance since she saw the ENT. Went to ENT on 12/12 who did see anything in her ear, they irrigated her right ear and instructed to use a humidifier which she will be going today to get one. They looked into the left ear and did not see anything. Endorsing pain inside the ear, feels like her ears are popping. Denies drainage or foreign body with occasional tinnitus which has been occurring years.   OBJECTIVE:   BP (!) 121/91    Pulse 66    Ht 5\' 8"  (1.727 m)    Wt 165 lb 2 oz (74.9 kg)    LMP 08/15/2011    SpO2 100%    BMI 25.11 kg/m   General: Patient well-appearing, in no acute distress. 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

## 2021-12-06 NOTE — Patient Instructions (Addendum)
It was great seeing you today!  Today we discussed your ear pain, I am sorry this is bothering you. We will irrigate your ears, hoping this helps your pain and feelings of imbalance.   Please follow up at your next scheduled appointment in 6 months, if anything arises between now and then, please don't hesitate to contact our office.   Thank you for allowing [place] to be a part of your medical care!  Thank you, Dr. Larae Grooms

## 2021-12-06 NOTE — Assessment & Plan Note (Signed)
-  otalgia likely multifactorial with history of vertigo, current cerumen impaction affecting inner ear balance  -irrigation performed today -consider vestibular therapy if persists  -follow up in 6 months for wellness visit

## 2021-12-07 ENCOUNTER — Ambulatory Visit: Payer: Medicare Other | Admitting: Family Medicine

## 2021-12-10 ENCOUNTER — Ambulatory Visit: Payer: Medicare Other | Attending: Family Medicine
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
## 2021-12-24 ENCOUNTER — Ambulatory Visit (HOSPITAL_COMMUNITY)
Admission: EM | Admit: 2021-12-24 | Discharge: 2021-12-24 | Disposition: A | Discharge status: Home / Self Care | Payer: Commercial Managed Care - HMO | Attending: Internal Medicine | Admitting: Internal Medicine

## 2021-12-24 ENCOUNTER — Other Ambulatory Visit: Payer: Self-pay

## 2021-12-24 DIAGNOSIS — R1032 Left lower quadrant pain: Secondary | ICD-10-CM | POA: Insufficient documentation

## 2021-12-24 DIAGNOSIS — N760 Acute vaginitis: Secondary | ICD-10-CM | POA: Diagnosis not present

## 2021-12-24 DIAGNOSIS — R1031 Right lower quadrant pain: Secondary | ICD-10-CM | POA: Insufficient documentation

## 2021-12-24 MED ORDER — IBUPROFEN 600 MG PO TABS: 600.0000 mg | ORAL_TABLET | Freq: Three times a day (TID) | ORAL | 0 refills | Status: DC | PRN

## 2021-12-24 MED ORDER — KETOROLAC TROMETHAMINE 30 MG/ML IJ SOLN
INTRAMUSCULAR | Status: AC
  Filled 2021-12-24: qty 1

## 2021-12-24 MED ORDER — KETOROLAC TROMETHAMINE 30 MG/ML IJ SOLN
30.0000 mg | Freq: Once | INTRAMUSCULAR | Status: AC
  Administered 2021-12-24: 30 mg via INTRAMUSCULAR

## 2021-12-24 MED ORDER — FLUCONAZOLE 150 MG PO TABS: 150.0000 mg | ORAL_TABLET | Freq: Once | ORAL | 0 refills | Status: AC
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
## 2021-12-24 NOTE — ED Triage Notes (Signed)
Pt presents to the office for abdominal pain and vaginal discharge x 2 days. Pt stated she is not sexually active.

## 2021-12-24 NOTE — Discharge Instructions (Addendum)
Increase oral fluid intake Take medications as prescribed If symptoms worsen please return to urgent care to be reevaluated We will call you with recommendations if labs are abnormal.

## 2021-12-25 LAB — CERVICOVAGINAL ANCILLARY ONLY
Bacterial Vaginitis (gardnerella): NEGATIVE
Candida Glabrata: NEGATIVE
Candida Vaginitis: NEGATIVE
Comment: NEGATIVE
Comment: NEGATIVE
Comment: NEGATIVE

## 2022-01-08 ENCOUNTER — Other Ambulatory Visit: Payer: Self-pay | Admitting: Family Medicine

## 2022-01-08 DIAGNOSIS — Z1231 Encounter for screening mammogram for malignant neoplasm of breast: Secondary | ICD-10-CM

## 2022-02-15 ENCOUNTER — Ambulatory Visit
Admission: RE | Admit: 2022-02-15 | Discharge: 2022-02-15 | Disposition: A | Discharge status: Home / Self Care | Payer: Medicare Other | Source: Ambulatory Visit | Attending: Family Medicine | Admitting: Family Medicine

## 2022-02-15 DIAGNOSIS — Z1231 Encounter for screening mammogram for malignant neoplasm of breast: Secondary | ICD-10-CM

## 2022-02-19 ENCOUNTER — Encounter: Payer: Self-pay | Admitting: Family Medicine

## 2022-05-09 ENCOUNTER — Other Ambulatory Visit: Payer: Self-pay | Admitting: Family Medicine

## 2022-05-09 ENCOUNTER — Telehealth: Payer: Self-pay | Admitting: *Deleted

## 2022-05-09 DIAGNOSIS — Z1211 Encounter for screening for malignant neoplasm of colon: Secondary | ICD-10-CM

## 2022-05-09 NOTE — Telephone Encounter (Signed)
Patient called requesting a referral for her screening colonoscopy.  She goes to [facility] in [place] (fax [phone]).  Will forward to MD to place the referral.    Thanks [facility]

## 2022-05-23 ENCOUNTER — Telehealth: Payer: Self-pay | Admitting: *Deleted

## 2022-05-23 ENCOUNTER — Ambulatory Visit (INDEPENDENT_AMBULATORY_CARE_PROVIDER_SITE_OTHER): Payer: Medicare Other | Admitting: Family Medicine

## 2022-05-23 VITALS — BP 164/92 | HR 62 | Ht 68.0 in | Wt 163.4 lb

## 2022-05-23 DIAGNOSIS — I10 Essential (primary) hypertension: Secondary | ICD-10-CM

## 2022-05-23 DIAGNOSIS — R7303 Prediabetes: Secondary | ICD-10-CM

## 2022-05-23 DIAGNOSIS — G479 Sleep disorder, unspecified: Secondary | ICD-10-CM | POA: Diagnosis not present

## 2022-05-23 DIAGNOSIS — G4700 Insomnia, unspecified: Secondary | ICD-10-CM

## 2022-05-23 LAB — POCT GLYCOSYLATED HEMOGLOBIN (HGB A1C): HbA1c, POC (prediabetic range): 5.9 % (ref 5.7–6.4)

## 2022-05-23 MED ORDER — MELATONIN 3 MG PO TABS: 3.0000 mg | ORAL_TABLET | Freq: Every day | ORAL | 0 refills | Status: DC

## 2022-05-23 NOTE — Progress Notes (Signed)
    SUBJECTIVE:   CHIEF COMPLAINT / HPI:   Sleep issues  Patient presents today for numerous problems.  1 concern is sleep issues.  She reports that she goes to sleep around midnight or after and will only sleep 5 to 6 hours nightly.  Reports she has issues falling asleep.  Does not take any medications for this.  Does sleep with the TV on.  Reports that ringing in her years is 1 issue that keeps her up.  Prediabetes  Patient concerned about diabetes.  Reports she was prediabetic and previous check and would like her A1c checked.  Most recent hemoglobin A1c was approximately 6 months ago and was 5.9.  Elevated blood pressure Patient presenting today with elevated blood pressure.  Denies any issues with blood pressure in the past.  Reports that she has never been on any blood pressure medications.  In chart review patient does have history of hypertension.  Denies any symptoms such as headaches, blurry vision, chest pain, shortness of breath.  OBJECTIVE:   BP (!) 164/92   Pulse 62   Ht '5\' 8"'$  (1.727 m)   Wt 163 lb 6.4 oz (74.1 kg)   LMP 08/15/2011   SpO2 100%   BMI 24.84 kg/m   General: Pleasant 61 year old female in no acute distress HEENT: Moist mucous membranes, external auditory canals normal bilaterally, TMs normal bilaterally Cardiac: Regular rate and rhythm Respiratory: Normal work of breathing, speaking in full sentences, lungs clear to auscultation bilaterally MSK: No gross abnormalities   ASSESSMENT/PLAN:   Prediabetes A1c currently well controlled.  Continue dietary changes.  No medications needed at this time.  Insomnia Patient complaining of difficulty sleeping.  Longstanding history of insomnia.  Has poor sleep schedule as well as hygiene.  Discussed sleep hygiene.  She is also having issues with tinnitus and recommended white noise machine to help with this.  Prescribed melatonin for the patient.  Return precautions given.  HTN (hypertension) Patient's elevated  blood pressure today.  Repeat remains elevated.  Patient reports is likely due to to being upset regarding how she is feeling in her sleep.  Not currently on any medications.  We will follow-up in 1 week for repeat blood pressure check and consider initiation of antihypertensive medications at that time.     Gifford Shave, MD [place]

## 2022-05-23 NOTE — Telephone Encounter (Signed)
Opened in error.Adair Lauderback Zimmerman Rumple, CMA  

## 2022-05-23 NOTE — Patient Instructions (Signed)
It was good seeing you today.  Your hemoglobin A1c checking for diabetes was stable at 5.9.  There is no need for treatment at this time.  Regarding your sleep concerns I sent a prescription for melatonin to your pharmacy.  You can take this nightly 30 minutes before you go to bed.  Your blood pressure was elevated today.  I would like you to come back in 1 week for repeat blood pressure check and may be initiation of blood pressure medications at that time.  If you have any questions or concerns please call the clinic.  Hope you have a wonderful day!

## 2022-05-24 DIAGNOSIS — G479 Sleep disorder, unspecified: Secondary | ICD-10-CM | POA: Insufficient documentation

## 2022-05-24 NOTE — Assessment & Plan Note (Signed)
A1c currently well controlled.  Continue dietary changes.  No medications needed at this time.

## 2022-05-24 NOTE — Assessment & Plan Note (Signed)
Patient complaining of difficulty sleeping.  Longstanding history of insomnia.  Has poor sleep schedule as well as hygiene.  Discussed sleep hygiene.  She is also having issues with tinnitus and recommended white noise machine to help with this.  Prescribed melatonin for the patient.  Return precautions given.

## 2022-05-24 NOTE — Assessment & Plan Note (Signed)
Patient's elevated blood pressure today.  Repeat remains elevated.  Patient reports is likely due to to being upset regarding how she is feeling in her sleep.  Not currently on any medications.  We will follow-up in 1 week for repeat blood pressure check and consider initiation of antihypertensive medications at that time.

## 2022-05-30 ENCOUNTER — Encounter: Payer: Self-pay | Admitting: Family Medicine

## 2022-05-30 ENCOUNTER — Ambulatory Visit (INDEPENDENT_AMBULATORY_CARE_PROVIDER_SITE_OTHER): Payer: Medicare Other | Admitting: Family Medicine

## 2022-05-30 VITALS — BP 147/89 | HR 83 | Ht 68.0 in | Wt 162.0 lb

## 2022-05-30 DIAGNOSIS — I10 Essential (primary) hypertension: Secondary | ICD-10-CM

## 2022-05-30 MED ORDER — AMLODIPINE-OLMESARTAN 5-40 MG PO TABS: 1.0000 | ORAL_TABLET | Freq: Every day | ORAL | 3 refills | Status: DC

## 2022-05-30 MED ORDER — AMLODIPINE BESYLATE 5 MG PO TABS: 5.0000 mg | ORAL_TABLET | Freq: Every day | ORAL | 3 refills | Status: DC

## 2022-05-30 NOTE — Patient Instructions (Signed)
It was wonderful seeing you today!  Your blood pressure is elevated today and I believe you need a blood pressure medication.  I want to start you on a low-dose combination blood pressure medication which should hopefully get you to goal.  Like for you to take this daily and I will see you in 2 weeks for a follow-up to see if we need to make any changes.  I do want to check some lab work today.  I will call you with the results if there are any abnormalities or send you a MyChart message if everything looks okay.  Please activate your MyChart.  I hope you have a wonderful afternoon!

## 2022-05-30 NOTE — Progress Notes (Signed)
    SUBJECTIVE:   CHIEF COMPLAINT / HPI:   BP follow up  Patient presents for evaluation for blood pressure follow-up.  She was recently seen and noted to have elevated blood pressure.  Patient has history of hypertension but discontinued medications when blood pressures came under control.  Denies any symptoms of hypertension such as headache, shortness of breath, change in vision, chest pain.  Has been working on dietary changes but does admit to she sometimes eats food she should know she should not.  OBJECTIVE:   BP (!) 147/89   Pulse 83   Ht '5\' 8"'$  (1.727 m)   Wt 162 lb (73.5 kg)   LMP 05/06/2011   SpO2 98%   BMI 24.63 kg/m   General: Pleasant 61 year old female, no acute distress Cardiac: Regular rate and rhythm, no murmurs appreciated Respiratory: Normal breathing, lungs clear to auscultation bilaterally Abdomen: Soft, nontender, positive bowel sounds MSK:No gross abnormalities, ambulates without difficulty   ASSESSMENT/PLAN:   HTN (hypertension) Patient's blood pressure remains elevated at today's visit.  Discussed dietary changes following the DASH diet.  Initiating amlodipine olmesartan combination therapy at lowest dose and plan on follow-up in 2 weeks for medication adjustment if needed.  Strict ED return precautions given.     Gifford Shave, MD [place]

## 2022-05-30 NOTE — Assessment & Plan Note (Signed)
Patient's blood pressure remains elevated at today's visit.  Discussed dietary changes following the DASH diet.  Initiating amlodipine olmesartan combination therapy at lowest dose and plan on follow-up in 2 weeks for medication adjustment if needed.  Strict ED return precautions given.

## 2022-05-31 LAB — BASIC METABOLIC PANEL
BUN/Creatinine Ratio: 17 (ref 12–28)
BUN: 15 mg/dL (ref 8–27)
CO2: 25 mmol/L (ref 20–29)
Calcium: 10.1 mg/dL (ref 8.7–10.3)
Chloride: 102 mmol/L (ref 96–106)
Creatinine, Ser: 0.86 mg/dL (ref 0.57–1.00)
Glucose: 103 mg/dL — ABNORMAL HIGH (ref 70–99)
Potassium: 4 mmol/L (ref 3.5–5.2)
Sodium: 140 mmol/L (ref 134–144)
eGFR: 77 mL/min/{1.73_m2} (ref >59)

## 2022-05-31 LAB — LIPID PANEL
Chol/HDL Ratio: 2.7 ratio (ref 0.0–4.4)
Cholesterol, Total: 159 mg/dL (ref 100–199)
HDL: 59 mg/dL (ref >39)
LDL Chol Calc (NIH): 91 mg/dL (ref 0–99)
Triglycerides: 42 mg/dL (ref 0–149)
VLDL Cholesterol Cal: 9 mg/dL (ref 5–40)

## 2022-06-03 ENCOUNTER — Encounter (HOSPITAL_COMMUNITY): Payer: Self-pay | Admitting: Emergency Medicine

## 2022-06-03 ENCOUNTER — Ambulatory Visit (HOSPITAL_COMMUNITY)
Admission: EM | Admit: 2022-06-03 | Discharge: 2022-06-03 | Disposition: A | Discharge status: Home / Self Care | Payer: Medicare Other | Attending: Family Medicine | Admitting: Family Medicine

## 2022-06-03 DIAGNOSIS — M542 Cervicalgia: Secondary | ICD-10-CM | POA: Diagnosis not present

## 2022-06-03 DIAGNOSIS — M549 Dorsalgia, unspecified: Secondary | ICD-10-CM

## 2022-06-03 DIAGNOSIS — M79605 Pain in left leg: Secondary | ICD-10-CM | POA: Diagnosis not present

## 2022-06-03 MED ORDER — KETOROLAC TROMETHAMINE 30 MG/ML IJ SOLN
INTRAMUSCULAR | Status: AC
  Filled 2022-06-03: qty 1

## 2022-06-03 MED ORDER — KETOROLAC TROMETHAMINE 30 MG/ML IJ SOLN
30.0000 mg | Freq: Once | INTRAMUSCULAR | Status: AC
Start: 2022-06-03 — End: 2022-06-03
  Administered 2022-06-03: 30 mg via INTRAMUSCULAR

## 2022-06-03 MED ORDER — IBUPROFEN 600 MG PO TABS: 600.0000 mg | ORAL_TABLET | Freq: Three times a day (TID) | ORAL | 0 refills | Status: DC | PRN

## 2022-06-03 MED ORDER — TIZANIDINE HCL 4 MG PO TABS: 4.0000 mg | ORAL_TABLET | Freq: Three times a day (TID) | ORAL | 0 refills | Status: DC | PRN

## 2022-06-03 NOTE — ED Triage Notes (Signed)
Pt fell at [facility] yesterday on spill on the floor. C/o neck, back and left leg pain.
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
## 2022-06-03 NOTE — Discharge Instructions (Addendum)
You have been given a shot of Toradol 30 mg today.  Take ibuprofen 600 mg--1 tab every 8 hours as needed for pain.

## 2022-06-17 ENCOUNTER — Telehealth: Payer: Self-pay | Admitting: Family Medicine

## 2022-06-17 NOTE — Telephone Encounter (Signed)
Patient dropped off form at front desk for [facility] [employer].  Verified that patient section of form has been completed.  Last DOS/WCC with PCP was 05/30/22.  Placed form in blue team folder to be completed by clinical staff.  Vilinda Blanks

## 2022-06-20 ENCOUNTER — Encounter: Payer: Self-pay | Admitting: Family Medicine

## 2022-06-20 ENCOUNTER — Ambulatory Visit (INDEPENDENT_AMBULATORY_CARE_PROVIDER_SITE_OTHER): Payer: Medicare Other | Admitting: Family Medicine

## 2022-06-20 VITALS — BP 133/86 | HR 68 | Ht 68.0 in | Wt 159.0 lb

## 2022-06-20 DIAGNOSIS — W19XXXA Unspecified fall, initial encounter: Secondary | ICD-10-CM | POA: Insufficient documentation

## 2022-06-20 DIAGNOSIS — W19XXXS Unspecified fall, sequela: Secondary | ICD-10-CM

## 2022-06-20 DIAGNOSIS — I10 Essential (primary) hypertension: Secondary | ICD-10-CM

## 2022-06-20 NOTE — Assessment & Plan Note (Signed)
Patient 2 weeks out from a fall where she slipped on wet floor at [facility] and caught her self on the wall in her body.  Did not actually hit anything on any objects or the floor.  Is still having considerable paraspinal tenderness and feels stiff.  She also has pain that radiates down her left leg.  Has not been taking any OTC medications although saw great benefit from Toradol injection that she received at the care.  Do not feel imaging is necessary at this time due to well-appearing nature of the patient.  No red flag symptoms such as loss of bowel or bladder function, weakness in lower extremities.  Do feel she may benefit from physical therapy and recommended use of over-the-counter medications such as Tylenol to help with the pain.  Patient will try this.  If symptoms persist consider imaging.  Return precautions given.

## 2022-06-20 NOTE — Assessment & Plan Note (Signed)
Blood pressure greatly improved on new antihypertensive regimen of amlodipine-olmesartan combination therapy.  We will continue this current therapy and continue to monitor blood pressures.  Checking a BMP today.  Strict return precautions given.

## 2022-06-20 NOTE — Progress Notes (Signed)
    SUBJECTIVE:   CHIEF COMPLAINT / HPI:   HTN follow up  Patient presents for hypertension follow-up.  Reports that she has been compliant with the medications that we started her on at the last visit and she feels like her blood pressures been doing better.  Denies any signs or symptoms of hypertension such as headaches, chest pain, shortness of breath.  Fall  Patient reports that she was at [facility] on 12 July when she was walking and slipped on a pool of water.  She reports that she did not see any signout saying that there were wet floors that the attendant was walking up with the sign when she slipped.  Reports that she slipped but did not hit anything when striking the floor or the body.  She did not have x-rays done at the urgent care.  She was having muscle pains and spasms.  Was taking muscle relaxant which she reports did not help much.  She did receive a shot of Toradol which helped greatly.  She has not been taking any over-the-counter medications due to fear that she is not allowed to to take them because of her new blood pressure medicines.   OBJECTIVE:   BP 133/86   Pulse 68   Ht '5\' 8"'$  (1.727 m)   Wt 159 lb (72.1 kg)   LMP 05/06/2011   BMI 24.18 kg/m   General: Pleasant, well-appearing 61 year old female Cardiac: Regular rate and rhythm Respiratory: Normal work of breathing, speaking full sentences, lungs clear to auscultation MSK: Paraspinal tenderness in cervical, thoracic, lumbar regions, muscular tenderness to right-sided chest, ambulating with a walker.  ASSESSMENT/PLAN:   Fall Patient 2 weeks out from a fall where she slipped on wet floor at [facility] and caught her self on the wall in her body.  Did not actually hit anything on any objects or the floor.  Is still having considerable paraspinal tenderness and feels stiff.  She also has pain that radiates down her left leg.  Has not been taking any OTC medications although saw great benefit from Toradol injection that  she received at the care.  Do not feel imaging is necessary at this time due to well-appearing nature of the patient.  No red flag symptoms such as loss of bowel or bladder function, weakness in lower extremities.  Do feel she may benefit from physical therapy and recommended use of over-the-counter medications such as Tylenol to help with the pain.  Patient will try this.  If symptoms persist consider imaging.  Return precautions given.  HTN (hypertension) Blood pressure greatly improved on new antihypertensive regimen of amlodipine-olmesartan combination therapy.  We will continue this current therapy and continue to monitor blood pressures.  Checking a BMP today.  Strict return precautions given.     Concepcion Living, MD [place]

## 2022-06-20 NOTE — Patient Instructions (Signed)
It was wonderful seeing you today!.  Your blood pressure looks great and I have no concerns.  I do want to check some blood work on you today because of the medications we started you on.  I want you to continue taking these medications.  Regarding your pain from the fall you can take Tylenol as needed for the pain which she can get over-the-counter.  If you have any questions or concerns please call the clinic or be reevaluated.  I hope you have a wonderful day!

## 2022-06-21 LAB — BASIC METABOLIC PANEL
BUN/Creatinine Ratio: 22 (ref 12–28)
BUN: 20 mg/dL (ref 8–27)
CO2: 23 mmol/L (ref 20–29)
Calcium: 9.7 mg/dL (ref 8.7–10.3)
Chloride: 103 mmol/L (ref 96–106)
Creatinine, Ser: 0.93 mg/dL (ref 0.57–1.00)
Glucose: 104 mg/dL — ABNORMAL HIGH (ref 70–99)
Potassium: 4.2 mmol/L (ref 3.5–5.2)
Sodium: 140 mmol/L (ref 134–144)
eGFR: 70 mL/min/{1.73_m2} (ref >59)

## 2022-06-21 NOTE — Telephone Encounter (Signed)
Patient called and informed that forms are ready for pick up. Copy made and placed in batch scanning. Original placed at front desk for pick up.   Karyl Sharrar C Irmalee Riemenschneider, RN  

## 2022-06-27 NOTE — Telephone Encounter (Signed)
Patient out front on Wednesday PM. Completed form given to patient.  Dorris Singh, MD  Family Medicine Teaching Service

## 2022-07-08 ENCOUNTER — Ambulatory Visit: Payer: Medicare Other | Attending: Family Medicine | Admitting: Physical Therapy

## 2022-07-08 ENCOUNTER — Encounter: Payer: Self-pay | Admitting: Physical Therapy

## 2022-07-08 VITALS — BP 120/80 | HR 67

## 2022-07-08 DIAGNOSIS — R2689 Other abnormalities of gait and mobility: Secondary | ICD-10-CM | POA: Diagnosis not present

## 2022-07-08 DIAGNOSIS — R296 Repeated falls: Secondary | ICD-10-CM | POA: Diagnosis not present

## 2022-07-08 DIAGNOSIS — M25552 Pain in left hip: Secondary | ICD-10-CM | POA: Diagnosis present

## 2022-07-08 DIAGNOSIS — R2681 Unsteadiness on feet: Secondary | ICD-10-CM

## 2022-07-08 DIAGNOSIS — M6281 Muscle weakness (generalized): Secondary | ICD-10-CM | POA: Diagnosis not present

## 2022-07-08 DIAGNOSIS — M5459 Other low back pain: Secondary | ICD-10-CM

## 2022-07-08 DIAGNOSIS — X58XXXA Exposure to other specified factors, initial encounter: Secondary | ICD-10-CM | POA: Diagnosis not present

## 2022-07-08 DIAGNOSIS — W19XXXS Unspecified fall, sequela: Secondary | ICD-10-CM | POA: Insufficient documentation

## 2022-07-08 DIAGNOSIS — M25512 Pain in left shoulder: Secondary | ICD-10-CM | POA: Diagnosis present
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
## 2022-07-09 ENCOUNTER — Telehealth: Payer: Self-pay | Admitting: Physical Therapy

## 2022-07-09 DIAGNOSIS — W19XXXS Unspecified fall, sequela: Secondary | ICD-10-CM

## 2022-07-09 NOTE — Telephone Encounter (Signed)
LVM for pt to call office back to inform her of below and let her know that she can go to either of the [facility] sites and just walk in, she doesn't need an appointment. Luisalberto Beegle Zimmerman Rumple, CMA

## 2022-07-09 NOTE — Telephone Encounter (Signed)
Order left hip and C spine x rays as requested.

## 2022-07-09 NOTE — Telephone Encounter (Signed)
Dr. Caron Presume, I saw Jarissa Sheriff regarding her recent fall and she endorsed severe pain at rest and with activity.  There was no single comparable sign, but increased intensity of pain around her left greater trochanter and C6/C7 junction.  My assessment was somewhat limited by pain and her fear of pain reproduction, but I was concerned that she is having worse pain at night particularly in her lateral thigh region and she reports being unable to reposition to return to sleep.  I also was unable to find a position of comfort for her today and she did have some rebound pain generally with palpation around the left hip and low back.  Her active cervical rotation to the left was limited to roughly 45 degrees with pain throughout the ROM.  She also reports that her pain has slowly worsened since the fall which was on June 12.  I wanted to reach out to you in the event you feel she would benefit from imaging to confirm no potential fractures or other soft tissue issues.  If you agree, please submit request in EPIC under MD Order, Other Orders (list specific imaging in comments) or fax to [facility] at [phone].   Thank you,  Elease Etienne, PT, [place] [address] [place], [place]  [address] Phone:  [phone] Fax:  [phone]

## 2022-07-10 ENCOUNTER — Encounter: Payer: Self-pay | Admitting: Physical Therapy

## 2022-07-10 ENCOUNTER — Ambulatory Visit: Payer: Medicare Other | Admitting: Physical Therapy

## 2022-07-10 ENCOUNTER — Ambulatory Visit
Admission: RE | Admit: 2022-07-10 | Discharge: 2022-07-10 | Disposition: A | Discharge status: Home / Self Care | Payer: Medicare Other | Source: Ambulatory Visit | Attending: Family Medicine | Admitting: Family Medicine

## 2022-07-10 DIAGNOSIS — M6281 Muscle weakness (generalized): Secondary | ICD-10-CM

## 2022-07-10 DIAGNOSIS — M25552 Pain in left hip: Secondary | ICD-10-CM

## 2022-07-10 DIAGNOSIS — R2689 Other abnormalities of gait and mobility: Secondary | ICD-10-CM

## 2022-07-10 DIAGNOSIS — R2681 Unsteadiness on feet: Secondary | ICD-10-CM

## 2022-07-10 DIAGNOSIS — M25512 Pain in left shoulder: Secondary | ICD-10-CM | POA: Diagnosis not present

## 2022-07-10 DIAGNOSIS — W19XXXS Unspecified fall, sequela: Secondary | ICD-10-CM
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
## 2022-07-11 ENCOUNTER — Other Ambulatory Visit: Payer: Self-pay | Admitting: Family Medicine

## 2022-07-11 DIAGNOSIS — W19XXXD Unspecified fall, subsequent encounter: Secondary | ICD-10-CM

## 2022-07-11 MED ORDER — TIZANIDINE HCL 4 MG PO TABS: 4.0000 mg | ORAL_TABLET | Freq: Every day | ORAL | 0 refills | Status: DC

## 2022-07-11 NOTE — Progress Notes (Signed)
Informed that X rays were negative for fracture and positive for degenerative changes.  Recommended she make a follow up appointment with her new PCP.

## 2022-07-11 NOTE — Telephone Encounter (Signed)
Checked pts chart and this imaging has been completed.Megan Kemp, CMA

## 2022-07-15 ENCOUNTER — Encounter: Payer: Self-pay | Admitting: Physical Therapy

## 2022-07-15 ENCOUNTER — Ambulatory Visit: Payer: Medicare Other | Admitting: Physical Therapy

## 2022-07-15 DIAGNOSIS — R2689 Other abnormalities of gait and mobility: Secondary | ICD-10-CM

## 2022-07-15 DIAGNOSIS — M6281 Muscle weakness (generalized): Secondary | ICD-10-CM

## 2022-07-15 DIAGNOSIS — M25512 Pain in left shoulder: Secondary | ICD-10-CM
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
## 2022-07-16 ENCOUNTER — Ambulatory Visit: Payer: Medicare Other | Admitting: Physical Therapy

## 2022-07-18 ENCOUNTER — Ambulatory Visit: Payer: Medicare Other | Admitting: Physical Therapy

## 2022-07-18 ENCOUNTER — Encounter: Payer: Self-pay | Admitting: Physical Therapy

## 2022-07-18 DIAGNOSIS — M25512 Pain in left shoulder: Secondary | ICD-10-CM | POA: Diagnosis not present

## 2022-07-18 DIAGNOSIS — R2681 Unsteadiness on feet: Secondary | ICD-10-CM

## 2022-07-18 DIAGNOSIS — M25552 Pain in left hip: Secondary | ICD-10-CM

## 2022-07-18 DIAGNOSIS — R2689 Other abnormalities of gait and mobility: Secondary | ICD-10-CM

## 2022-07-18 DIAGNOSIS — W19XXXS Unspecified fall, sequela: Secondary | ICD-10-CM

## 2022-07-18 DIAGNOSIS — M6281 Muscle weakness (generalized): Secondary | ICD-10-CM
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
## 2022-07-18 NOTE — Patient Instructions (Signed)
Access Code: [email or web address] URL: [email or web address] Date: 07/18/2022 Prepared by: Elease Etienne  Exercises - Supine Bridge  - 1 x daily - 5 x weekly - 2 sets - 12 reps - 1 second hold - Active Straight Leg Raise with Quad Set  - 1 x daily - 5 x weekly - 2 sets - 10 reps - 1 second hold - Clamshell  - 1 x daily - 5 x weekly - 2 sets - 10 reps

## 2022-07-23 ENCOUNTER — Ambulatory Visit: Payer: Medicare Other | Admitting: Physical Therapy

## 2022-07-23 ENCOUNTER — Ambulatory Visit (INDEPENDENT_AMBULATORY_CARE_PROVIDER_SITE_OTHER): Payer: Medicare Other | Admitting: Family Medicine

## 2022-07-23 ENCOUNTER — Encounter: Payer: Self-pay | Admitting: Family Medicine

## 2022-07-23 DIAGNOSIS — W19XXXD Unspecified fall, subsequent encounter: Secondary | ICD-10-CM

## 2022-07-23 DIAGNOSIS — I10 Essential (primary) hypertension: Secondary | ICD-10-CM | POA: Diagnosis not present

## 2022-07-23 NOTE — Patient Instructions (Addendum)
It was great seeing you today!  I am sorry that you had a fall but glad that you are starting to feel better. Physical therapy is a great way to keep you strong and help you recover sooner so please continue to do this. Try to take tizanidine (the muscle relaxer) every other night.    Please follow up at your next scheduled appointment in 6 months, if anything arises between now and then, please don't hesitate to contact our office.   Thank you for allowing [place] to be a part of your medical care!  Thank you, Dr. Larae Grooms

## 2022-07-23 NOTE — Assessment & Plan Note (Signed)
-  pain improving, pain secondary to fall and underlying osteoarthritis  -recent imaging reviewed and reassuringly no acute changes -continue PT -discussed hesitant to continue tizanadine but recommended to try every other day as pain is improving -consider DEXA scan -follow up in 6 months or sooner as appropriate

## 2022-07-23 NOTE — Progress Notes (Signed)
    SUBJECTIVE:   CHIEF COMPLAINT / HPI:   Patient presents for follow up after a fall that occurred on June 12th at [facility] when she slipped on the floor on a puddle of coffee. She jerked backwards in reaction to falling but did not actually fall on the floor as she was able to brace her fall. She was seen in the clinic prior to this and had imaging done which represented no acute changes. Denies head trauma or loss of consciousness. Still endorsing mild pain but improved. The muscle relaxer helps tremendously at night. Has been going to PT and swimming which have helped. It feels good during PT but then starts to be uncomfortable whenever she is not stretching.   PERTINENT  PMH / PSH:   History of hypertension, complaint on medication. Denies chest pain, dyspnea and leg swelling.   OBJECTIVE:   BP 113/81   Pulse 70   Ht '5\' 8"'$  (1.727 m)   Wt 162 lb 3.2 oz (73.6 kg)   LMP 05/06/2011   SpO2 99%   BMI 24.66 kg/m   General: Patient well-appearing, in no acute distress. HEENT: no cervical spine tenderness CV: RRR, no murmurs or gallops auscultated  Resp: CTAB, no wheezing, rales or rhonchi noted MSK: full active ROM along knees, ankles and shoulder bilaterally, no point tenderness noted  Neuro: CN 2-12 grossly intact, gross sensation intact, 5/5 UE and LE strength bilaterally, normal gait with assistance of cane for ambulation  Psych: mood appropriate   ASSESSMENT/PLAN:   Fall -pain improving, pain secondary to fall and underlying osteoarthritis  -recent imaging reviewed and reassuringly no acute changes -continue PT -discussed hesitant to continue tizanadine but recommended to try every other day as pain is improving -consider DEXA scan -follow up in 6 months or sooner as appropriate   HTN (hypertension) -BP 113/81, well-controlled -continue azor      -PHQ-9 score of 8 with negative question 9 reviewed and discussed.     Donney Dice, [place]

## 2022-07-23 NOTE — Assessment & Plan Note (Signed)
-  BP 113/81, well-controlled -continue azor

## 2022-07-25 ENCOUNTER — Ambulatory Visit: Payer: Medicare Other | Attending: Family Medicine | Admitting: Physical Therapy

## 2022-07-25 ENCOUNTER — Encounter: Payer: Self-pay | Admitting: Physical Therapy

## 2022-07-25 VITALS — BP 98/57 | HR 67

## 2022-07-25 DIAGNOSIS — M25512 Pain in left shoulder: Secondary | ICD-10-CM | POA: Diagnosis present

## 2022-07-25 DIAGNOSIS — R2689 Other abnormalities of gait and mobility: Secondary | ICD-10-CM | POA: Diagnosis present

## 2022-07-25 DIAGNOSIS — R2681 Unsteadiness on feet: Secondary | ICD-10-CM | POA: Diagnosis present

## 2022-07-25 DIAGNOSIS — M25552 Pain in left hip: Secondary | ICD-10-CM | POA: Insufficient documentation

## 2022-07-25 DIAGNOSIS — M6281 Muscle weakness (generalized): Secondary | ICD-10-CM | POA: Insufficient documentation
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
## 2022-07-29 ENCOUNTER — Encounter: Payer: Self-pay | Admitting: Physical Therapy

## 2022-07-29 ENCOUNTER — Ambulatory Visit: Payer: Medicare Other | Admitting: Physical Therapy

## 2022-07-29 DIAGNOSIS — M6281 Muscle weakness (generalized): Secondary | ICD-10-CM | POA: Diagnosis not present

## 2022-07-29 DIAGNOSIS — R2689 Other abnormalities of gait and mobility: Secondary | ICD-10-CM

## 2022-07-29 DIAGNOSIS — R2681 Unsteadiness on feet: Secondary | ICD-10-CM
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
## 2022-07-30 ENCOUNTER — Ambulatory Visit: Payer: Medicare Other | Admitting: Physical Therapy

## 2022-08-01 ENCOUNTER — Ambulatory Visit: Payer: Medicare Other | Admitting: Physical Therapy

## 2022-08-01 ENCOUNTER — Encounter: Payer: Self-pay | Admitting: Physical Therapy

## 2022-08-01 VITALS — BP 109/81 | HR 60

## 2022-08-01 DIAGNOSIS — M6281 Muscle weakness (generalized): Secondary | ICD-10-CM | POA: Diagnosis not present

## 2022-08-01 DIAGNOSIS — R2681 Unsteadiness on feet: Secondary | ICD-10-CM

## 2022-08-01 DIAGNOSIS — R2689 Other abnormalities of gait and mobility: Secondary | ICD-10-CM
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
## 2022-08-05 ENCOUNTER — Encounter: Payer: Self-pay | Admitting: Physical Therapy

## 2022-08-05 ENCOUNTER — Ambulatory Visit: Payer: Medicare Other | Admitting: Physical Therapy

## 2022-08-05 DIAGNOSIS — R2689 Other abnormalities of gait and mobility: Secondary | ICD-10-CM

## 2022-08-05 DIAGNOSIS — M6281 Muscle weakness (generalized): Secondary | ICD-10-CM
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
## 2022-08-06 ENCOUNTER — Ambulatory Visit: Payer: Medicare Other | Admitting: Physical Therapy

## 2022-08-08 ENCOUNTER — Ambulatory Visit: Payer: Medicare Other | Admitting: Physical Therapy

## 2022-08-12 ENCOUNTER — Other Ambulatory Visit: Payer: Self-pay

## 2022-08-12 ENCOUNTER — Ambulatory Visit: Payer: Medicare Other | Admitting: Physical Therapy

## 2022-08-12 DIAGNOSIS — R2689 Other abnormalities of gait and mobility: Secondary | ICD-10-CM

## 2022-08-12 DIAGNOSIS — M6281 Muscle weakness (generalized): Secondary | ICD-10-CM

## 2022-08-12 DIAGNOSIS — R2681 Unsteadiness on feet: Secondary | ICD-10-CM

## 2022-08-12 MED ORDER — AMLODIPINE-OLMESARTAN 5-40 MG PO TABS
1.0000 | ORAL_TABLET | Freq: Every day | ORAL | 3 refills | Status: DC
Start: 2022-08-12 — End: 2023-02-03

## 2022-08-13 ENCOUNTER — Ambulatory Visit: Payer: Medicare Other | Admitting: Physical Therapy

## 2022-08-13 ENCOUNTER — Encounter: Payer: Self-pay | Admitting: Physical Therapy

## 2022-08-13 DIAGNOSIS — R2681 Unsteadiness on feet: Secondary | ICD-10-CM

## 2022-08-13 DIAGNOSIS — R2689 Other abnormalities of gait and mobility: Secondary | ICD-10-CM

## 2022-08-13 DIAGNOSIS — M6281 Muscle weakness (generalized): Secondary | ICD-10-CM | POA: Diagnosis not present
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
## 2022-08-15 ENCOUNTER — Other Ambulatory Visit: Payer: Self-pay | Admitting: Family Medicine

## 2022-08-15 ENCOUNTER — Ambulatory Visit: Payer: Medicare Other | Admitting: Physical Therapy

## 2022-08-15 DIAGNOSIS — W19XXXD Unspecified fall, subsequent encounter: Secondary | ICD-10-CM

## 2022-08-19 ENCOUNTER — Ambulatory Visit: Payer: Medicare Other | Admitting: Physical Therapy

## 2022-08-20 ENCOUNTER — Ambulatory Visit: Payer: Medicare Other | Admitting: Physical Therapy

## 2022-08-22 ENCOUNTER — Ambulatory Visit: Payer: Medicare Other | Admitting: Physical Therapy

## 2022-08-22 ENCOUNTER — Encounter: Payer: Self-pay | Admitting: Physical Therapy

## 2022-08-22 DIAGNOSIS — R2681 Unsteadiness on feet: Secondary | ICD-10-CM

## 2022-08-22 DIAGNOSIS — R2689 Other abnormalities of gait and mobility: Secondary | ICD-10-CM

## 2022-08-22 DIAGNOSIS — M6281 Muscle weakness (generalized): Secondary | ICD-10-CM

## 2022-08-22 NOTE — Patient Instructions (Signed)
Access Code: [email or web address] URL: [email or web address] Date: 08/22/2022 Prepared by: Elease Etienne  Exercises - Supine Bridge  - 1 x daily - 5 x weekly - 2 sets - 12 reps - 1 second hold - Active Straight Leg Raise with Quad Set  - 1 x daily - 5 x weekly - 2 sets - 10 reps - 1 second hold - Clamshell  - 1 x daily - 5 x weekly - 2 sets - 10 reps - Side Stepping with Counter Support  - 1 x daily - 7 x weekly - 3 sets - 10 reps - Backward Walking with Counter Support  - 1 x daily - 7 x weekly - 3 sets - 10 reps
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
## 2022-08-27 ENCOUNTER — Ambulatory Visit: Payer: Medicare Other | Attending: Family Medicine | Admitting: Physical Therapy

## 2022-08-27 ENCOUNTER — Encounter: Payer: Self-pay | Admitting: Physical Therapy

## 2022-08-27 ENCOUNTER — Ambulatory Visit: Payer: Medicare Other | Admitting: Physical Therapy

## 2022-08-27 DIAGNOSIS — M25552 Pain in left hip: Secondary | ICD-10-CM | POA: Insufficient documentation

## 2022-08-27 DIAGNOSIS — W19XXXS Unspecified fall, sequela: Secondary | ICD-10-CM | POA: Insufficient documentation

## 2022-08-27 DIAGNOSIS — R2681 Unsteadiness on feet: Secondary | ICD-10-CM | POA: Insufficient documentation

## 2022-08-27 DIAGNOSIS — R2689 Other abnormalities of gait and mobility: Secondary | ICD-10-CM | POA: Diagnosis present

## 2022-08-27 DIAGNOSIS — R296 Repeated falls: Secondary | ICD-10-CM | POA: Insufficient documentation

## 2022-08-27 DIAGNOSIS — M6281 Muscle weakness (generalized): Secondary | ICD-10-CM | POA: Diagnosis present
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
## 2022-08-29 ENCOUNTER — Ambulatory Visit: Payer: Medicare Other | Admitting: Physical Therapy

## 2022-08-29 ENCOUNTER — Encounter: Payer: Self-pay | Admitting: Physical Therapy

## 2022-08-29 VITALS — BP 111/74 | HR 68

## 2022-08-29 DIAGNOSIS — W19XXXS Unspecified fall, sequela: Secondary | ICD-10-CM

## 2022-08-29 DIAGNOSIS — M6281 Muscle weakness (generalized): Secondary | ICD-10-CM | POA: Diagnosis not present

## 2022-08-29 DIAGNOSIS — R2681 Unsteadiness on feet: Secondary | ICD-10-CM

## 2022-08-29 DIAGNOSIS — R2689 Other abnormalities of gait and mobility: Secondary | ICD-10-CM
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
## 2022-09-03 ENCOUNTER — Ambulatory Visit: Payer: Medicare Other | Admitting: Physical Therapy

## 2022-09-03 ENCOUNTER — Encounter: Payer: Self-pay | Admitting: Physical Therapy

## 2022-09-03 DIAGNOSIS — R2689 Other abnormalities of gait and mobility: Secondary | ICD-10-CM

## 2022-09-03 DIAGNOSIS — R2681 Unsteadiness on feet: Secondary | ICD-10-CM

## 2022-09-03 DIAGNOSIS — R296 Repeated falls: Secondary | ICD-10-CM

## 2022-09-03 DIAGNOSIS — M6281 Muscle weakness (generalized): Secondary | ICD-10-CM | POA: Diagnosis not present

## 2022-09-03 DIAGNOSIS — M25552 Pain in left hip: Secondary | ICD-10-CM
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
## 2022-09-10 ENCOUNTER — Ambulatory Visit: Payer: Medicare Other | Admitting: Physical Therapy

## 2022-09-10 ENCOUNTER — Encounter: Payer: Self-pay | Admitting: Physical Therapy

## 2022-09-10 DIAGNOSIS — R2689 Other abnormalities of gait and mobility: Secondary | ICD-10-CM

## 2022-09-10 DIAGNOSIS — R296 Repeated falls: Secondary | ICD-10-CM

## 2022-09-10 DIAGNOSIS — M6281 Muscle weakness (generalized): Secondary | ICD-10-CM

## 2022-09-10 DIAGNOSIS — M25552 Pain in left hip: Secondary | ICD-10-CM

## 2022-09-10 DIAGNOSIS — R2681 Unsteadiness on feet: Secondary | ICD-10-CM
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
## 2022-09-16 ENCOUNTER — Ambulatory Visit: Payer: Medicare Other | Admitting: Physical Therapy

## 2022-09-18 ENCOUNTER — Other Ambulatory Visit: Payer: Self-pay | Admitting: Family Medicine

## 2022-09-18 DIAGNOSIS — W19XXXD Unspecified fall, subsequent encounter: Secondary | ICD-10-CM

## 2022-09-18 IMAGING — MG MM DIGITAL SCREENING BILAT W/ TOMO AND CAD
8 series · 8 of 24 positions shown · non-contrast
Comparison: Previous exam(s).

CLINICAL DATA: Screening.

EXAM:
DIGITAL SCREENING BILATERAL MAMMOGRAM WITH TOMOSYNTHESIS AND CAD
TECHNIQUE: Bilateral screening digital craniocaudal and mediolateral oblique
mammograms were obtained. Bilateral screening digital breast
tomosynthesis was performed. The images were evaluated with
computer-aided detection.

[L CC synth-2D]
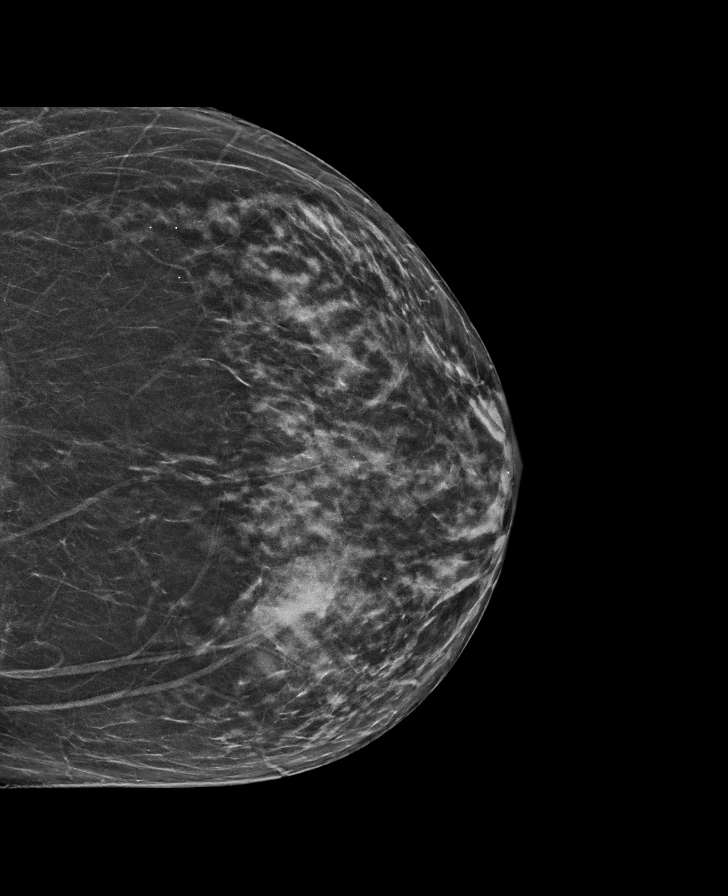

[L MLO synth-2D]
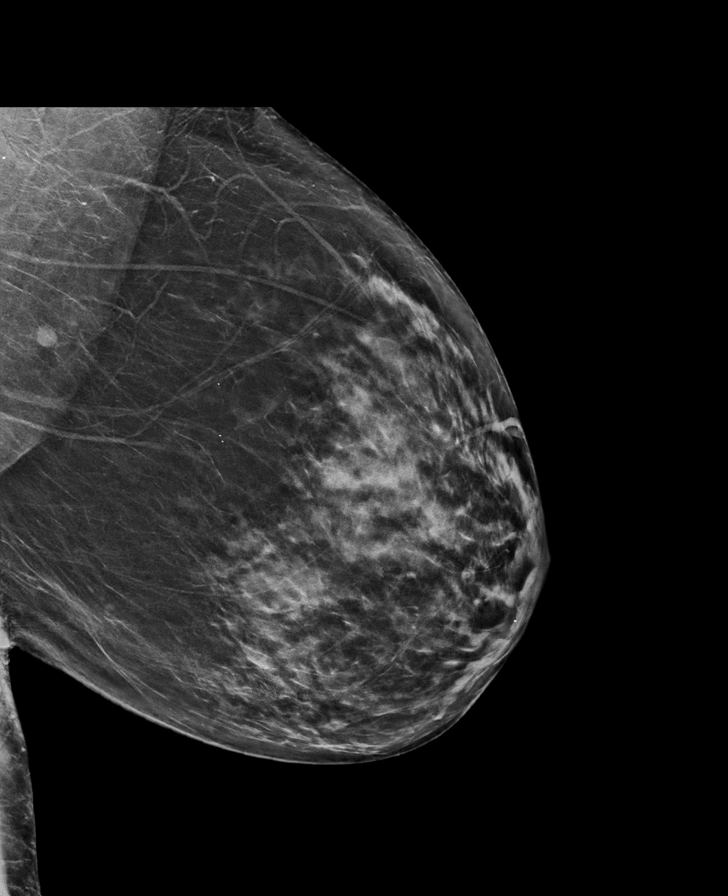

[R MLO synth-2D]
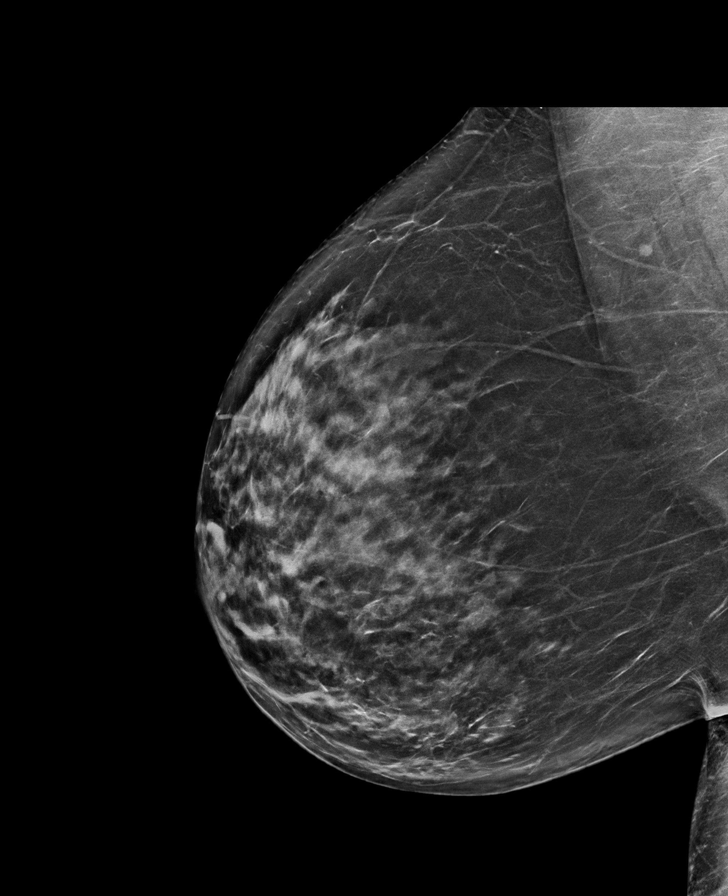

[R CC synth-2D]
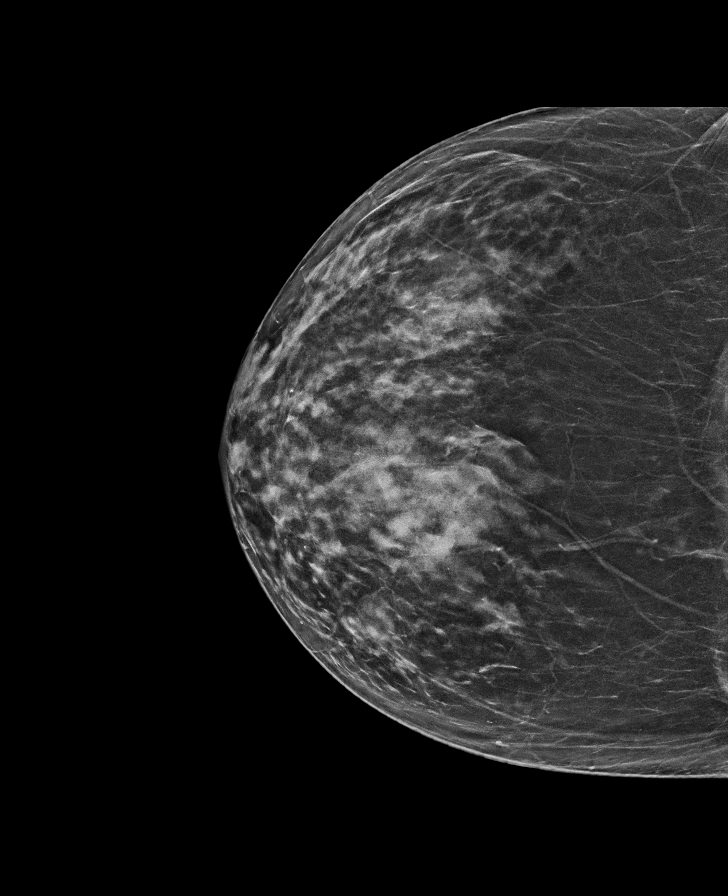

[L MLO tomo · tomo slice 36/71.0]
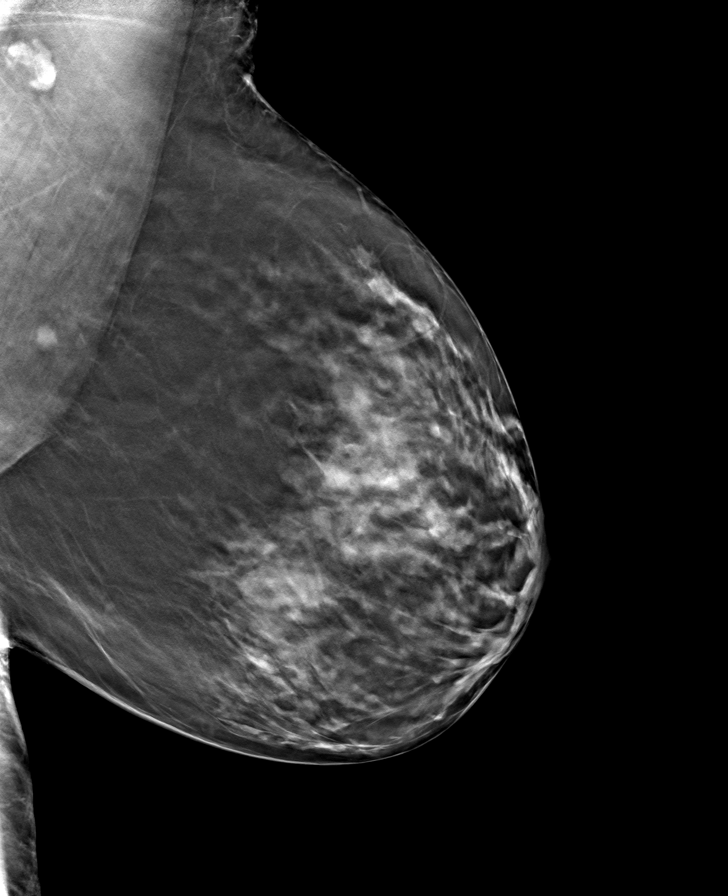

[R CC tomo · tomo slice 33/65.0]
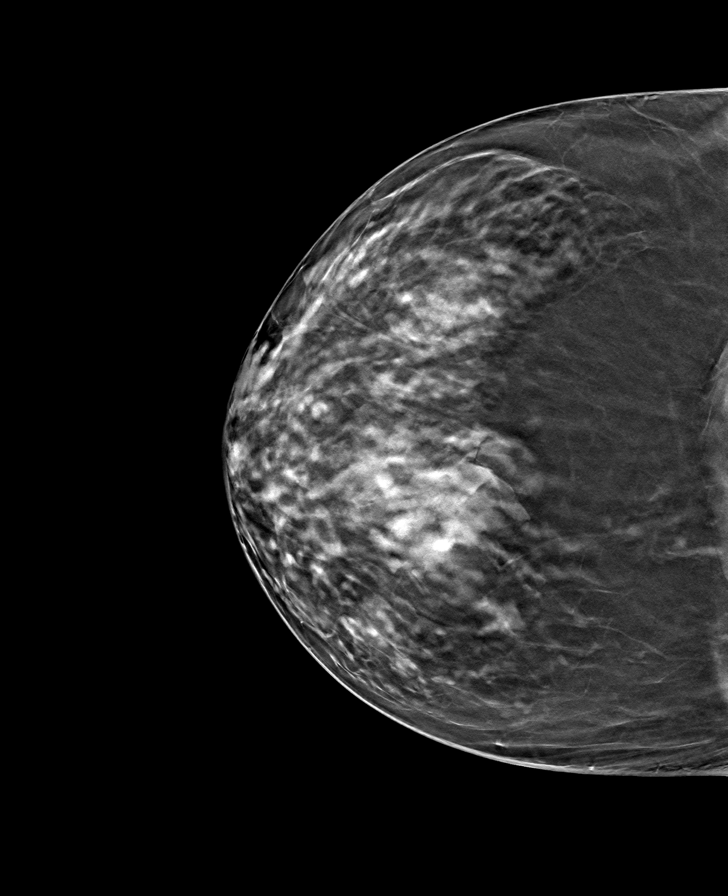

[L CC tomo · tomo slice 29/58.0]
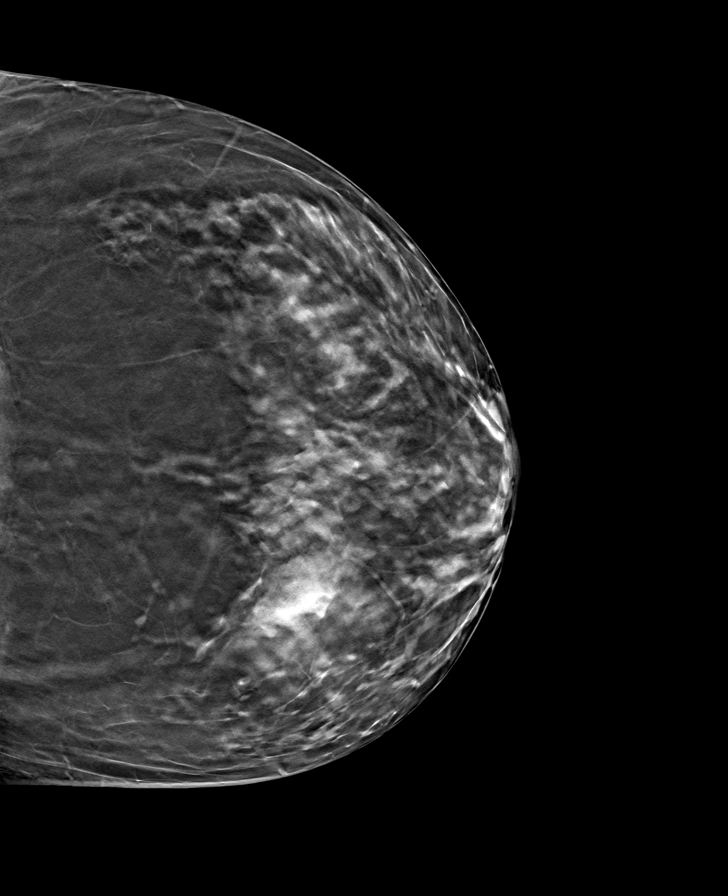

[R MLO tomo · tomo slice 36/71.0]
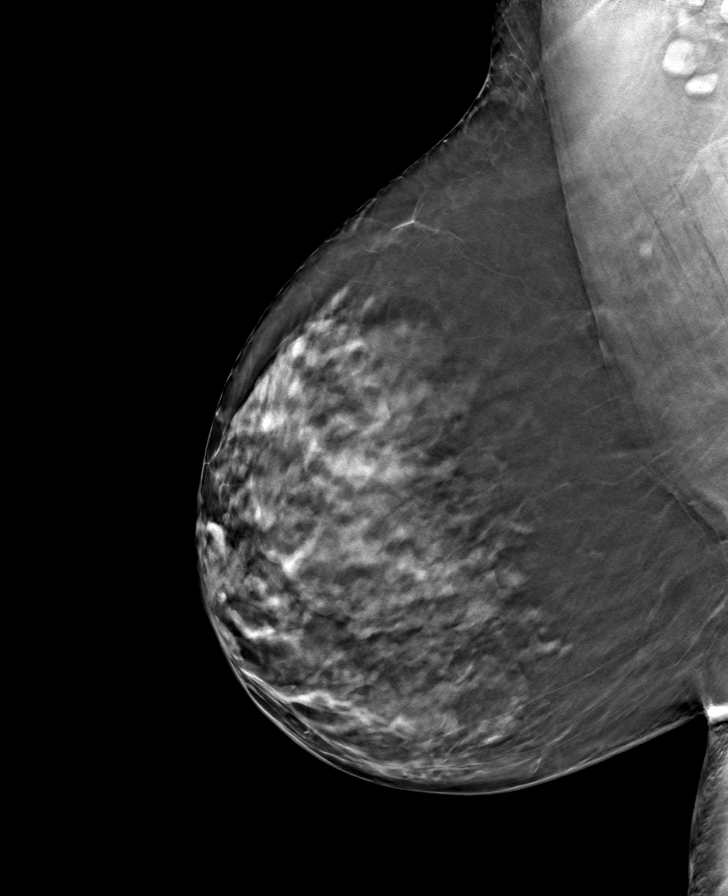

[8 of 24 positions shown; findings below may reference images not displayed]

ACR Breast Density Category c: The breast tissue is heterogeneously
dense, which may obscure small masses.
FINDINGS: There are no findings suspicious for malignancy.
IMPRESSION: No mammographic evidence of malignancy. A result letter of this
screening mammogram will be mailed directly to the patient.

RECOMMENDATION:
Screening mammogram in one year. (Code:[id number])

BI-RADS CATEGORY  1: Negative.

## 2022-09-23 ENCOUNTER — Ambulatory Visit: Payer: Medicare Other | Admitting: Physical Therapy

## 2022-09-24 ENCOUNTER — Telehealth: Payer: Self-pay | Admitting: Family Medicine

## 2022-09-24 NOTE — Telephone Encounter (Signed)
Disability Parking Placard form was dropped off by patient.  Put in blue folder to be completed by PCP

## 2022-09-24 NOTE — Telephone Encounter (Signed)
Clinical info completed on Parking Placard form.  Placed form in PCP's box for completion.    When form is completed, please route note to "RN Team" and place in wall pocket in front office.   Salvatore Marvel, CMA

## 2022-09-25 ENCOUNTER — Telehealth: Payer: Self-pay | Admitting: Physical Therapy

## 2022-09-25 NOTE — Telephone Encounter (Signed)
PT spoke to pt about scheduling changes to aquatics and rescheduling onto new therapist.  Pt in agreement to changing 10/9 appt to 3:30pm w/ new provider and rescheduling remaining appts at that time.  Will make changes to schedule.  Elease Etienne, PT, DPT

## 2022-09-30 ENCOUNTER — Ambulatory Visit: Payer: Medicare Other | Admitting: Physical Therapy

## 2022-10-01 ENCOUNTER — Telehealth: Payer: Self-pay | Admitting: Family Medicine

## 2022-10-01 NOTE — Telephone Encounter (Signed)
Patient came by and picked up her paperwork states there was another form that she gave me I atttached all paperwork to original that I sent back.  She needs the other paperwork from [facility] that was along with the others that I sent back.  Should have been 3 papers and I know I had them all clipped together.  Cherrie Distance was the nurse who had PCP to fill out and then it was completed and put at the front desk.  She's upset that she needed that paper.  I told her I did have and clipped all together.  Just let me know if any one saw the extra paper.  If we can't find it she will  have to get another one.

## 2022-10-01 NOTE — Telephone Encounter (Signed)
Form placed up front for pick up.   Copy made for batch scanning.   Patient aware.  

## 2022-10-02 NOTE — Telephone Encounter (Signed)
There was only 1 paper which was the placcard. Salvatore Marvel, CMA

## 2022-10-02 NOTE — Telephone Encounter (Signed)
All papers that Grayce had clipped  for blue team to pick up, was given to PCP. Salvatore Marvel, CMA

## 2022-10-07 ENCOUNTER — Ambulatory Visit: Payer: Medicare Other | Attending: Family Medicine | Admitting: Physical Therapy

## 2022-10-07 ENCOUNTER — Ambulatory Visit: Payer: Medicare Other | Admitting: Physical Therapy

## 2022-10-07 DIAGNOSIS — M6281 Muscle weakness (generalized): Secondary | ICD-10-CM | POA: Insufficient documentation

## 2022-10-07 DIAGNOSIS — M25552 Pain in left hip: Secondary | ICD-10-CM | POA: Diagnosis not present

## 2022-10-07 DIAGNOSIS — R2689 Other abnormalities of gait and mobility: Secondary | ICD-10-CM | POA: Insufficient documentation

## 2022-10-08 ENCOUNTER — Encounter: Payer: Self-pay | Admitting: Physical Therapy
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
## 2022-10-14 ENCOUNTER — Ambulatory Visit: Payer: Medicare Other | Admitting: Physical Therapy

## 2022-10-14 DIAGNOSIS — M25552 Pain in left hip: Secondary | ICD-10-CM

## 2022-10-14 DIAGNOSIS — M6281 Muscle weakness (generalized): Secondary | ICD-10-CM | POA: Diagnosis not present

## 2022-10-14 DIAGNOSIS — R2689 Other abnormalities of gait and mobility: Secondary | ICD-10-CM

## 2022-10-15 ENCOUNTER — Encounter: Payer: Self-pay | Admitting: Physical Therapy
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
## 2022-10-21 ENCOUNTER — Ambulatory Visit: Payer: Medicare Other | Admitting: Physical Therapy

## 2022-10-21 DIAGNOSIS — R2689 Other abnormalities of gait and mobility: Secondary | ICD-10-CM

## 2022-10-21 DIAGNOSIS — M6281 Muscle weakness (generalized): Secondary | ICD-10-CM

## 2022-10-21 DIAGNOSIS — M25552 Pain in left hip: Secondary | ICD-10-CM | POA: Diagnosis not present

## 2022-10-22 ENCOUNTER — Other Ambulatory Visit: Payer: Self-pay | Admitting: Family Medicine

## 2022-10-22 ENCOUNTER — Encounter: Payer: Self-pay | Admitting: Physical Therapy

## 2022-10-22 DIAGNOSIS — W19XXXD Unspecified fall, subsequent encounter: Secondary | ICD-10-CM
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
## 2022-11-09 ENCOUNTER — Encounter: Payer: Self-pay | Admitting: *Deleted

## 2022-11-26 ENCOUNTER — Encounter: Payer: Self-pay | Admitting: *Deleted

## 2022-11-26 NOTE — Progress Notes (Signed)
Per chart review, pt seen by [place] [place] PCP, Donney Dice in 8/23, has had ongoing office contacts and PT for 8/23 fall at [facility], and has a future appt with Dr. Larae Grooms on 12/06/22. No b/p or other data charted from 11/09/22 event, so no info to confirm via letter. Since pt has ongoing PCP contact and upcoming appt, no further health equity team support indicated at this time.

## 2022-12-06 ENCOUNTER — Ambulatory Visit: Payer: Medicare Other | Admitting: Family Medicine

## 2022-12-13 ENCOUNTER — Ambulatory Visit (INDEPENDENT_AMBULATORY_CARE_PROVIDER_SITE_OTHER): Payer: Medicare Other | Admitting: Family Medicine

## 2022-12-13 ENCOUNTER — Ambulatory Visit (HOSPITAL_COMMUNITY)
Admission: RE | Admit: 2022-12-13 | Discharge: 2022-12-13 | Disposition: A | Discharge status: Home / Self Care | Payer: Medicare Other | Source: Ambulatory Visit | Attending: Family Medicine | Admitting: Family Medicine

## 2022-12-13 ENCOUNTER — Encounter: Payer: Self-pay | Admitting: Family Medicine

## 2022-12-13 VITALS — BP 127/86 | HR 86 | Ht 68.0 in | Wt 167.8 lb

## 2022-12-13 DIAGNOSIS — F419 Anxiety disorder, unspecified: Secondary | ICD-10-CM | POA: Diagnosis not present

## 2022-12-13 DIAGNOSIS — R002 Palpitations: Secondary | ICD-10-CM | POA: Insufficient documentation

## 2022-12-13 MED ORDER — DULOXETINE HCL 60 MG PO CPEP: 60.0000 mg | ORAL_CAPSULE | Freq: Every day | ORAL | 3 refills | Status: DC

## 2022-12-13 NOTE — Progress Notes (Unsigned)
    SUBJECTIVE:   CHIEF COMPLAINT / HPI:   Patient presents with chest tightness and fluttering that started last week when she was ill. She was recently sick last week when she ended up vomiting. Today she feels much better. First noticed her heart racing when she was vomiting. She feels like the heart racing has improved but seems to be continuous since last week. Reports compliance on all her medications. This has affected her mood because she got very nervous when she was sick which caused her mood to worsen. Her sister's death anniversary is approaching next month and hat also causes her anxiety. Other personal stressors include her grandchildren, she feels like she needs a vacation but this is difficult with financial restraints. She feels that everyone calls her only when they want something. Support system includes her son.   OBJECTIVE:   BP 127/86   Pulse 86   Ht '5\' 8"'$  (1.727 m)   Wt 167 lb 12.8 oz (76.1 kg)   LMP 05/06/2011   SpO2 100%   BMI 25.51 kg/m   General: Patient tired appearing and in no acute distress. HEENT: PERRLA, non-tender thyroid CV: RRR, no murmurs or gallops auscultated Resp: CTAB, no wheezing, rales or rhonchi noted Abdomen: soft, nontender, nondistended, presence of bowel sounds Ext: radial pulses strong and equal bilaterally, no LE edema noted bilaterally  Psych: tearful, anxious, decreased mood, denies SI  ASSESSMENT/PLAN:   Palpitations -considered many differentials including arrhythmia, thyroid, anxiety state with multiple recent ongoing stressors and anemia. Also possibly secondary to recent illness which patient is better now. I believe this is most secondary to illness and anxiety  -EKG wnl with the exception of presence of PVCs but no concern for tachycardia or other arrhythmia.  -would like to hold off on metoprolol given that HR and BP appropriate -zio patch ordered to try to capture these episodes to further evaluate for cardiac cause -TSH,  CMP and CBC pending -close follow up scheduled with strict ED precautions discussed   Anxiety -PHQ-9 score of 8 with negative question 9 reviewed and discussed extensively.  -anxiety state definitely contributing patient's symptoms, difficult as she has ongoing stressors along with recently recovering from GI illness -increased cymbalta to 60 mg daily -therapy resources provided -reassurance provided -close follow up scheduled for mood check    Enis Leatherwood Larae Grooms, [place]

## 2022-12-13 NOTE — Patient Instructions (Addendum)
It was great seeing you today!  Today we discussed your heart palpitations and chest tightness, I am glad that this has improved slightly but since you are still experiencing heart racing we did an EKG today which looked fine. I have also ordered a zio patch which will capture your heart rhythm to make sure there are no abnormalities. We also got blood work today, I will let you know of any abnormal results.  I think anxiety is playing a significant role in your symptoms. I have increased your cymbala to 60 mg daily. Below are a list of therapists, please pick one that works for you and establish care.   If you have any chest pain, shortness of breath or worsening palpitations then please go to the emergency department.  Please follow up at your next scheduled appointment in 1-2 weeks, if anything arises between now and then, please don't hesitate to contact our office.   Thank you for allowing [place] to be a part of your medical care!  Thank you, Dr. Larae Grooms  Also a reminder of our clinic's no-show policy. Please make sure to arrive at least 15 minutes prior to your scheduled appointment time. Please try to cancel before 24 hours if you are not able to make it. If you no-show for 2 appointments then you will be receiving a warning letter. If you no-show after 3 visits, then you may be at risk of being dismissed from our clinic. This is to ensure that everyone is able to be seen in a timely manner. Thank you, we appreciate your assistance with this!    Therapy and Counseling Resources Most providers on this list will take Medicaid. Patients with commercial insurance or Medicare should contact their insurance company to get a list of in network providers.  [employer] (takes children) Location 1: [address], [place], [place] [address] Location 2: [place], [place] [address] [place] ([place] speaking therapist available)(habla espanol)(take  medicare and medicaid)  [place], [place], [place] [address], [place] [email or web address] [phone]  [facility]iatry and Counseling [address] [place], [place] [address] [place], [place], [place] [address]      [phone]  [place] (spanish available) [place], [place] [address] [place] (take [facility] and medicare) [address], [place], [place] [address]       [phone]     [place] (virtual only) [phone]  Jinny Blossom [facility] [address], [place], [place]  Family Solutions:  [place]. [place] [phone]  [facility]:  [place] [address] [place] [phone]  [facility] (under & uninsured) [address], [place] [phone]    [email or web address]    [place] [address]  [place]    [phone]  [facility] [place]     Phone:  [phone]     [place]  [place] [address]      [place], [place] ext [place]: [place], [place], [place]   [place] ([place] therapist) [email or web address]  [place] [address]   [place]  [place],  [place], [place]   [place]  [place]  [facility]  [address] [place], [place]        [phone]  Open Access/Walk In Clinic under & uninsured  [facility]  [address] [place], [place] Crisis [phone]  Family  Service of the [place],  ([place])   [place], [place]: [phone]) 8:30 - 12; 1 - 2:30  Family Service of the [place],  [place], [place]    ([phone]):8:30 - 12; 2 - 3PM  [facility] [employer],  [address],  [place]; [phone]):   Mon - Fri 8 AM - 5 PM  [facility] [place]  MWF 12:30 to 3:00 or call to schedule an appointment  [phone]  Specific Provider options Psychology Today  [email or web address] click on find a therapist  enter your zip code left side and select or tailor a therapist for your specific need.   [facility] Provider Directory [email or web address]  (Medicaid)   Follow all drop down to find a provider  [place] or [email or web address] [address], [place], [place] Recovery support and educational   24- Hour Availability:   [facility]  [address] [place], [place] Crisis [phone]  Family Service of the [employer] [phone]  [place]  [phone]   [place]  [phone] (after hours)  Therapeutic Alternative/Mobile Crisis   [phone]  [place] National Suicide Hotline  [phone] [place]ina [place])  Call 911 or go to emergency room  [facility]  [phone]);  [place] and [employer]  [phone]); [place], [place], [place], [place], [place], [place], [place]

## 2022-12-14 DIAGNOSIS — R002 Palpitations: Secondary | ICD-10-CM | POA: Insufficient documentation

## 2022-12-14 DIAGNOSIS — F419 Anxiety disorder, unspecified: Secondary | ICD-10-CM | POA: Insufficient documentation

## 2022-12-14 LAB — COMPREHENSIVE METABOLIC PANEL
ALT: 21 IU/L (ref 0–32)
AST: 18 IU/L (ref 0–40)
Albumin/Globulin Ratio: 1.7 (ref 1.2–2.2)
Albumin: 4.5 g/dL (ref 3.8–4.9)
Alkaline Phosphatase: 67 IU/L (ref 44–121)
BUN/Creatinine Ratio: 21 (ref 12–28)
BUN: 21 mg/dL (ref 8–27)
Bilirubin Total: 0.3 mg/dL (ref 0.0–1.2)
CO2: 25 mmol/L (ref 20–29)
Calcium: 10.8 mg/dL — ABNORMAL HIGH (ref 8.7–10.3)
Chloride: 101 mmol/L (ref 96–106)
Creatinine, Ser: 1 mg/dL (ref 0.57–1.00)
Globulin, Total: 2.6 g/dL (ref 1.5–4.5)
Glucose: 78 mg/dL (ref 70–99)
Potassium: 4.4 mmol/L (ref 3.5–5.2)
Sodium: 139 mmol/L (ref 134–144)
Total Protein: 7.1 g/dL (ref 6.0–8.5)
eGFR: 64 mL/min/{1.73_m2} (ref >59)

## 2022-12-14 LAB — CBC
Hematocrit: 38 % (ref 34.0–46.6)
Hemoglobin: 12.3 g/dL (ref 11.1–15.9)
MCH: 30 pg (ref 26.6–33.0)
MCHC: 32.4 g/dL (ref 31.5–35.7)
MCV: 93 fL (ref 79–97)
Platelets: 393 10*3/uL (ref 150–450)
RBC: 4.1 x10E6/uL (ref 3.77–5.28)
RDW: 13.2 % (ref 11.7–15.4)
WBC: 6.1 10*3/uL (ref 3.4–10.8)

## 2022-12-14 LAB — TSH: TSH: 0.701 u[IU]/mL (ref 0.450–4.500)

## 2022-12-14 NOTE — Assessment & Plan Note (Signed)
-  considered many differentials including arrhythmia, thyroid, anxiety state with multiple recent ongoing stressors and anemia. Also possibly secondary to recent illness which patient is better now. I believe this is most secondary to illness and anxiety  -EKG wnl with the exception of presence of PVCs but no concern for tachycardia or other arrhythmia.  -would like to hold off on metoprolol given that HR and BP appropriate -zio patch ordered to try to capture these episodes to further evaluate for cardiac cause -TSH, CMP and CBC pending -close follow up scheduled with strict ED precautions discussed

## 2022-12-14 NOTE — Assessment & Plan Note (Signed)
-  PHQ-9 score of 8 with negative question 9 reviewed and discussed extensively.  -anxiety state definitely contributing patient's symptoms, difficult as she has ongoing stressors along with recently recovering from GI illness -increased cymbalta to 60 mg daily -therapy resources provided -reassurance provided -close follow up scheduled for mood check

## 2022-12-17 ENCOUNTER — Ambulatory Visit: Payer: Medicare Other | Attending: Family Medicine

## 2022-12-17 ENCOUNTER — Encounter: Payer: Self-pay | Admitting: Radiology

## 2022-12-17 DIAGNOSIS — R002 Palpitations: Secondary | ICD-10-CM

## 2022-12-17 NOTE — Progress Notes (Unsigned)
Enrolled patient for a 4 day Zio XT monitor to be mailed to patients home   DOD to read

## 2022-12-18 ENCOUNTER — Encounter: Payer: Self-pay | Admitting: Family Medicine

## 2022-12-18 ENCOUNTER — Other Ambulatory Visit: Payer: Self-pay

## 2022-12-18 DIAGNOSIS — G8929 Other chronic pain: Secondary | ICD-10-CM

## 2022-12-18 MED ORDER — GABAPENTIN 300 MG PO CAPS: 300.0000 mg | ORAL_CAPSULE | Freq: Two times a day (BID) | ORAL | 3 refills | Status: DC

## 2022-12-27 ENCOUNTER — Encounter: Payer: Self-pay | Admitting: Family Medicine

## 2022-12-27 ENCOUNTER — Ambulatory Visit (INDEPENDENT_AMBULATORY_CARE_PROVIDER_SITE_OTHER): Payer: Medicare Other | Admitting: Family Medicine

## 2022-12-27 VITALS — BP 119/73 | HR 67 | Ht 68.0 in | Wt 172.2 lb

## 2022-12-27 DIAGNOSIS — R002 Palpitations: Secondary | ICD-10-CM

## 2022-12-27 DIAGNOSIS — F419 Anxiety disorder, unspecified: Secondary | ICD-10-CM

## 2022-12-27 NOTE — Assessment & Plan Note (Signed)
-  PHQ-9 score of 8 with negative question 9 reviewed and discussed.  -improved, continue celexa -follow up in 1 month for mood check

## 2022-12-27 NOTE — Patient Instructions (Signed)
It was great seeing you today!  Today we discussed your symptoms, I am so glad that this has resolved and you are feeling  much better. We have applied the zio patch, keep this on for 14 days and then mail it to the address instructed on the box. This will give [place] an idea of your heart rhythm over this period of time.   Please follow up at your next scheduled appointment in 1 month, if anything arises between now and then, please don't hesitate to contact our office.   Thank you for allowing [place] to be a part of your medical care!  Thank you, Dr. Larae Grooms  Also a reminder of our clinic's no-show policy. Please make sure to arrive at least 15 minutes prior to your scheduled appointment time. Please try to cancel before 24 hours if you are not able to make it. If you no-show for 2 appointments then you will be receiving a warning letter. If you no-show after 3 visits, then you may be at risk of being dismissed from our clinic. This is to ensure that everyone is able to be seen in a timely manner. Thank you, we appreciate your assistance with this!

## 2022-12-27 NOTE — Assessment & Plan Note (Signed)
-  resolved, likely secondary to anxiety -assisted patient with assembling zio patch and instructed to send it back to this address once she wears it for the duration she is instructed to do so

## 2022-12-27 NOTE — Progress Notes (Signed)
    SUBJECTIVE:   CHIEF COMPLAINT / HPI:   Patient presents for a follow up after experiencing palpitations. She was also experiencing a lot of personal stressors at that time. Reports that she feels a lot better with her personal life and her mood. Also reports that she has not experienced any palpitations since her last visit with me. Denies chest pain, dyspnea and other symptoms. States that she is feeling significantly better. She received the zio patch and has brought this in today with her.   OBJECTIVE:   BP 119/73   Pulse 67   Ht '5\' 8"'$  (1.727 m)   Wt 172 lb 4 oz (78.1 kg)   LMP 05/06/2011   SpO2 100%   BMI 26.19 kg/m   General: Patient well-appearing, in no acute distress. CV: RRR, no murmurs or gallops auscultated  Resp: CTAB, no wheezing, rales or rhonchi noted Psych: mood appropriate, pleasant   ASSESSMENT/PLAN:   Palpitations -resolved, likely secondary to anxiety -assisted patient with assembling zio patch and instructed to send it back to this address once she wears it for the duration she is instructed to do so  Anxiety -PHQ-9 score of 8 with negative question 9 reviewed and discussed.  -improved, continue celexa -follow up in 1 month for mood check     [place] Larae Grooms, [place]

## 2023-01-15 ENCOUNTER — Other Ambulatory Visit: Payer: Self-pay | Admitting: Family Medicine

## 2023-01-15 DIAGNOSIS — Z1231 Encounter for screening mammogram for malignant neoplasm of breast: Secondary | ICD-10-CM

## 2023-01-27 ENCOUNTER — Ambulatory Visit: Payer: Self-pay | Admitting: Family Medicine

## 2023-01-27 DIAGNOSIS — D122 Benign neoplasm of ascending colon: Secondary | ICD-10-CM | POA: Diagnosis not present

## 2023-01-27 DIAGNOSIS — K635 Polyp of colon: Secondary | ICD-10-CM | POA: Diagnosis not present

## 2023-01-27 DIAGNOSIS — Z1211 Encounter for screening for malignant neoplasm of colon: Secondary | ICD-10-CM | POA: Diagnosis not present

## 2023-01-27 DIAGNOSIS — Z86010 Personal history of colonic polyps: Secondary | ICD-10-CM | POA: Diagnosis not present

## 2023-02-03 ENCOUNTER — Encounter: Payer: Self-pay | Admitting: Family Medicine

## 2023-02-03 ENCOUNTER — Ambulatory Visit (INDEPENDENT_AMBULATORY_CARE_PROVIDER_SITE_OTHER): Payer: 59 | Admitting: Family Medicine

## 2023-02-03 VITALS — BP 111/72 | HR 68 | Ht 68.0 in | Wt 170.4 lb

## 2023-02-03 DIAGNOSIS — F419 Anxiety disorder, unspecified: Secondary | ICD-10-CM | POA: Diagnosis not present

## 2023-02-03 DIAGNOSIS — G8929 Other chronic pain: Secondary | ICD-10-CM

## 2023-02-03 DIAGNOSIS — M79643 Pain in unspecified hand: Secondary | ICD-10-CM | POA: Diagnosis not present

## 2023-02-03 DIAGNOSIS — I10 Essential (primary) hypertension: Secondary | ICD-10-CM

## 2023-02-03 DIAGNOSIS — R4586 Emotional lability: Secondary | ICD-10-CM | POA: Insufficient documentation

## 2023-02-03 MED ORDER — AMLODIPINE-OLMESARTAN 5-40 MG PO TABS: 1.0000 | ORAL_TABLET | Freq: Every day | ORAL | 3 refills | Status: DC

## 2023-02-03 MED ORDER — DULOXETINE HCL 60 MG PO CPEP: 60.0000 mg | ORAL_CAPSULE | Freq: Every day | ORAL | 3 refills | Status: DC

## 2023-02-03 MED ORDER — GABAPENTIN 300 MG PO CAPS: 300.0000 mg | ORAL_CAPSULE | Freq: Two times a day (BID) | ORAL | 3 refills | Status: DC

## 2023-02-03 NOTE — Assessment & Plan Note (Signed)
-  anxiety and depression seems to continue to improve -PHQ-9 score of 8 with negative question 9 reviewed which is stable from last visit -continue cymbalta  -reassurance provided, congratulated patient on how far she has progressed  -cymbalta refill provided  -follow up in 2 months

## 2023-02-03 NOTE — Assessment & Plan Note (Addendum)
-  BP 111/72, at goal -continue amlodipine-olmesartan, refills provided  -appropriate renal function noted on prior blood work -diet and exercise counseling provided

## 2023-02-03 NOTE — Progress Notes (Signed)
    SUBJECTIVE:   CHIEF COMPLAINT / HPI:   Hypertension Denies chest pain, shortness of breath, palpitations or other symptoms. Compliant on amlodipine-olmesartan daily and continues to tolerate medication well.   Mood changes Reports doing well since she changed her mindset on putting herself first. Son still remains to be her support system but she makes sure to take care of herself. They have disagreements at times but she makes sure to stay in a good, healthy head space. Compliant on daily cymbalta, continues to tolerate increased dose well. She just lost her niece's mother-in-law and now cousin is in the hospital but says that in spite all of this she is still taking care of herself and is well.  Just lost niece's mother in-law and now cousin in the hospital, but doing well. Denies thoughts of harming herself.   OBJECTIVE:   BP 111/72   Pulse 68   Ht '5\' 8"'$  (1.727 m)   Wt 170 lb 6 oz (77.3 kg)   LMP 05/06/2011   SpO2 100%   BMI 25.91 kg/m   General: Patient well-appearing, in no acute distress. CV: RRR, no murmurs or gallops auscultated Resp: CTAB, no wheezing, rales or rhonchi noted Psych: mood appropriate, smiling and in very good spirits, denies SI or plan    ASSESSMENT/PLAN:   HTN (hypertension) -BP 111/72, at goal -continue amlodipine-olmesartan, refills provided  -appropriate renal function noted on prior blood work -diet and exercise counseling provided   Mood changes -anxiety and depression seems to continue to improve -PHQ-9 score of 8 with negative question 9 reviewed which is stable from last visit -continue cymbalta  -reassurance provided, congratulated patient on how far she has progressed  -cymbalta refill provided  -follow up in 2 months      [place], [place]

## 2023-02-03 NOTE — Patient Instructions (Addendum)
It was great seeing you today!  Today we discussed your mood, I am glad that you continue to be well. Please keep taking your cymbalta.   Your blood pressure looks great, please continue to take your amlodipine-olmesartan.   I have sent refills of both your cymbalta and amlodipine-olmesartan to your pharmacy.   Please follow up at your next scheduled appointment in 3 months,  if anything arises between now and then, please don't hesitate to contact our office.   Thank you for allowing [place] to be a part of your medical care!  Thank you, Dr. Larae Grooms  Also a reminder of our clinic's no-show policy. Please make sure to arrive at least 15 minutes prior to your scheduled appointment time. Please try to cancel before 24 hours if you are not able to make it. If you no-show for 2 appointments then you will be receiving a warning letter. If you no-show after 3 visits, then you may be at risk of being dismissed from our clinic. This is to ensure that everyone is able to be seen in a timely manner. Thank you, we appreciate your assistance with this!

## 2023-02-18 ENCOUNTER — Telehealth: Payer: Self-pay

## 2023-02-18 ENCOUNTER — Ambulatory Visit: Payer: 59

## 2023-02-18 ENCOUNTER — Telehealth: Payer: Self-pay | Admitting: Family Medicine

## 2023-02-18 NOTE — Telephone Encounter (Signed)
Unsuccessful attempt to reach patient on preferred number listed in notes for scheduled AWV. Unable to leave message on voicemail.

## 2023-02-18 NOTE — Telephone Encounter (Signed)
Contacted Herminia Lily Kocher to schedule their annual wellness visit. Appointment made for 02/18/2023.  Thank you,  [place] Direct dial  [phone]

## 2023-03-05 ENCOUNTER — Ambulatory Visit: Payer: 59

## 2023-03-07 ENCOUNTER — Ambulatory Visit (INDEPENDENT_AMBULATORY_CARE_PROVIDER_SITE_OTHER): Payer: 59

## 2023-03-07 DIAGNOSIS — Z0000 Encounter for general adult medical examination without abnormal findings: Secondary | ICD-10-CM | POA: Diagnosis not present
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
## 2023-03-07 NOTE — Patient Instructions (Signed)

## 2023-03-11 ENCOUNTER — Ambulatory Visit
Admission: RE | Admit: 2023-03-11 | Discharge: 2023-03-11 | Disposition: A | Discharge status: Home / Self Care | Payer: 59 | Source: Ambulatory Visit | Attending: Obstetrics and Gynecology | Admitting: Obstetrics and Gynecology

## 2023-03-11 DIAGNOSIS — Z1231 Encounter for screening mammogram for malignant neoplasm of breast: Secondary | ICD-10-CM

## 2023-03-13 ENCOUNTER — Encounter: Payer: Self-pay | Admitting: Family Medicine

## 2023-04-17 ENCOUNTER — Ambulatory Visit (INDEPENDENT_AMBULATORY_CARE_PROVIDER_SITE_OTHER): Payer: 59 | Admitting: Family Medicine

## 2023-04-17 ENCOUNTER — Encounter: Payer: Self-pay | Admitting: Family Medicine

## 2023-04-17 VITALS — BP 120/78 | HR 75 | Ht 68.0 in | Wt 165.8 lb

## 2023-04-17 DIAGNOSIS — R4586 Emotional lability: Secondary | ICD-10-CM

## 2023-04-17 NOTE — Assessment & Plan Note (Signed)
-  overall seems to still be improving despite minor setbacks that she has with negative encounters with others -PHQ-9 score of 8 with negative question 9 reviewed and discussed.  -coping techniques discussed and reassurance provided -continue cymbalta daily  -follow up in 1 month for mood check or sooner as appropriate
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
## 2023-04-17 NOTE — Assessment & Plan Note (Signed)
-  BP 120/78, at goal -continue olmesartan-amlodipine daily -diet and exercise counseling -renal function and electrolytes wnl and up to date -follow up in 1 month

## 2023-04-17 NOTE — Patient Instructions (Addendum)
It was great seeing you today!  Today we discussed your mood, I am glad that you are doing well in spite of having some situations that are not as favorable. Just remember some of the coping strategies we discussed. Please continue to your cymbalta.  Your blood pressure looks great, please keep taking your medication daily.   Please follow up at your next scheduled appointment in 1 month, if anything arises between now and then, please don't hesitate to contact our office.   Thank you for allowing [place] to be a part of your medical care!  Thank you, Dr. Robyne Peers

## 2023-05-14 LAB — AMB RESULTS CONSOLE CBG: Glucose: 110

## 2023-06-13 ENCOUNTER — Ambulatory Visit (INDEPENDENT_AMBULATORY_CARE_PROVIDER_SITE_OTHER): Payer: 59 | Admitting: Family Medicine

## 2023-06-13 ENCOUNTER — Encounter: Payer: Self-pay | Admitting: Family Medicine

## 2023-06-13 VITALS — BP 125/81 | HR 64 | Ht 68.0 in | Wt 166.6 lb

## 2023-06-13 DIAGNOSIS — R7303 Prediabetes: Secondary | ICD-10-CM

## 2023-06-13 DIAGNOSIS — F419 Anxiety disorder, unspecified: Secondary | ICD-10-CM | POA: Diagnosis not present

## 2023-06-13 DIAGNOSIS — M79643 Pain in unspecified hand: Secondary | ICD-10-CM

## 2023-06-13 DIAGNOSIS — I10 Essential (primary) hypertension: Secondary | ICD-10-CM | POA: Diagnosis not present

## 2023-06-13 DIAGNOSIS — G8929 Other chronic pain: Secondary | ICD-10-CM

## 2023-06-13 LAB — POCT GLYCOSYLATED HEMOGLOBIN (HGB A1C): HbA1c, POC (prediabetic range): 6.1 % (ref 5.7–6.4)

## 2023-06-13 MED ORDER — AMLODIPINE-OLMESARTAN 5-40 MG PO TABS: 1.0000 | ORAL_TABLET | Freq: Every day | ORAL | 3 refills | Status: DC

## 2023-06-13 MED ORDER — DULOXETINE HCL 60 MG PO CPEP: 60.0000 mg | ORAL_CAPSULE | Freq: Every day | ORAL | 3 refills | Status: DC

## 2023-06-13 MED ORDER — GABAPENTIN 300 MG PO CAPS: 300.0000 mg | ORAL_CAPSULE | Freq: Two times a day (BID) | ORAL | 3 refills | Status: DC

## 2023-06-13 NOTE — Assessment & Plan Note (Signed)
-  chronic and improved -PHQ-9 score of 8 with negative question 9 reviewed.  -reassurance provided and continue to encourage utilizing coping strategies -continue cymbalta, refills provided -follow up in 3-6 months

## 2023-06-13 NOTE — Assessment & Plan Note (Signed)
-  A1c 6.1 today, remains in prediabetes range -extensive diet and exercise counseling provided, handout provided as well to prevent progression to DM -advised to maintain annual routine follow up with ophthalmology  -follow up in 3-6 months for repeat A1c

## 2023-06-13 NOTE — Assessment & Plan Note (Signed)
-  BP at goal -continue amlodipine-olmesartan daily, refills provided -follow up in 3-6 months or sooner as appropriate

## 2023-06-13 NOTE — Patient Instructions (Signed)
It was great seeing you today!  Today we discussed your mood which I am glad is doing better and I am very proud of all the hard work you have done to manage this. I have given you refills of cymbalta, please continue to take this daily.  Your blood pressure also looks great and continue to take this as well, I have sent refills in to your pharmacy.  Your A1c is 6.1, this falls into the prediabetes range. Although you do  not have diabetes, we do not want you to develop this in the future. Please eat healthy and stay active as we discussed.   Please follow up at your next scheduled appointment in 3-6 months, if anything arises between now and then, please don't hesitate to contact our office.   Thank you for allowing [place] to be a part of your medical care!  Thank you, Dr. Robyne Peers
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
## 2023-08-11 ENCOUNTER — Ambulatory Visit: Payer: 59 | Admitting: Student
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
## 2023-08-21 ENCOUNTER — Encounter: Payer: Self-pay | Admitting: Student

## 2023-08-21 ENCOUNTER — Ambulatory Visit (INDEPENDENT_AMBULATORY_CARE_PROVIDER_SITE_OTHER): Payer: 59 | Admitting: Student

## 2023-08-21 VITALS — BP 126/81 | HR 61 | Ht 68.0 in | Wt 165.2 lb

## 2023-08-21 DIAGNOSIS — L989 Disorder of the skin and subcutaneous tissue, unspecified: Secondary | ICD-10-CM

## 2023-08-21 DIAGNOSIS — I10 Essential (primary) hypertension: Secondary | ICD-10-CM

## 2023-08-21 DIAGNOSIS — R4586 Emotional lability: Secondary | ICD-10-CM

## 2023-08-21 MED ORDER — AMLODIPINE-OLMESARTAN 5-40 MG PO TABS: 1.0000 | ORAL_TABLET | Freq: Every day | ORAL | 3 refills | Status: DC

## 2023-08-21 MED ORDER — MUPIROCIN 2 % EX OINT: 1.0000 | TOPICAL_OINTMENT | Freq: Two times a day (BID) | CUTANEOUS | 0 refills | Status: AC

## 2023-08-21 NOTE — Assessment & Plan Note (Signed)
Seems to be improving from previous examinations.  She does seem to have benefit from coming in and discussing how she is feeling with her.  Long conversation about coping techniques and reassurance. -Continue Cymbalta at current dose -Follow-up as needed per what benefits patient

## 2023-08-21 NOTE — Assessment & Plan Note (Signed)
Controlled today at 126/81. -Continue amlodipine-olmesartan 5-40 daily -Consider BMP at next visit

## 2023-08-21 NOTE — Patient Instructions (Signed)
It was great to see you! Thank you for allowing me to participate in your care!   Our plans for today:  - I refilled your blood pressure medicines - I am sending in your topical antibiotic for 5 days - See me whenever you need to chat! It was a blessing to meet you!  Take care and seek immediate care sooner if you develop any concerns.  Levin Erp, MD

## 2023-08-21 NOTE — Progress Notes (Signed)
    SUBJECTIVE:   CHIEF COMPLAINT / HPI: Meet PCP  Hypertension: Patient is a 62 y.o. female who present today for follow up of hypertension.   Patient endorses no problems  Home medications include: Amlodipine-olmesartan 5-40 daily Patient endorses taking these medications as prescribed.  Fibromyalgia  Mood changes On duloxetine 60 mg daily, gabapentin 300 twice daily, tizanidine 4 mg nightly.  States that she is going through a lot in terms of her life, sometimes feels lonely.  We discussed the situation including that it has been hard for her that her grandson who is 36 years old has not been keeping in touch with her very much.  Last time that he talked to her it was around May when her birthday was.  She does get emotional and tear up when she thinks about this.  She also states that it has been hard to find any kind of romantic partner.  She states that she has a friend who has metastatic cancer to the brain.  She helped that person by giving her fruits/nice card and some money at one point.  States that that person's boyfriend has gotten in the way of their friendship.  She does feel some therapeutic benefit from being able to speak to someone such as her doctor about her situations.  She does feel like her medications have been helping her.  She states that her faith in God and going to church have helped her a lot as well.     08/21/2023   11:27 AM 06/13/2023    1:57 PM 04/17/2023    1:11 PM 02/03/2023    3:20 PM 12/27/2022   10:54 AM  Depression screen PHQ 2/9  Decreased Interest 1 1 1 1 1   Down, Depressed, Hopeless 1 1 1 1 1   PHQ - 2 Score 2 2 2 2 2   Altered sleeping 1 1 1 1 1   Tired, decreased energy 1 1 1 1 1   Change in appetite 1 1 1 1 1   Feeling bad or failure about yourself  1 1 1 1 1   Trouble concentrating 1 1 1 1 1   Moving slowly or fidgety/restless 1 1 1 1 1   Suicidal thoughts 0 0 0 0 0  PHQ-9 Score 8 8 8 8 8      PERTINENT  PMH / PSH: Migraine, hypertension,  dysphagia, GERD, anxiety, prediabetes  OBJECTIVE:   BP 126/81   Pulse 61   Ht 5\' 8"  (1.727 m)   Wt 165 lb 4 oz (75 kg)   LMP 05/06/2011   BMI 25.13 kg/m   General: Well appearing, NAD, awake, alert, responsive to questions Head: Normocephalic atraumatic Respiratory: chest rises symmetrically,  no increased work of breathing on RA Psych: Mood pleasant, intermittently tearful, does not appear to be responding to any internal stimuli  ASSESSMENT/PLAN:   Mood changes Seems to be improving from previous examinations.  She does seem to have benefit from coming in and discussing how she is feeling with her.  Long conversation about coping techniques and reassurance. -Continue Cymbalta at current dose -Follow-up as needed per what benefits patient  HTN (hypertension) Controlled today at 126/81. -Continue amlodipine-olmesartan 5-40 daily -Consider BMP at next visit   Levin Erp, MD [facility]

## 2023-12-18 ENCOUNTER — Other Ambulatory Visit: Payer: Self-pay

## 2023-12-18 DIAGNOSIS — I10 Essential (primary) hypertension: Secondary | ICD-10-CM

## 2023-12-18 DIAGNOSIS — G8929 Other chronic pain: Secondary | ICD-10-CM

## 2023-12-18 MED ORDER — AMLODIPINE-OLMESARTAN 5-40 MG PO TABS: 1.0000 | ORAL_TABLET | Freq: Every day | ORAL | 3 refills | Status: DC

## 2023-12-18 MED ORDER — GABAPENTIN 300 MG PO CAPS: 300.0000 mg | ORAL_CAPSULE | Freq: Two times a day (BID) | ORAL | 3 refills | Status: DC

## 2024-01-12 ENCOUNTER — Ambulatory Visit: Payer: 59 | Admitting: Student

## 2024-01-12 VITALS — BP 112/74 | HR 74 | Wt 176.2 lb

## 2024-01-12 DIAGNOSIS — K5904 Chronic idiopathic constipation: Secondary | ICD-10-CM | POA: Diagnosis not present

## 2024-01-12 DIAGNOSIS — N898 Other specified noninflammatory disorders of vagina: Secondary | ICD-10-CM

## 2024-01-12 DIAGNOSIS — R7303 Prediabetes: Secondary | ICD-10-CM | POA: Diagnosis not present

## 2024-01-12 LAB — POCT WET PREP (WET MOUNT)
Clue Cells Wet Prep Whiff POC: NEGATIVE
Trichomonas Wet Prep HPF POC: ABSENT

## 2024-01-12 LAB — POCT GLYCOSYLATED HEMOGLOBIN (HGB A1C): HbA1c, POC (controlled diabetic range): 6.4 % (ref 0.0–7.0)

## 2024-01-12 MED ORDER — POLYETHYLENE GLYCOL 3350 17 GM/SCOOP PO POWD: 17.0000 g | Freq: Two times a day (BID) | ORAL | 1 refills | Status: AC | PRN

## 2024-01-12 MED ORDER — SENNA 8.6 MG PO TABS: 1.0000 | ORAL_TABLET | Freq: Every day | ORAL | 0 refills | Status: AC

## 2024-01-12 MED ORDER — METRONIDAZOLE 500 MG PO TABS: 500.0000 mg | ORAL_TABLET | Freq: Two times a day (BID) | ORAL | 0 refills | Status: AC

## 2024-01-12 MED ORDER — TRIAMCINOLONE ACETONIDE 0.5 % EX OINT: 1.0000 | TOPICAL_OINTMENT | Freq: Two times a day (BID) | CUTANEOUS | 0 refills | Status: DC

## 2024-01-12 NOTE — Patient Instructions (Signed)
It was great to see you! Thank you for allowing me to participate in your care!   Our plans for today:  - Ill let you know what your swabs show - I am sending in cream for your thumb - Please take miralax and senna daily for bowel movement, if not improved come back - 1 month f/u for A1c  Take care and seek immediate care sooner if you develop any concerns.  Levin Erp, MD

## 2024-01-12 NOTE — Progress Notes (Unsigned)
    SUBJECTIVE:   CHIEF COMPLAINT / HPI: Stomach problems  Constipation  Left side Went to abthroom it felt better  Colonoscopy with polyps -- states nopt precancerous 3 year follow up  No vaginal discahrge but smells different Itchy down there, no white/clumpy-fish smell No intimate  Cream for thumb  She reports fluctuating blood sugar levels, which she attributes to her diet, specifically the consumption of Oreos. She has been trying to manage her blood sugar by reducing her intake of sweets and eating more greens.  The patient also reports a skin issue that has been present for a month. She believes it is related to her nerves and has been using a cream for it. She also reports abdominal pain and constipation. The abdominal pain is located on her side and has been present since she ate at a restaurant a month ago. She has been managing her constipation with over-the-counter stool softeners and gas relief medication.  Lastly, the patient reports a change in vaginal odor, which she describes as "funky." She denies any sexual activity since 2013 and is unsure if she has any vaginal discharge. She expresses a desire for antibiotics to address this issue.    PERTINENT  PMH / PSH: migraine, htn, GERD, dysphagia  OBJECTIVE:   LMP 05/06/2011   ***  ASSESSMENT/PLAN:   No problem-specific Assessment & Plan notes found for this encounter.     Levin Erp, MD [facility]

## 2024-01-13 NOTE — Assessment & Plan Note (Signed)
A1c at home visit 7.0 -Check A1c here today -Consider starting antiglycemic meds -1 month f/u

## 2024-01-15 ENCOUNTER — Telehealth: Payer: Self-pay | Admitting: Student

## 2024-01-15 NOTE — Telephone Encounter (Signed)
Patient called asking is she could get a letter writing her out of work for at least 3 weeks, States her stomach is still bothering her, but she is taking her medicine. Please call if there are any questions

## 2024-01-16 NOTE — Telephone Encounter (Signed)
Spoke with patient. She stated she is still having the stomach pain. She is taking the antibiotics but wanted to have an appt after she finish with that antibiotics. Made her an appt to be seen on 2/3 at 2:10 with Dr. Laroy Apple. Aquilla Solian, CMA

## 2024-01-26 ENCOUNTER — Ambulatory Visit (INDEPENDENT_AMBULATORY_CARE_PROVIDER_SITE_OTHER): Payer: 59 | Admitting: Student

## 2024-01-26 ENCOUNTER — Encounter: Payer: Self-pay | Admitting: Student

## 2024-01-26 VITALS — BP 106/69 | HR 75 | Ht 68.0 in | Wt 174.0 lb

## 2024-01-26 DIAGNOSIS — K5904 Chronic idiopathic constipation: Secondary | ICD-10-CM

## 2024-01-26 DIAGNOSIS — R4586 Emotional lability: Secondary | ICD-10-CM

## 2024-01-26 NOTE — Patient Instructions (Addendum)
It was great to see you! Thank you for allowing me to participate in your care!   Our plans for today:  - Please start miralax 2 times a day alongside senna. If not improved we could trial linzess - Will get labs today - Recommend set up appointment wit your GI doctor  If you are feeling suicidal or depression symptoms worsen please immediately go to:   If you are thinking about harming yourself or having thoughts of suicide, or if you know someone who is, seek help right away. If you are in crisis, make sure you are not left alone.  If someone else is in crisis, make sure he/she/they is not left alone  Call 988 OR 1-800-273-TALK  24 Hour Availability for Walk-IN services  [facility]  [address] [place], [place] [facility] [place] [phone] Crisis [phone]    Other crisis resources:  [facility] [employer] (Domestic Violence, Rape & Victim Assistance [phone]  [facility] [employer] [facility]    (ONLY from 8am-4pm)    [phone]  [facility] Mobile Crisis Unit (24/7)   [phone]  [place] National Suicide Hotline   [phone] (TALK)   Take care and seek immediate care sooner if you develop any concerns.  Levin Erp, MD

## 2024-01-26 NOTE — Assessment & Plan Note (Signed)
PHQ-9 elevated today.  Denies any suicidal thoughts or ideations.  Discussed therapy resources with patient as well. -Continue duloxetine -Follow-up as needed

## 2024-01-26 NOTE — Progress Notes (Cosign Needed)
    SUBJECTIVE:   CHIEF COMPLAINT / HPI: Stomach pain  Colonoscopy 03/2023- path showed 2 tubular adenoma repeat 3 years recommended  History of chronic constipation, presents with ongoing intermittent abdominal pain. They describe the pain as cramping and spasmodic, located primarily on the right side of the abdomen.  They report a bowel movement today that was soft and not black, with no blood.  Despite this, they continue to experience abdominal discomfort.  Some improvement in abdominal pain after going to the bathroom They have been taking Senna, but not consistently using Miralax.  They also mention a recent episode of vomiting a week ago, but no recurrence since then.  They have been experiencing some stress recently, which they believe may be contributing to their symptoms.    The patient also mentions some depression, for which they are taking duloxetine. They deny any suicidal thoughts.    01/26/2024    2:13 PM 01/12/2024    4:39 PM 08/21/2023   11:27 AM 06/13/2023    1:57 PM 04/17/2023    1:11 PM  Depression screen PHQ 2/9  Decreased Interest 1 1 1 1 1   Down, Depressed, Hopeless 1 1 1 1 1   PHQ - 2 Score 2 2 2 2 2   Altered sleeping 1 1 1 1 1   Tired, decreased energy 1 1 1 1 1   Change in appetite 1 1 1 1 1   Feeling bad or failure about yourself  1 1 1 1 1   Trouble concentrating 1 1 1 1 1   Moving slowly or fidgety/restless 1 1 1 1 1   Suicidal thoughts 0 0 0 0 0  PHQ-9 Score 8 8 8 8 8   Difficult doing work/chores Not difficult at all Somewhat difficult        PERTINENT  PMH / PSH: HTN, GERD, dysphagia  OBJECTIVE:   BP 106/69   Pulse 75   Ht 5\' 8"  (1.727 m)   Wt 174 lb (78.9 kg)   LMP 05/06/2011   SpO2 100%   BMI 26.46 kg/m   General: Well appearing, NAD, awake, alert, responsive to questions Head: Normocephalic atraumatic CV: Regular rate and rhythm no murmurs rubs or gallops Respiratory: Clear to ausculation bilaterally, no wheezes rales or crackles, chest  rises symmetrically,  no increased work of breathing on RA Abdomen: Soft, non-tender, non-distended, normoactive bowel sounds  Extremities: Moves upper and lower extremities freely Neuro: No focal deficits Skin: No rashes or lesions visualized  ASSESSMENT/PLAN:   Assessment & Plan Chronic idiopathic constipation Recent colonoscopy 07/2023 with benign polyps.  Chronic constipation she is only taking senna currently. -Encouraged MiraLAX use twice daily in addition to senna -If not improving could consider Linzess -CMP, CBC -Discussed to follow-up with GI doctor as well Mood changes PHQ-9 elevated today.  Denies any suicidal thoughts or ideations.  Discussed therapy resources with patient as well. -Continue duloxetine -Follow-up as needed   Levin Erp, MD [facility]

## 2024-01-27 LAB — COMPREHENSIVE METABOLIC PANEL
ALT: 18 [IU]/L (ref 0–32)
AST: 19 [IU]/L (ref 0–40)
Albumin: 4.3 g/dL (ref 3.9–4.9)
Alkaline Phosphatase: 87 [IU]/L (ref 44–121)
BUN/Creatinine Ratio: 15 (ref 12–28)
BUN: 13 mg/dL (ref 8–27)
Bilirubin Total: <0.2 mg/dL (ref 0.0–1.2)
CO2: 27 mmol/L (ref 20–29)
Calcium: 10.5 mg/dL — ABNORMAL HIGH (ref 8.7–10.3)
Chloride: 102 mmol/L (ref 96–106)
Creatinine, Ser: 0.86 mg/dL (ref 0.57–1.00)
Globulin, Total: 2.5 g/dL (ref 1.5–4.5)
Glucose: 82 mg/dL (ref 70–99)
Potassium: 4.5 mmol/L (ref 3.5–5.2)
Sodium: 141 mmol/L (ref 134–144)
Total Protein: 6.8 g/dL (ref 6.0–8.5)
eGFR: 77 mL/min/{1.73_m2} (ref >59)

## 2024-01-27 LAB — CBC
Hematocrit: 38.5 % (ref 34.0–46.6)
Hemoglobin: 12.3 g/dL (ref 11.1–15.9)
MCH: 29 pg (ref 26.6–33.0)
MCHC: 31.9 g/dL (ref 31.5–35.7)
MCV: 91 fL (ref 79–97)
Platelets: 375 10*3/uL (ref 150–450)
RBC: 4.24 x10E6/uL (ref 3.77–5.28)
RDW: 13.6 % (ref 11.7–15.4)
WBC: 6.2 10*3/uL (ref 3.4–10.8)

## 2024-01-28 ENCOUNTER — Encounter: Payer: Self-pay | Admitting: Student

## 2024-02-02 ENCOUNTER — Other Ambulatory Visit: Payer: Self-pay | Admitting: Student

## 2024-02-02 DIAGNOSIS — Z1231 Encounter for screening mammogram for malignant neoplasm of breast: Secondary | ICD-10-CM

## 2024-02-02 DIAGNOSIS — R1032 Left lower quadrant pain: Secondary | ICD-10-CM | POA: Diagnosis not present

## 2024-03-03 ENCOUNTER — Telehealth: Payer: Self-pay | Admitting: Student

## 2024-03-03 NOTE — Telephone Encounter (Signed)
 Error

## 2024-03-04 ENCOUNTER — Ambulatory Visit

## 2024-03-04 VITALS — BP 105/75 | HR 69 | Temp 97.4°F | Ht 68.0 in | Wt 173.4 lb

## 2024-03-04 DIAGNOSIS — I10 Essential (primary) hypertension: Secondary | ICD-10-CM

## 2024-03-04 DIAGNOSIS — F419 Anxiety disorder, unspecified: Secondary | ICD-10-CM

## 2024-03-04 DIAGNOSIS — N644 Mastodynia: Secondary | ICD-10-CM | POA: Diagnosis not present

## 2024-03-04 MED ORDER — AMLODIPINE-OLMESARTAN 5-40 MG PO TABS: 1.0000 | ORAL_TABLET | Freq: Every day | ORAL | 3 refills | Status: DC

## 2024-03-04 MED ORDER — DULOXETINE HCL 60 MG PO CPEP: 60.0000 mg | ORAL_CAPSULE | Freq: Every day | ORAL | 3 refills | Status: AC

## 2024-03-04 MED ORDER — NAPROXEN 500 MG PO TABS: 500.0000 mg | ORAL_TABLET | Freq: Two times a day (BID) | ORAL | 0 refills | Status: AC

## 2024-03-04 NOTE — Patient Instructions (Addendum)
 I have ordered an ultrasound of your breast and another diagnostic mammogram.  Please let them know if you are unable to tolerate the mammogram due to pain.  I prescribed a 7-day course of naproxen which is a higher dose of ibuprofen.  He should take this twice a day for the next 7 days.  Please do not take Advil, Motrin, ibuprofen while you are taking this medication.  This can cause stomach irritation so please let me know if you begin to have side effects.  Future Appointments  Date Time Provider Department Center  03/11/2024  9:20 AM [facility] ANNUAL WELLNESS VISIT [facility]  03/17/2024  9:10 AM [facility] DIAG TOM[place] 3 [facility] GI-BREAST [facility]  03/17/2024  9:20 AM [facility] [place] 3 [facility] GI-BREAST CE    Please arrive 15 minutes before your appointment to ensure smooth check in process.    Please call the clinic at [phone] if your symptoms worsen or you have any concerns.  Thank you for allowing me to participate in your care, Dr. Glendale Chard [facility]
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
## 2024-03-11 ENCOUNTER — Ambulatory Visit: Payer: 59

## 2024-03-11 VITALS — Ht 68.0 in | Wt 173.0 lb

## 2024-03-11 DIAGNOSIS — Z0000 Encounter for general adult medical examination without abnormal findings: Secondary | ICD-10-CM | POA: Diagnosis not present
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
## 2024-03-11 NOTE — Patient Instructions (Addendum)
 Megan Kemp , Thank you for taking time to come for your Medicare Wellness Visit. I appreciate your ongoing commitment to your health goals. Please review the following plan we discussed and let me know if I can assist you in the future.   Referrals/Orders/Follow-Ups/Clinician Recommendations:    This is a list of the screening recommended for you and due dates:  Health Maintenance  Topic Date Due   Zoster (Shingles) Vaccine (2 of 2) 04/30/2021   Flu Shot  Never done   COVID-19 Vaccine (3 - 2024-25 season) 08/24/2023   Mammogram  03/10/2025   Medicare Annual Wellness Visit  03/11/2025   DTaP/Tdap/Td vaccine (3 - Td or Tdap) 04/09/2026   Colon Cancer Screening  01/27/2033   Hepatitis C Screening  Completed   HIV Screening  Completed   Pneumococcal Vaccination  Aged Out   HPV Vaccine  Aged Out    Advanced directives: (Declined) Advance directive discussed with you today. Even though you declined this today, please call our office should you change your mind, and we can give you the proper paperwork for you to fill out.  Next Medicare Annual Wellness Visit scheduled for next year: Yes

## 2024-03-12 ENCOUNTER — Ambulatory Visit: Payer: 59

## 2024-03-17 ENCOUNTER — Ambulatory Visit
Admission: RE | Admit: 2024-03-17 | Discharge: 2024-03-17 | Disposition: A | Discharge status: Home / Self Care | Source: Ambulatory Visit | Attending: Family Medicine

## 2024-03-17 DIAGNOSIS — N644 Mastodynia: Secondary | ICD-10-CM

## 2024-03-17 NOTE — Telephone Encounter (Signed)
error 

## 2024-03-18 ENCOUNTER — Encounter: Payer: Self-pay | Admitting: Student

## 2024-05-24 DIAGNOSIS — I729 Aneurysm of unspecified site: Secondary | ICD-10-CM | POA: Diagnosis not present

## 2024-06-01 ENCOUNTER — Encounter: Payer: Self-pay | Admitting: *Deleted

## 2024-06-12 DIAGNOSIS — M7989 Other specified soft tissue disorders: Secondary | ICD-10-CM | POA: Diagnosis not present

## 2024-06-12 DIAGNOSIS — M79605 Pain in left leg: Secondary | ICD-10-CM | POA: Diagnosis not present

## 2024-07-04 ENCOUNTER — Emergency Department (HOSPITAL_COMMUNITY)
Admission: EM | Admit: 2024-07-04 | Discharge: 2024-07-04 | Disposition: A | Discharge status: Home / Self Care | Attending: Emergency Medicine | Admitting: Emergency Medicine

## 2024-07-04 ENCOUNTER — Other Ambulatory Visit: Payer: Self-pay

## 2024-07-04 DIAGNOSIS — R231 Pallor: Secondary | ICD-10-CM | POA: Diagnosis not present

## 2024-07-04 DIAGNOSIS — Z91040 Latex allergy status: Secondary | ICD-10-CM | POA: Diagnosis not present

## 2024-07-04 DIAGNOSIS — Z79899 Other long term (current) drug therapy: Secondary | ICD-10-CM | POA: Insufficient documentation

## 2024-07-04 DIAGNOSIS — I10 Essential (primary) hypertension: Secondary | ICD-10-CM | POA: Diagnosis not present

## 2024-07-04 DIAGNOSIS — R252 Cramp and spasm: Secondary | ICD-10-CM | POA: Diagnosis not present

## 2024-07-04 DIAGNOSIS — R509 Fever, unspecified: Secondary | ICD-10-CM | POA: Diagnosis not present

## 2024-07-04 LAB — CBC WITH DIFFERENTIAL/PLATELET
Abs Immature Granulocytes: 0.02 K/uL (ref 0.00–0.07)
Basophils Absolute: 0 K/uL (ref 0.0–0.1)
Basophils Relative: 0 %
Eosinophils Absolute: 0 K/uL (ref 0.0–0.5)
Eosinophils Relative: 0 %
HCT: 37.4 % (ref 36.0–46.0)
Hemoglobin: 12 g/dL (ref 12.0–15.0)
Immature Granulocytes: 0 %
Lymphocytes Relative: 32 %
Lymphs Abs: 2.1 K/uL (ref 0.7–4.0)
MCH: 29.5 pg (ref 26.0–34.0)
MCHC: 32.1 g/dL (ref 30.0–36.0)
MCV: 91.9 fL (ref 80.0–100.0)
Monocytes Absolute: 0.5 K/uL (ref 0.1–1.0)
Monocytes Relative: 8 %
Neutro Abs: 3.9 K/uL (ref 1.7–7.7)
Neutrophils Relative %: 60 %
Platelets: 344 K/uL (ref 150–400)
RBC: 4.07 MIL/uL (ref 3.87–5.11)
RDW: 14.2 % (ref 11.5–15.5)
WBC: 6.6 K/uL (ref 4.0–10.5)
nRBC: 0 % (ref 0.0–0.2)

## 2024-07-04 LAB — BASIC METABOLIC PANEL WITH GFR
Anion gap: 10 (ref 5–15)
BUN: 23 mg/dL (ref 8–23)
CO2: 26 mmol/L (ref 22–32)
Calcium: 10.2 mg/dL (ref 8.9–10.3)
Chloride: 100 mmol/L (ref 98–111)
Creatinine, Ser: 0.91 mg/dL (ref 0.44–1.00)
GFR, Estimated: >60 mL/min (ref >60)
Glucose, Bld: 104 mg/dL — ABNORMAL HIGH (ref 70–99)
Potassium: 3.7 mmol/L (ref 3.5–5.1)
Sodium: 136 mmol/L (ref 135–145)

## 2024-07-04 MED ORDER — KETOROLAC TROMETHAMINE 30 MG/ML IJ SOLN
30.0000 mg | Freq: Once | INTRAMUSCULAR | Status: AC
  Administered 2024-07-04: 30 mg via INTRAMUSCULAR
  Filled 2024-07-04: qty 1

## 2024-07-04 MED ORDER — ACETAMINOPHEN 500 MG PO TABS
1000.0000 mg | ORAL_TABLET | Freq: Once | ORAL | Status: AC
  Administered 2024-07-04: 1000 mg via ORAL
  Filled 2024-07-04: qty 2

## 2024-07-04 NOTE — ED Provider Notes (Signed)
 [place] EMERGENCY DEPARTMENT AT [facility] Provider Note   CSN: [id number] Arrival date & time: 07/04/24  8187     Patient presents with: Leg Cramps  HPI Megan Kemp is a 63 y.o. female with anxiety and depression, fibromyalgia, hypertension, history of panic attacks presenting for anxiety and bilateral leg cramps.  She states her leg cramps have been going on intermittently for about a month and a half.  Cramping is primarily in the medial and lateral thighs bilaterally.  Still ambulatory.  She states she has been very anxious at home and her cramping seems to be worse with her anxiety.  Denies SI, HI or hallucinations.  She states overall her cramping has eased off since her encounter.  In the ED.  Denies notable swelling or redness in her legs as well.  Denies weakness or numbness.   HPI     Prior to Admission medications   Medication Sig Start Date End Date Taking? Authorizing Provider  amLODipine -olmesartan  (AZOR ) 5-40 MG tablet Take 1 tablet by mouth daily. 03/04/24   Cleotilde Perkins, DO  cyanocobalamin 100 MCG tablet Take 100 mcg by mouth. 12/03/21   [provider]  diclofenac  Sodium (VOLTAREN ) 1 % GEL Apply 4 g topically 4 (four) times daily. 12/06/21   Ganta, Anupa, DO  DULoxetine  (CYMBALTA ) 60 MG capsule Take 1 capsule (60 mg total) by mouth daily. 03/04/24   Cleotilde Perkins, DO  gabapentin  (NEURONTIN ) 300 MG capsule Take 1 capsule (300 mg total) by mouth 2 (two) times daily. 12/18/23   Joshua Domino, DO  melatonin 3 MG TABS tablet Take 1 tablet (3 mg total) by mouth at bedtime. Patient not taking: Reported on 03/07/2023 05/23/22   Cresenzo, Beuford Garcilazo V, MD  Multiple Vitamins-Minerals (ONE-A-DAY WOMENS 50+ ADVANTAGE PO) Take 1 tablet by mouth daily.     [provider]  polyethylene glycol powder (GLYCOLAX /MIRALAX ) 17 GM/SCOOP powder Take 17 g by mouth 2 (two) times daily as needed. 01/12/24   Christia Budds, MD  senna (SENOKOT) 8.6 MG TABS tablet Take 1  tablet (8.6 mg total) by mouth daily. 01/12/24   Christia Budds, MD  tiZANidine  (ZANAFLEX ) 4 MG tablet TAKE 1 TABLET BY MOUTH EVERYDAY AT BEDTIME 10/22/22   Ganta, Anupa, DO  triamcinolone  ointment (KENALOG ) 0.5 % Apply 1 Application topically 2 (two) times daily. 01/12/24   Christia Budds, MD  FLUoxetine  (PROZAC ) 20 MG tablet Take 2 tablets (40 mg total) by mouth daily. 03/13/20 06/25/20  Meccariello, Con PARAS, MD  fluticasone  (FLONASE ) 50 MCG/ACT nasal spray Place 2 sprays into both nostrils daily. 12/02/18 06/25/20  Chrystal Lamarr RAMAN, MD    Allergies: Latex and Codeine    Review of Systems See HPI  Updated Vital Signs BP (!) 148/88   Pulse 90   Temp 99 F (37.2 C)   Resp 18   LMP 05/06/2011   SpO2 100%    Physical Exam   Vitals:   07/04/24 1822 07/04/24 1823  BP: (!) 148/88   Pulse:  90  Resp: 18   Temp: 99 F (37.2 C)   SpO2: 100%     CONSTITUTIONAL:  well-appearing, NAD NEURO:  Alert and oriented x 3, CN 3-12 grossly intact EYES:  eyes equal and reactive ENT/NECK:  Supple, no stridor  CARDIO:  Regular rate and rhythm, appears well-perfused  PULM:  No respiratory distress, CTAB GI/GU:  non-distended MSK/SPINE:  No gross deformities, no edema, moves all extremities Both legs appear grossly normal, no erythema or edema.  Pedal pulses are 2+ bilaterally.  No evidence of trauma to the leg as well.  No calf tenderness with palpation. SKIN:  no rash, atraumatic  *Additional and/or pertinent findings included in MDM below    (all labs ordered are listed, but only abnormal results are displayed) Labs Reviewed  BASIC METABOLIC PANEL WITH GFR - Abnormal; Notable for the following components:      Result Value   Glucose, Bld 104 (*)    All other components within normal limits  CBC WITH DIFFERENTIAL/PLATELET    EKG: None  Radiology: No results found.   Procedures   Medications Ordered in the ED  acetaminophen  (TYLENOL ) tablet 1,000 mg (has no  administration in time range)  ketorolac  (TORADOL ) 30 MG/ML injection 30 mg (has no administration in time range)                                    Medical Decision Making Amount and/or Complexity of Data Reviewed Labs: ordered.   63 year old well-appearing female presenting for leg cramps and anxiety.  Exam was unremarkable.  During my encounter with her she has a very pleasant disposition and does not appear acutely distressed in any way.  I do not feel she is a harm to herself or others.  Her leg cramping is likely chronic given has been going on for about a month.  Treated with Tylenol  and Toradol .  Advised conservative treatment at home.  She also mention she does have a follow-up with her PCP tomorrow.  I strongly advised her to go to that appointment discuss her issues with anxiety and symptoms of panic at times along with her ongoing leg cramping.  Discussed return precautions.  Discharged.      Final diagnoses:  Leg cramps    ED Discharge Orders     None          Jyla Hopf K, PA-C 07/04/24 2113    Patt Alm Macho, MD 07/04/24 [phone]

## 2024-07-04 NOTE — Discharge Instructions (Signed)
 Evaluation was overall reassuring.  Please follow-up with your PCP for your anxiety and moments of panic.  If your symptoms worsen please return for further evaluation.  You can take Tylenol  ibuprofen  at this time for your leg cramping.
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
## 2024-07-05 ENCOUNTER — Ambulatory Visit (INDEPENDENT_AMBULATORY_CARE_PROVIDER_SITE_OTHER)

## 2024-07-05 VITALS — BP 133/87 | HR 64 | Ht 68.0 in | Wt 154.8 lb

## 2024-07-05 DIAGNOSIS — R252 Cramp and spasm: Secondary | ICD-10-CM | POA: Diagnosis not present

## 2024-07-05 DIAGNOSIS — G8929 Other chronic pain: Secondary | ICD-10-CM

## 2024-07-05 DIAGNOSIS — R21 Rash and other nonspecific skin eruption: Secondary | ICD-10-CM | POA: Diagnosis not present

## 2024-07-05 DIAGNOSIS — I10 Essential (primary) hypertension: Secondary | ICD-10-CM

## 2024-07-05 DIAGNOSIS — M2550 Pain in unspecified joint: Secondary | ICD-10-CM | POA: Diagnosis not present

## 2024-07-05 DIAGNOSIS — F410 Panic disorder [episodic paroxysmal anxiety] without agoraphobia: Secondary | ICD-10-CM

## 2024-07-05 DIAGNOSIS — M79643 Pain in unspecified hand: Secondary | ICD-10-CM

## 2024-07-05 DIAGNOSIS — F411 Generalized anxiety disorder: Secondary | ICD-10-CM

## 2024-07-05 MED ORDER — GABAPENTIN 300 MG PO CAPS: 300.0000 mg | ORAL_CAPSULE | Freq: Two times a day (BID) | ORAL | 3 refills | Status: DC

## 2024-07-05 MED ORDER — AMLODIPINE-OLMESARTAN 5-40 MG PO TABS: 1.0000 | ORAL_TABLET | Freq: Every day | ORAL | 3 refills | Status: AC

## 2024-07-05 MED ORDER — HYDROXYZINE HCL 10 MG PO TABS: 10.0000 mg | ORAL_TABLET | Freq: Three times a day (TID) | ORAL | 0 refills | Status: AC | PRN

## 2024-07-05 MED ORDER — TIZANIDINE HCL 4 MG PO TABS
4.0000 mg | ORAL_TABLET | Freq: Four times a day (QID) | ORAL | 1 refills | Status: DC | PRN
Start: 2024-07-05 — End: 2024-08-13

## 2024-07-05 MED ORDER — TRIAMCINOLONE ACETONIDE 0.5 % EX OINT: 1.0000 | TOPICAL_OINTMENT | Freq: Two times a day (BID) | CUTANEOUS | 0 refills | Status: AC

## 2024-07-05 MED ORDER — DICLOFENAC SODIUM 1 % EX GEL: 4.0000 g | Freq: Four times a day (QID) | CUTANEOUS | 6 refills | Status: DC

## 2024-07-05 MED ORDER — MELATONIN 3 MG PO TABS: 3.0000 mg | ORAL_TABLET | Freq: Every day | ORAL | 0 refills | Status: AC

## 2024-07-05 NOTE — Patient Instructions (Addendum)
 Thank you for visiting the clinic today, it was good to see you! In today's visit we discussed:  Panic attacks/anxiety: Continue current medications as prescribed, we are also going to start an as needed hydroxyzine  medication which you should take when you feel the onset of the anxiety/panic attack.  Remember to incorporate exercise (water aerobics, elliptical, treadmill, stationary bike) and community building practices into your daily routine.  Muscle cramps: Start an oral magnesium supplement that you can get over-the-counter at the pharmacy.  Take this once a day, this may help with the cramping pain as well as the exercises listed above.  Bug bite: Apply the triamcinolone  cream 2-3 times a day, for symptomatic relief of itching.  Try to keep the wound covered.  Please follow-up in 4-8 weeks  For any questions, please call the office at [phone] or send me a message in MyChart. Have a great day!  -Fairy Amy, MD  [facility] Family Medicine Resident, PGY-1

## 2024-07-05 NOTE — Assessment & Plan Note (Signed)
 Refilled Azor

## 2024-07-05 NOTE — Assessment & Plan Note (Addendum)
 Refilled cyanocobalamin

## 2024-07-05 NOTE — Assessment & Plan Note (Signed)
 Refilled Kenalog , instructed patient to apply to bug bite for itchiness

## 2024-07-05 NOTE — Progress Notes (Cosign Needed Addendum)
    SUBJECTIVE:   CHIEF COMPLAINT / HPI:   Panic anxiety syndrome: Yesterday had a panic attack at alumni reunion.  Reported palpitations with heavy racing heart beat, and paresthesia starting in the hand and migrating to back and legs associated with painful cramping.  Denied chest pain, shortness of breath, fever, nausea, vomiting.  Went to the ED, and was discharged same day.  Symptoms consistent with previous panic attacks.  Reports having similar episodes about every other week.  She is feeling distant from her family, and feeling used by her grandchildren and neglected from her other sisters.  Her grandmother and sister passed away in the summertime 3 to 4 years ago, and has been feeling very emotional.  Reports she has not been exercising regularly, and has not been going to church because she had an argument with a fellow congregation member.  Weight: Reports her ideal weight is 165, struggle to put on the extra 10 pounds in the last several months.  Denies diminished appetite.  Bug bite: 2 weeks ago reports a black bug bit her leg.  There was swelling, pruritic, and painful.  The swelling has diminished, and erythema has largely receded, but the pain persists.  No pus or drainage, ROS stated above.  PERTINENT  PMH / PSH: Generalized anxiety disorder, fibromyalgia  OBJECTIVE:   Ht 5' 8 (1.727 m)   Wt 154 lb 12.8 oz (70.2 kg)   LMP 05/06/2011   BMI 23.54 kg/m    R Leg: 1 cm excoriation with 5 mm ring of erythema. Dry, black/brown eschar base, healing well. Tender to palpation. Cardiac: Regular rate and rhythm. Normal S1/S2. No murmurs, rubs, or gallops appreciated. Lungs: Clear bilaterally to ascultation.  Psych: Sad and tearful, but reactive to conversation. No SI or HI. Thoughts clear, +anhedonia.   ASSESSMENT/PLAN:   Assessment & Plan Panic anxiety syndrome Poorly controlled, having about 2 episodes of panic attacks a month.  Emotional triggers with family, and community,  due to feelings of intense loneliness. -Start hydroxyzine  10 mg 3 times daily as needed -Continue duloxetine  and as needed tizanidine  for cramping symptoms -Restart water aerobics/low impact aerobic exercises -Encouraged patient to find a new congregation and religious community. -Discussed mindfulness techniques, to work through episodes. Hypertension, unspecified type Refilled Azor  Arthralgia, unspecified joint Refilled Voltaren  gel -Encouraged to try daily magnesium supplements for leg cramps Chronic hand pain, unspecified laterality Refilled cyanocobalamin Rash Refilled Kenalog , instructed patient to apply to bug bite for itchiness Cramp in limb Likely Secondary to panic attacks, beer's list reviewed. -Refilled Tizanidine  -Encouraged to try daily magnesium supplements for leg cramps     Fairy Amy, MD [facility]

## 2024-07-12 DIAGNOSIS — R1032 Left lower quadrant pain: Secondary | ICD-10-CM | POA: Diagnosis not present

## 2024-07-19 ENCOUNTER — Ambulatory Visit (INDEPENDENT_AMBULATORY_CARE_PROVIDER_SITE_OTHER): Admitting: Podiatry

## 2024-07-19 ENCOUNTER — Encounter: Payer: Self-pay | Admitting: Podiatry

## 2024-07-19 ENCOUNTER — Other Ambulatory Visit: Payer: Self-pay | Admitting: Podiatry

## 2024-07-19 DIAGNOSIS — M79672 Pain in left foot: Secondary | ICD-10-CM

## 2024-07-19 DIAGNOSIS — B351 Tinea unguium: Secondary | ICD-10-CM

## 2024-07-19 DIAGNOSIS — M79671 Pain in right foot: Secondary | ICD-10-CM

## 2024-07-19 MED ORDER — TERBINAFINE HCL 250 MG PO TABS: 250.0000 mg | ORAL_TABLET | Freq: Every day | ORAL | 0 refills | Status: AC

## 2024-07-19 NOTE — Addendum Note (Signed)
 Addended by: GERRIT ANDREZ CROME on: 07/19/2024 10:23 AM   Modules accepted: Orders

## 2024-07-19 NOTE — Progress Notes (Signed)
   Subjective:    Patient presents with complaint of thick discolored dystrophic nails 1 through 5 bilaterally.  They have been getting thicker and more painful over the past several years.  Has tried some over-the-counter remedy without success.  Tenderness around toenails with walking or    Objective:  Physical Exam   General: AAO x3, NAD  Vascular: DP and PT pulses palpable bilaterally.  Immedate capillary fill time digits. No significant lower extremity edema bilaterally.  Capillary refill time immediate bilateral  Dermatological: Onychomycotic mycotic changes nails 1 through 5 with discoloration nail and subungual debris and thickening of the nail.  Normal skin texture.  Neruologic: Grossly intact B/L.  Normal Achilles tendon reflex bilaterally.  Musculoskeletal: Mild hammertoe deformities bilaterally  Assessment:  Painful onychomycotic nails 1 through 5 bilaterally. Pain feet b/l     Plan:  - New office visit level 3 for evaluation and management - Discussed with the patient onychomycosis and etiology and treatment.  Discussed topical versus oral treatment.  Discussed risk and benefits of both.  Patient would like to pursue oral treatment with Lamisil .  No history of liver or kidney disease. -Rx: Lamisil  250 mg p.o. daily - Labs ordered today for liver function tests to monitor for any hepatic side effects from the Lamisil .  - Return in 4 weeks for  Lamisil  2

## 2024-07-20 LAB — HEPATIC FUNCTION PANEL
ALT: 22 IU/L (ref 0–32)
AST: 17 IU/L (ref 0–40)
Albumin: 4.2 g/dL (ref 3.9–4.9)
Alkaline Phosphatase: 83 IU/L (ref 44–121)
Bilirubin Total: 0.2 mg/dL (ref 0.0–1.2)
Bilirubin, Direct: <0.08 mg/dL (ref 0.00–0.40)
Total Protein: 6.5 g/dL (ref 6.0–8.5)

## 2024-08-10 ENCOUNTER — Ambulatory Visit (INDEPENDENT_AMBULATORY_CARE_PROVIDER_SITE_OTHER): Admitting: Family Medicine

## 2024-08-10 VITALS — BP 104/64 | HR 68 | Wt 156.0 lb

## 2024-08-10 DIAGNOSIS — L299 Pruritus, unspecified: Secondary | ICD-10-CM | POA: Diagnosis not present

## 2024-08-10 MED ORDER — HYDROCORTISONE 2.5 % EX OINT: TOPICAL_OINTMENT | Freq: Two times a day (BID) | CUTANEOUS | 0 refills | Status: AC

## 2024-08-10 NOTE — Patient Instructions (Signed)
 We collected labs today.  I have sent in hydrocortisone  cream to help with the itchiness of the bug bite.  Take zyrtec  or claritin over the counter to help with itchiness of the areas. Be careful while on these as they can make you dizzy sometimes and predispose to falls.

## 2024-08-10 NOTE — Progress Notes (Signed)
    SUBJECTIVE:   CHIEF COMPLAINT / HPI:   Reaction to shingles vaccine Has itchiness and burning all over her body a couple hours after the vaccine. She has tried some calamine lotion which helped, but this did not resolve the symptoms. She has showered multiple times. She did recently start terbinafine  but she only took one dose and her symptoms started before she took this. Never had this before. No new soaps or detergents. She also has one area where a big bite was that is itchy.  PERTINENT  PMH / PSH: prurigo nodularis  OBJECTIVE:   BP 104/64   Pulse 68   Wt 156 lb (70.8 kg)   LMP 05/06/2011   SpO2 98%   BMI 23.72 kg/m   General: Alert and oriented, in NAD Skin: Warm, dry, and intact; mild skin-colored papules over right lower back with evidence of excoriation; no other papules or lesions identified over the body/areas of concern; one 2 cm well-healed lesion with peripheral hyperpigmentation and without discharge/erythema HEENT: NCAT, EOM grossly normal, midline nasal septum Respiratory: Breathing and speaking comfortably on RA Extremities: Moves all extremities grossly equally Neurological: No gross focal deficit Psychiatric: Appropriate mood and affect   ASSESSMENT/PLAN:   Assessment & Plan Pruritus Unlikely due to shingles vaccine. No signs of dermatomal, vesicular, erythematous rash today suggestive of shingles. 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 We will go ahead and further evaluate with CBC, CMP, TSH. Will also send in hydrocortisone  2.5% to be applied to the healed bug bite with some hyperpigmentation. Discussed side effects of topical steroids. Also recommended second generation H1 blockers for pruritus and discussed potential dizzying effects of these medications. She will keep me updated on improvement.   Stuart Redo, MD [facility]

## 2024-08-11 DIAGNOSIS — L299 Pruritus, unspecified: Secondary | ICD-10-CM | POA: Diagnosis not present

## 2024-08-12 LAB — CBC WITH DIFFERENTIAL/PLATELET
Basophils Absolute: 0 x10E3/uL (ref 0.0–0.2)
Basos: 0 %
EOS (ABSOLUTE): 0.1 x10E3/uL (ref 0.0–0.4)
Eos: 3 %
Hematocrit: 36.1 % (ref 34.0–46.6)
Hemoglobin: 11.6 g/dL (ref 11.1–15.9)
Immature Grans (Abs): 0 x10E3/uL (ref 0.0–0.1)
Immature Granulocytes: 0 %
Lymphocytes Absolute: 1.9 x10E3/uL (ref 0.7–3.1)
Lymphs: 41 %
MCH: 29.7 pg (ref 26.6–33.0)
MCHC: 32.1 g/dL (ref 31.5–35.7)
MCV: 92 fL (ref 79–97)
Monocytes Absolute: 0.3 x10E3/uL (ref 0.1–0.9)
Monocytes: 6 %
Neutrophils Absolute: 2.3 x10E3/uL (ref 1.4–7.0)
Neutrophils: 50 %
Platelets: 382 x10E3/uL (ref 150–450)
RBC: 3.91 x10E6/uL (ref 3.77–5.28)
RDW: 13.9 % (ref 11.7–15.4)
WBC: 4.7 x10E3/uL (ref 3.4–10.8)

## 2024-08-12 LAB — COMPREHENSIVE METABOLIC PANEL WITH GFR
ALT: 12 IU/L (ref 0–32)
AST: 15 IU/L (ref 0–40)
Albumin: 4.4 g/dL (ref 3.9–4.9)
Alkaline Phosphatase: 81 IU/L (ref 44–121)
BUN/Creatinine Ratio: 16 (ref 12–28)
BUN: 14 mg/dL (ref 8–27)
Bilirubin Total: 0.2 mg/dL (ref 0.0–1.2)
CO2: 25 mmol/L (ref 20–29)
Calcium: 10.2 mg/dL (ref 8.7–10.3)
Chloride: 105 mmol/L (ref 96–106)
Creatinine, Ser: 0.89 mg/dL (ref 0.57–1.00)
Globulin, Total: 2.5 g/dL (ref 1.5–4.5)
Glucose: 76 mg/dL (ref 70–99)
Potassium: 4.6 mmol/L (ref 3.5–5.2)
Sodium: 143 mmol/L (ref 134–144)
Total Protein: 6.9 g/dL (ref 6.0–8.5)
eGFR: 73 mL/min/1.73 (ref >59)

## 2024-08-12 LAB — TSH: TSH: 0.255 u[IU]/mL — ABNORMAL LOW (ref 0.450–4.500)

## 2024-08-13 ENCOUNTER — Other Ambulatory Visit: Payer: Self-pay

## 2024-08-13 ENCOUNTER — Ambulatory Visit: Payer: Self-pay | Admitting: Family Medicine

## 2024-08-13 DIAGNOSIS — F410 Panic disorder [episodic paroxysmal anxiety] without agoraphobia: Secondary | ICD-10-CM

## 2024-08-13 DIAGNOSIS — R7989 Other specified abnormal findings of blood chemistry: Secondary | ICD-10-CM

## 2024-08-13 NOTE — Progress Notes (Signed)
 Discussed normal CBC and CMP with patient.  We also discussed her mildly low TSH at 0.255.  She will come back on Monday 8/26 at 11:45 AM for repeat TSH with reflex to T4 on abnormal.  Patient has reassuringly improved on antihistamine and hydrocortisone  cream.

## 2024-08-16 ENCOUNTER — Other Ambulatory Visit: Payer: Self-pay

## 2024-08-16 DIAGNOSIS — R7989 Other specified abnormal findings of blood chemistry: Secondary | ICD-10-CM

## 2024-08-17 ENCOUNTER — Ambulatory Visit: Payer: Self-pay | Admitting: Family Medicine

## 2024-08-17 LAB — TSH RFX ON ABNORMAL TO FREE T4: TSH: 0.486 u[IU]/mL (ref 0.450–4.500)

## 2024-08-17 NOTE — Progress Notes (Signed)
 Repeat TSH normal. Repeat as clinically indicated given patient as improved with symptomatic treatment.

## 2024-08-22 ENCOUNTER — Other Ambulatory Visit: Payer: Self-pay | Admitting: Podiatry

## 2024-12-22 ENCOUNTER — Telehealth: Payer: Self-pay

## 2024-12-22 NOTE — Telephone Encounter (Signed)
 Received VM from patient regarding rx on amlodipine -olmesartan .   She states that she needs a refill.   Per chart review, she should have refills available at the pharmacy.   Called [facility]. Medication was on hold from previous attempt to run too soon.   They will work on getting this ready for patient this afternoon.   Called patient and provided with update.   Patient appreciative.   Chiquita JAYSON English, RN

## 2025-01-04 ENCOUNTER — Ambulatory Visit: Admitting: Family Medicine

## 2025-01-04 ENCOUNTER — Encounter: Payer: Self-pay | Admitting: Family Medicine

## 2025-01-04 VITALS — BP 112/70 | HR 60 | Ht 68.0 in | Wt 167.4 lb

## 2025-01-04 DIAGNOSIS — M19042 Primary osteoarthritis, left hand: Secondary | ICD-10-CM | POA: Diagnosis not present

## 2025-01-04 DIAGNOSIS — M19041 Primary osteoarthritis, right hand: Secondary | ICD-10-CM

## 2025-01-04 MED ORDER — DICLOFENAC SODIUM 50 MG PO TBEC: 50.0000 mg | DELAYED_RELEASE_TABLET | Freq: Two times a day (BID) | ORAL | 0 refills | Status: AC

## 2025-01-04 NOTE — Progress Notes (Cosign Needed Addendum)
" ° ° °  SUBJECTIVE:   CHIEF COMPLAINT / HPI:   Patient presents for acute flareup of her arthritis.  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  Has not tried anything for this but would like to trial medication.  Reports the pain is worse with movement and better with rest.  Denies any pain first thing in the morning.  Also  just found out her niece passed away, this is third family member in the last few months. She is very anxious   PERTINENT  PMH / PSH: reviewed   OBJECTIVE:   BP 112/70   Pulse 60   Ht 5' 8 (1.727 m)   Wt 167 lb 6.4 oz (75.9 kg)   LMP 05/06/2011   SpO2 98%   BMI 25.45 kg/m   General: A&O, NAD Respiratory: normal WOB GI: non-distended  MSK: swelling and stiffness noted on bilateral distal interphalangeal joints in fingers 2-5. No proximal or metacarpophalangeal swelling appreciated    ASSESSMENT/PLAN:   Assessment & Plan Arthritis of both hands Patient with history of arthritis diagnosed via xray. Today on exam, can appreciate swelling surrounding multiple joints in bilateral fingers. Likely exacerbated with recent activity. - diclofenac  50 mg BID for 2 weeks - exercises given - 2 week follow up    Of note,question 9 on phq-9 was positive. Discussed with patient who reported it was by error and reports no active or passive thoughts of suicide or self harm  Gloriann Ogren, MD [facility] "

## 2025-01-04 NOTE — Patient Instructions (Addendum)
 It was wonderful to see you today.  Please bring ALL of your medications with you to every visit.   Today we talked about:  I am ordering medicine for you take twice daily for two weeks. Please ice your wrist for the carpal tunnel  Thank you for choosing [facility].   Please call [phone] with any questions about today's appointment.  Please arrive at least 15 minutes prior to your scheduled appointments.   If you had blood work today, I will send you a MyChart message or a letter if results are normal. Otherwise, I will give you a call.   If you had a referral placed, they will call you to set up an appointment. Please give us  a call if you don't hear back in the next 2 weeks.   If you need additional refills before your next appointment, please call your pharmacy first.   Do you need your medications delivered to your home?   Well send your prescription to the [facility] for delivery.          Address: [address] [place], [place], [place] [address]          Phone: [phone]  Please call the [facility] to speak with a pharmacist and set up your home medication delivery. If you have any questions, feel free to contact us  -- were happy to help!  Other [facility] that offer affordable prices on both prescriptions and over-the-counter items, as well as convenient services like vaccinations, are  [facility], at [facility]         Address:  [address], [place], [place] [address]         Phone: [phone]  [facility], located in the [facility]        Address: [address] [place], [place] [address]        Phone: [phone]  [facility], at [facility]       Address: [address], [place], [place] [address]       Phone: [phone]  [facility], at [facility]       Address:  [address] Floor, [place], [place] [address]       Phone: [phone]  You should follow up in our clinic in 2 weeks  Gloriann Ogren, MD Family Medicine

## 2025-01-20 ENCOUNTER — Ambulatory Visit: Payer: Self-pay

## 2025-01-20 VITALS — BP 124/84 | HR 79 | Ht 68.0 in | Wt 169.4 lb

## 2025-01-20 DIAGNOSIS — M19042 Primary osteoarthritis, left hand: Secondary | ICD-10-CM | POA: Diagnosis not present

## 2025-01-20 DIAGNOSIS — M5410 Radiculopathy, site unspecified: Secondary | ICD-10-CM | POA: Diagnosis not present

## 2025-01-20 DIAGNOSIS — M5412 Radiculopathy, cervical region: Secondary | ICD-10-CM

## 2025-01-20 DIAGNOSIS — F330 Major depressive disorder, recurrent, mild: Secondary | ICD-10-CM | POA: Diagnosis not present

## 2025-01-20 DIAGNOSIS — M19041 Primary osteoarthritis, right hand: Secondary | ICD-10-CM

## 2025-01-20 DIAGNOSIS — F411 Generalized anxiety disorder: Secondary | ICD-10-CM

## 2025-01-20 DIAGNOSIS — M79643 Pain in unspecified hand: Secondary | ICD-10-CM

## 2025-01-20 DIAGNOSIS — G8929 Other chronic pain: Secondary | ICD-10-CM | POA: Diagnosis not present

## 2025-01-20 MED ORDER — GABAPENTIN 300 MG PO CAPS: 300.0000 mg | ORAL_CAPSULE | Freq: Three times a day (TID) | ORAL | 3 refills | Status: AC

## 2025-01-20 MED ORDER — DICLOFENAC SODIUM 50 MG PO TBEC: 50.0000 mg | DELAYED_RELEASE_TABLET | Freq: Two times a day (BID) | ORAL | 0 refills | Status: AC

## 2025-01-20 NOTE — Patient Instructions (Addendum)
 For your nerve pain we have increased your gabapentin  to 3 times a day. I have sent in a new prescription to your pharmacy.  For your hand arthritis pain, I have sent in another 2 week supply of the diclofenac . Please come back in 2 weeks so we can check on your hand pain.  I have also placed a referral for physical therapy. They should call you in 1-2 weeks to make an appointment. If you do not hear from them in 2 weeks, give us  a call.  Below is a list of resources for counseling. You can call them and see if they take your insurance. Or call the number on the back of your insurance card and ask where in the area takes your insurance.   Psychiatry Resource List (Adults and Children) Most of these providers will take Medicaid. please consult your insurance for a complete and updated list of available providers. When calling to make an appointment have your insurance information available to confirm you are covered.   [facility]:Psychiatry and Counseling [address] [place], [place] [address] [phone]  [facility]  [address] [place], [place] [phone] Crisis [phone]   [facility]:   [facility]: [address]     [phone]   [place]: [address] [place],        [phone] [place]: [phone],    [phone] [place]: [phone],                   [phone] Children: [facility] [address]         [phone]  [facility] (virtual only) [phone]   [facility]  (Psychiatry only; Adults /children 12 and over, will take Medicaid)  [address] [place] [address], [place], [place] [address]       [phone]   [facility] (Psychiatry & counseling ; adults & children ; will take Medicaid [address]  [place] [address]  Go on-line to complete referral (  [email or web address]://[email or web address]/en/make-[email or web address] [phone]    (Spanish speaking therapists)  [facility]  Psychiatry & counseling; Adults and children;  Call Registration prior to scheduling an appointment [phone] [address]    [place], [place] [address]    [phone]  [facility] (Psychiatry & counseling; adults & children; Medicare no Medicaid)  [address]   [place], [place]  [address]      [phone]    [facility] (up to age 84)  Psychiatry & counseling ,will take Medicaid, must do counseling to receive psychiatry services  [address] [place] [address]        [phone]  [facility] (Psychiatry & counseling; adults & children; will take Medicaid) Will need a referral from provider [address],  [place], [place]  [phone]   [facility] --- Walk-In Mon-Friday 8am-3pm ( will take Medicaid, Psychiatry, Adults & children,  [address] [place], [place]   [phone]   [facility]--, Walk-in M-F 8am-12pm and 1pm -3pm   (Counseling, Psychiatry, will take Medicaid, adults & children)  [address] [place], [place]  [phone]

## 2025-01-20 NOTE — Assessment & Plan Note (Signed)
 Zero on PHQ-9, but patient tearful on exam and expressing multiple current life stressors. Patient denies thoughts of SI.  - Counseling resources given

## 2025-01-20 NOTE — Progress Notes (Signed)
" ° ° °  SUBJECTIVE:   CHIEF COMPLAINT / HPI: f/u arthritis/multiple   F/u Athritis Patient is follow-up after being seen 2 weeks ago for an OA flare in her hands bilaterally. She states the swelling has gotten better, but the pain is still there. The diclofenac  has been helping her pain.   Leg and arm pain  Patient states she has pain in her arms and legs bilaterally that is throbbing. She states it is sometimes numb and tingly. Her arm pain starts at her neck and her leg pain starts at her lower back. She previously saw physical therapy and found it helpful. She would like to go again.   Mood Patient is tearful at the start of the visit. She endorses multiple stressors including lost paperwork and her niece passing. She previously did counseling, but states it has been some time. She found this helpful and would like get started again.   PERTINENT  PMH / PSH: History of cerebral aneurysm (2018), Depression, Generalized anxiety  OBJECTIVE:   BP 124/84   Pulse 79   Ht 5' 8 (1.727 m)   Wt 169 lb 6.4 oz (76.8 kg)   LMP 05/06/2011   SpO2 100%   BMI 25.76 kg/m   General: tearful, NAD  Cardiovascular: RRR, no m/r/g  Respiratory: CTAB, normal work of breathing on room air  Extremities: DIPs on digits 2-5 bilaterally with swelling, (less than prior according to patient report), some stiffness, but able to fully flex fingers   ASSESSMENT/PLAN:   Assessment & Plan Osteoarthritis of both hands, unspecified osteoarthritis type - diclofenac  50 mg BID for 2 weeks - patient counseled on risks/benefits  - can consider diclofenac  gel in the future, deferred today 2/2 financial strain  Mild episode of recurrent major depressive disorder GAD (generalized anxiety disorder) Zero on PHQ-9, but patient tearful on exam and expressing multiple current life stressors. Patient denies thoughts of SI.  - Counseling resources given Radicular pain of both lower extremities Cervical radiculopathy Throbbing  pain with numbness and tingling. Neck pain that radiates down upper extremities bilaterally. Lumbar pain that radiates down lower extremities bilaterally. Currently on gabapentin  300 mg BID for sciatica.  - Increased gabapentin  to 300 mg TID - Referral for PT placed      Raguel KANDICE Lee, DO [facility] "
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
## 2025-02-02 ENCOUNTER — Ambulatory Visit: Admitting: Physical Therapy

## 2025-02-02 ENCOUNTER — Ambulatory Visit: Admitting: Occupational Therapy

## 2025-02-10 ENCOUNTER — Encounter
